# Patient Record
Sex: Female | Born: 1965
Health system: Southern US, Community
[De-identification: ages and names within clinical notes are randomized; demographics above are authoritative.]

## PROBLEM LIST (undated history)

## (undated) DIAGNOSIS — G5600 Carpal tunnel syndrome, unspecified upper limb: Secondary | ICD-10-CM

## (undated) DIAGNOSIS — I10 Essential (primary) hypertension: Secondary | ICD-10-CM

## (undated) DIAGNOSIS — R7301 Impaired fasting glucose: Secondary | ICD-10-CM

## (undated) DIAGNOSIS — N3281 Overactive bladder: Secondary | ICD-10-CM

## (undated) DIAGNOSIS — E559 Vitamin D deficiency, unspecified: Secondary | ICD-10-CM

## (undated) DIAGNOSIS — M109 Gout, unspecified: Secondary | ICD-10-CM

## (undated) DIAGNOSIS — M1990 Unspecified osteoarthritis, unspecified site: Secondary | ICD-10-CM

## (undated) DIAGNOSIS — Z9289 Personal history of other medical treatment: Secondary | ICD-10-CM

## (undated) DIAGNOSIS — F419 Anxiety disorder, unspecified: Secondary | ICD-10-CM

## (undated) DIAGNOSIS — IMO0001 Reserved for inherently not codable concepts without codable children: Secondary | ICD-10-CM

## (undated) DIAGNOSIS — Z973 Presence of spectacles and contact lenses: Secondary | ICD-10-CM

## (undated) HISTORY — DX: Vitamin D deficiency, unspecified: E55.9

## (undated) HISTORY — DX: Impaired fasting glucose: R73.01

## (undated) HISTORY — PX: TOENAIL EXCISION: SUR558

## (undated) HISTORY — DX: Reserved for inherently not codable concepts without codable children: IMO0001

## (undated) HISTORY — DX: Carpal tunnel syndrome, unspecified upper limb: G56.00

## (undated) HISTORY — DX: Personal history of other medical treatment: Z92.89

## (undated) HISTORY — DX: Presence of spectacles and contact lenses: Z97.3

## (undated) HISTORY — DX: Anxiety disorder, unspecified: F41.9

## (undated) HISTORY — PX: WISDOM TOOTH EXTRACTION: SHX21

## (undated) HISTORY — DX: Overactive bladder: N32.81

## (undated) HISTORY — DX: Essential (primary) hypertension: I10

---

## 1997-07-20 ENCOUNTER — Encounter: Admission: RE | Admit: 1997-07-20 | Discharge: 1997-07-20 | Payer: Self-pay | Admitting: Family Medicine

## 1997-08-22 ENCOUNTER — Encounter: Admission: RE | Admit: 1997-08-22 | Discharge: 1997-08-22 | Payer: Self-pay | Admitting: Family Medicine

## 1997-09-20 ENCOUNTER — Encounter: Admission: RE | Admit: 1997-09-20 | Discharge: 1997-09-20 | Payer: Self-pay | Admitting: Family Medicine

## 1997-12-08 ENCOUNTER — Encounter: Admission: RE | Admit: 1997-12-08 | Discharge: 1997-12-08 | Payer: Self-pay | Admitting: Family Medicine

## 1998-01-24 ENCOUNTER — Encounter: Admission: RE | Admit: 1998-01-24 | Discharge: 1998-01-24 | Payer: Self-pay | Admitting: Family Medicine

## 1998-05-30 ENCOUNTER — Encounter: Admission: RE | Admit: 1998-05-30 | Discharge: 1998-05-30 | Payer: Self-pay | Admitting: Family Medicine

## 1998-09-05 ENCOUNTER — Encounter: Admission: RE | Admit: 1998-09-05 | Discharge: 1998-09-05 | Payer: Self-pay | Admitting: Family Medicine

## 1998-10-12 ENCOUNTER — Encounter: Admission: RE | Admit: 1998-10-12 | Discharge: 1998-10-12 | Payer: Self-pay | Admitting: Family Medicine

## 1998-12-17 ENCOUNTER — Encounter: Admission: RE | Admit: 1998-12-17 | Discharge: 1998-12-17 | Payer: Self-pay | Admitting: Family Medicine

## 2001-05-25 ENCOUNTER — Encounter: Admission: RE | Admit: 2001-05-25 | Discharge: 2001-05-25 | Payer: Self-pay | Admitting: Family Medicine

## 2002-12-02 ENCOUNTER — Ambulatory Visit (HOSPITAL_COMMUNITY): Admission: RE | Admit: 2002-12-02 | Discharge: 2002-12-02 | Payer: Self-pay | Admitting: Obstetrics

## 2002-12-02 ENCOUNTER — Encounter: Payer: Self-pay | Admitting: Obstetrics

## 2003-07-22 ENCOUNTER — Emergency Department (HOSPITAL_COMMUNITY): Admission: EM | Admit: 2003-07-22 | Discharge: 2003-07-22 | Payer: Self-pay | Admitting: Emergency Medicine

## 2004-02-26 ENCOUNTER — Emergency Department (HOSPITAL_COMMUNITY): Admission: EM | Admit: 2004-02-26 | Discharge: 2004-02-26 | Payer: Self-pay | Admitting: Emergency Medicine

## 2005-01-02 ENCOUNTER — Ambulatory Visit: Payer: Self-pay | Admitting: Family Medicine

## 2005-01-10 ENCOUNTER — Encounter (INDEPENDENT_AMBULATORY_CARE_PROVIDER_SITE_OTHER): Payer: Self-pay | Admitting: *Deleted

## 2005-01-10 LAB — CONVERTED CEMR LAB

## 2005-06-05 ENCOUNTER — Ambulatory Visit (HOSPITAL_COMMUNITY): Admission: RE | Admit: 2005-06-05 | Discharge: 2005-06-05 | Payer: Self-pay | Admitting: Obstetrics

## 2006-06-05 ENCOUNTER — Encounter (INDEPENDENT_AMBULATORY_CARE_PROVIDER_SITE_OTHER): Payer: Self-pay | Admitting: *Deleted

## 2007-05-19 ENCOUNTER — Ambulatory Visit (HOSPITAL_COMMUNITY): Admission: RE | Admit: 2007-05-19 | Discharge: 2007-05-19 | Payer: Self-pay | Admitting: Obstetrics

## 2008-07-13 ENCOUNTER — Ambulatory Visit (HOSPITAL_COMMUNITY): Admission: RE | Admit: 2008-07-13 | Discharge: 2008-07-13 | Payer: Self-pay | Admitting: Obstetrics

## 2008-10-07 ENCOUNTER — Emergency Department (HOSPITAL_COMMUNITY): Admission: EM | Admit: 2008-10-07 | Discharge: 2008-10-07 | Payer: Self-pay | Admitting: Family Medicine

## 2008-10-20 ENCOUNTER — Ambulatory Visit: Payer: Self-pay | Admitting: Internal Medicine

## 2008-10-25 DIAGNOSIS — I10 Essential (primary) hypertension: Secondary | ICD-10-CM

## 2008-10-25 HISTORY — DX: Essential (primary) hypertension: I10

## 2008-11-08 ENCOUNTER — Ambulatory Visit: Payer: Self-pay | Admitting: Internal Medicine

## 2009-05-18 ENCOUNTER — Ambulatory Visit: Payer: Self-pay | Admitting: Internal Medicine

## 2009-07-17 ENCOUNTER — Ambulatory Visit (HOSPITAL_COMMUNITY): Admission: RE | Admit: 2009-07-17 | Discharge: 2009-07-17 | Payer: Self-pay | Admitting: Obstetrics and Gynecology

## 2009-09-05 ENCOUNTER — Encounter (HOSPITAL_COMMUNITY): Payer: Self-pay | Admitting: Obstetrics and Gynecology

## 2009-09-05 ENCOUNTER — Inpatient Hospital Stay (HOSPITAL_COMMUNITY): Admission: RE | Admit: 2009-09-05 | Discharge: 2009-09-07 | Payer: Self-pay | Admitting: Obstetrics and Gynecology

## 2009-12-03 ENCOUNTER — Telehealth: Payer: Self-pay | Admitting: Internal Medicine

## 2009-12-07 ENCOUNTER — Ambulatory Visit: Payer: Self-pay | Admitting: Internal Medicine

## 2010-04-28 ENCOUNTER — Encounter: Payer: Self-pay | Admitting: Obstetrics

## 2010-05-07 NOTE — Progress Notes (Signed)
Summary: Refill--Lisinopril  Phone Note Refill Request Message from:  Fax from Pharmacy on December 03, 2009 3:44 PM  Refills Requested: Medication #1:  LISINOPRIL-HYDROCHLOROTHIAZIDE 20-12.5 MG TABS once daily for high blood pressure. Initial call taken by: Lucious Groves CMA,  December 03, 2009 3:44 PM    Prescriptions: LISINOPRIL-HYDROCHLOROTHIAZIDE 20-12.5 MG TABS (LISINOPRIL-HYDROCHLOROTHIAZIDE) once daily for high blood pressure  #30 x 0   Entered by:   Lucious Groves CMA   Authorized by:   Etta Grandchild MD   Signed by:   Lucious Groves CMA on 12/03/2009   Method used:   Electronically to        [facility] (retail)       [address]       [place], [place]  [address]       Ph: [phone] or [phone]       Fax: [phone]   RxID:   [id number]

## 2010-05-07 NOTE — Assessment & Plan Note (Signed)
Summary: elev bp / offered sda / cd   Vital Signs:  Patient profile:   45 year old female Menstrual status:  regular Height:      67 inches Weight:      227 pounds BMI:     35.68 O2 Sat:      98 % on Room air Temp:     98.3 degrees F oral Pulse rate:   72 / minute Pulse rhythm:   regular Resp:     16 per minute BP sitting:   139 / 82  (left arm) Cuff size:   large  Vitals Entered By: Bill Salinas CMA (December 07, 2009 8:11 AM)  Nutrition Counseling: Patient's BMI is greater than 25 and therefore counseled on weight management options.  O2 Flow:  Room air CC: pt here for follow up on elevated BP/ ab Comments Pt is due for a tetanus and she needs a refill on her lisinopril/ ab   Primary Care Leslea Vowles:  Etta Grandchild MD  CC:  pt here for follow up on elevated BP/ ab.  History of Present Illness:  Hypertension Follow-Up      This is a 45 year old woman who presents for Hypertension follow-up.  The patient denies lightheadedness, urinary frequency, headaches, edema, rash, and fatigue.  The patient denies the following associated symptoms: chest pain, chest pressure, exercise intolerance, dyspnea, palpitations, syncope, leg edema, and pedal edema.  Compliance with medications (by patient report) has been near 100%.  The patient reports that dietary compliance has been good.  The patient reports exercising occasionally.  Adjunctive measures currently used by the patient include salt restriction and relaxation.    Preventive Screening-Counseling & Management  Alcohol-Tobacco     Alcohol drinks/day: 0     Smoking Status: never     Tobacco Counseling: not indicated; no tobacco use  Hep-HIV-STD-Contraception     Hepatitis Risk: no risk noted     HIV Risk: no risk noted     STD Risk: no risk noted  Medications Prior to Update: 1)  Lisinopril-Hydrochlorothiazide 20-12.5 Mg Tabs (Lisinopril-Hydrochlorothiazide) .... Once Daily For High Blood Pressure  Current Medications  (verified): 1)  Lisinopril-Hydrochlorothiazide 20-12.5 Mg Tabs (Lisinopril-Hydrochlorothiazide) .... Once Daily For High Blood Pressure  Allergies (verified): No Known Drug Allergies  Past History:  Past Medical History: Last updated: 10/20/2008 1) hx of gonorrhea, 2) hx of BV and trichomonas, 2/03, 3? 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 dad: HTN, gout, coagulopathy, arrythmia, GF - lung CA (smoking), GM - CVA  Social History: Reviewed history from 10/20/2008 and no changes required. 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  The patient denies anorexia, fever, weight loss, chest pain, peripheral edema, prolonged cough, abdominal pain, hematuria, difficulty walking, and depression.    Physical Exam  General:  alert, well-developed, well-nourished, well-hydrated, healthy-appearing, cooperative to examination, good hygiene, and overweight-appearing.   Head:  normocephalic and atraumatic.   Mouth:  good dentition, no gingival abnormalities, no dental plaque, and pharynx pink and moist.   Neck:  supple, full ROM, no masses, no thyromegaly, normal carotid upstroke, and no carotid bruits.   Lungs:  Normal respiratory effort, chest expands  symmetrically. Lungs are clear to auscultation, no crackles or wheezes. Heart:  Normal rate and regular rhythm. S1 and S2 normal without gallop, murmur, click, rub or other extra sounds. Abdomen:  Bowel sounds positive,abdomen soft and non-tender without masses, organomegaly or hernias noted. Msk:  No deformity or scoliosis noted of thoracic or lumbar spine.   Pulses:  R and L carotid,radial,femoral,dorsalis pedis and posterior tibial pulses are full and equal bilaterally Extremities:  1+ left pedal edema and 1+ right pedal edema.   Neurologic:  No cranial nerve deficits noted. Station and gait are normal. Plantar reflexes are down-going bilaterally. DTRs are symmetrical throughout. Sensory, motor and coordinative functions appear intact. Skin:  Intact without suspicious lesions or rashes Psych:  Cognition and judgment appear intact. Alert and cooperative with normal attention span and concentration. No apparent delusions, illusions, hallucinations   Impression & Recommendations:  Problem # 1:  HYPERTENSION (ICD-401.9) Assessment Unchanged  Her updated medication list for this problem includes:    Lisinopril-hydrochlorothiazide 20-12.5 Mg Tabs (Lisinopril-hydrochlorothiazide) ..... Once daily for high blood pressure  BP today: 139/82 Prior BP: 130/84 (11/08/2008)  Prior 10 Yr Risk Heart Disease: Not enough information (11/08/2008)  Complete Medication List: 1)  Lisinopril-hydrochlorothiazide 20-12.5 Mg Tabs (Lisinopril-hydrochlorothiazide) .... Once daily for high blood pressure  Patient Instructions: 1)  Please schedule a follow-up appointment in 4 months. 2)  Limit your Sodium (Salt) to less than 4 grams a day (slightly less than 1 teaspoon) to prevent fluid retention, swelling, or worsening or symptoms. 3)  It is important that you exercise regularly at least 20 minutes 5 times a week. If you develop chest pain, have severe difficulty breathing, or feel very tired , stop exercising  immediately and seek medical attention. 4)  You need to lose weight. Consider a lower calorie diet and regular exercise.  5)  Check your Blood Pressure regularly. If it is above 140/90: you should make an appointment. Prescriptions: LISINOPRIL-HYDROCHLOROTHIAZIDE 20-12.5 MG TABS (LISINOPRIL-HYDROCHLOROTHIAZIDE) once daily for high blood pressure  #30 x 11   Entered and Authorized by:   Etta Grandchild MD   Signed by:   Etta Grandchild MD on 12/07/2009   Method used:   Electronically to        [facility] (retail)       [address]       [place], [place]  [address]       Ph: [phone] or [phone]       Fax: [phone]   RxID:   [id number]

## 2010-05-22 ENCOUNTER — Ambulatory Visit (INDEPENDENT_AMBULATORY_CARE_PROVIDER_SITE_OTHER): Payer: BC Managed Care – PPO | Admitting: Internal Medicine

## 2010-05-22 ENCOUNTER — Encounter: Payer: Self-pay | Admitting: Internal Medicine

## 2010-05-22 DIAGNOSIS — M25539 Pain in unspecified wrist: Secondary | ICD-10-CM | POA: Insufficient documentation

## 2010-05-22 DIAGNOSIS — G5600 Carpal tunnel syndrome, unspecified upper limb: Secondary | ICD-10-CM

## 2010-05-22 DIAGNOSIS — I10 Essential (primary) hypertension: Secondary | ICD-10-CM

## 2010-05-22 HISTORY — DX: Carpal tunnel syndrome, unspecified upper limb: G56.00

## 2010-05-29 NOTE — Assessment & Plan Note (Signed)
Summary: ARMS AND LEGS PAIN  -D/T---STC   Vital Signs:  Patient profile:   44 year old female Menstrual status:  hysterectomy Height:      66.50 inches Weight:      229 pounds BMI:     36.54 O2 Sat:      97 % on Room air Temp:     98.0 degrees F oral Pulse rate:   83 / minute Pulse rhythm:   regular Resp:     16 per minute BP sitting:   128 / 68  (left arm)  Vitals Entered By: Burnard Leigh CMA(AAMA) (May 22, 2010 2:40 PM)  Nutrition Counseling: Patient's BMI is greater than 25 and therefore counseled on weight management options.  O2 Flow:  Room air CC: Pt here to discuss pian in Arms, Hypertension Management Is Patient Diabetic? No Pain Assessment Patient in pain? no          Menstrual Status hysterectomy Last PAP Result Done.   Primary Care Provider:  Etta Grandchild MD  CC:  Pt here to discuss pian in Arms and Hypertension Management.  History of Present Illness: She returns c/o a longstanding history of bilateral wrist pain that radiates up both arms. It started years ago when she did a lot of typing and has worsened recently with lifting and repetitive  rolling motions at work.  Hypertension History:      She denies headache, chest pain, palpitations, dyspnea with exertion, orthopnea, PND, peripheral edema, visual symptoms, neurologic problems, syncope, and side effects from treatment.  She notes no problems with any antihypertensive medication side effects.        Positive major cardiovascular risk factors include hypertension.  Negative major cardiovascular risk factors include female age less than 67 years old, no history of diabetes or hyperlipidemia, negative family history for ischemic heart disease, and non-tobacco-user status.        Further assessment for target organ damage reveals no history of ASHD, cardiac end-organ damage (CHF/LVH), stroke/TIA, peripheral vascular disease, renal insufficiency, or hypertensive retinopathy.     Preventive  Screening-Counseling & Management  Alcohol-Tobacco     Alcohol drinks/day: 0     Alcohol Counseling: not indicated; patient does not drink     Smoking Status: never     Tobacco Counseling: not indicated; no tobacco use  Hep-HIV-STD-Contraception     Hepatitis Risk: no risk noted     HIV Risk: no risk noted     STD Risk: no risk noted      Drug Use:  no.    Clinical Review Panels:  Prevention   Last Pap Smear:  Done. (01/10/2005)   Medications Prior to Update: 1)  Lisinopril-Hydrochlorothiazide 20-12.5 Mg Tabs (Lisinopril-Hydrochlorothiazide) .... Once Daily For High Blood Pressure  Current Medications (verified): 1)  Lisinopril-Hydrochlorothiazide 20-12.5 Mg Tabs (Lisinopril-Hydrochlorothiazide) .... Once Daily For High Blood Pressure 2)  Vicodin 5-500 Mg Tabs (Hydrocodone-Acetaminophen) .... Take As Needed For Pain Post Foot Sx 3)  Cortisporin 3.5-10000-1 Soln (Neomycin-Polymyxin-Hc) .... As Directed  Allergies (verified): 1)  ! * Cillins  Past History:  Past Medical History: Last updated: 10/20/2008 1) hx of gonorrhea, 2) hx of BV and trichomonas, 2/03, 3? 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 dad: HTN, gout, coagulopathy, arrythmia, GF - lung CA (smoking), GM - CVA  Social History: Reviewed history from 10/20/2008 and no changes required. 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   MS:  Complains of joint pain, muscle aches, and stiffness; denies joint redness and joint swelling. Neuro:  Denies brief paralysis, disturbances in coordination, numbness, tingling, tremors, and weakness.  Physical Exam  General:  alert, well-developed, well-nourished, well-hydrated, healthy-appearing, cooperative to examination, good hygiene, and overweight-appearing.   Head:  normocephalic and atraumatic.   Mouth:  good dentition, no gingival abnormalities, no dental plaque, and pharynx pink and moist.   Neck:  supple, full ROM, no masses, no thyromegaly, normal carotid upstroke, and no carotid bruits.   Lungs:  Normal respiratory effort, chest expands symmetrically. Lungs are clear to auscultation, no crackles or wheezes. Heart:  Normal rate and regular rhythm. S1 and S2 normal without gallop, murmur, click, rub or other extra sounds. Abdomen:  Bowel sounds positive,abdomen soft and non-tender without masses, organomegaly or hernias noted. Msk:  negative Tinel's and Phalen's tests on both sides. normal ROM, no joint tenderness, no joint swelling, no joint warmth, no redness over joints, no joint deformities, no joint instability, no crepitation, and no muscle atrophy.   Pulses:  R and L carotid,radial,femoral,dorsalis pedis and posterior tibial pulses are full and equal bilaterally Extremities:  No clubbing, cyanosis, edema, or deformity noted with normal full range of motion of all joints.   Neurologic:  No cranial nerve deficits noted. Station and gait are normal. Plantar reflexes are down-going bilaterally. DTRs are symmetrical throughout. Sensory, motor and coordinative functions appear intact. Skin:  Intact without  suspicious lesions or rashes Cervical Nodes:  No lymphadenopathy noted Psych:  Cognition and judgment appear intact. Alert and cooperative with normal attention span and concentration. No apparent delusions, illusions, hallucinations   Impression & Recommendations:  Problem # 1:  CARPAL TUNNEL SYNDROME, BILATERAL (ICD-354.0) Assessment New bilateral wrist splints were given today with instructions to wear them as much as possble, especially at night Orders: EMR Electrical engineer Code [facility]) Neurology Referral (Neuro)  Problem # 2:  WRIST PAIN, BILATERAL (ICD-719.43)  Orders: EMR Misc Charge Code [facility]) Neurology Referral (Neuro)  Problem # 3:  HYPERTENSION (ICD-401.9) Assessment: Improved  Her updated medication list for this problem includes:    Lisinopril-hydrochlorothiazide 20-12.5 Mg Tabs (Lisinopril-hydrochlorothiazide) ..... Once daily for high blood pressure  BP today: 128/68 Prior BP: 139/82 (12/07/2009)  Prior 10 Yr Risk Heart Disease: Not enough information (11/08/2008)  Complete Medication List: 1)  Lisinopril-hydrochlorothiazide 20-12.5 Mg Tabs (Lisinopril-hydrochlorothiazide) .... Once daily for high blood pressure 2)  Vicodin 5-500 Mg Tabs (Hydrocodone-acetaminophen) .... Take as needed for pain post foot sx 3)  Cortisporin 3.5-10000-1 Soln (Neomycin-polymyxin-hc) .... As directed  Hypertension Assessment/Plan:      The patient's hypertensive risk group is category A: No risk factors and no target organ damage.  Today's blood pressure is 128/68.  Her blood pressure goal is < 140/90.  Patient Instructions: 1)  Please schedule a follow-up appointment in 1 month. 2)  It  is important that you exercise regularly at least 20 minutes 5 times a week. If you develop chest pain, have severe difficulty breathing, or feel very tired , stop exercising immediately and seek medical attention. 3)  You need to lose weight. Consider a lower calorie diet and regular exercise.   4)  Check your Blood Pressure regularly. If it is above 130/80: you should make an appointment. 5)  Take 650-1000mg  of Tylenol every 4-6 hours as needed for relief of pain or comfort of fever AVOID taking more than 4000mg   in a 24 hour period (can cause liver damage in higher doses). 6)  Take 400-600mg  of Ibuprofen (Advil, Motrin) with food every 4-6 hours as needed for relief of pain or comfort of fever.   Orders Added: 1)  EMR Misc Charge Code [EMRMisc] 2)  Neurology Referral [Neuro] 3)  Est. Patient Level IV [62694]

## 2010-06-10 ENCOUNTER — Other Ambulatory Visit (HOSPITAL_COMMUNITY): Payer: Self-pay | Admitting: Obstetrics and Gynecology

## 2010-06-10 DIAGNOSIS — Z1231 Encounter for screening mammogram for malignant neoplasm of breast: Secondary | ICD-10-CM

## 2010-06-19 ENCOUNTER — Encounter: Payer: Self-pay | Admitting: Internal Medicine

## 2010-06-24 LAB — CBC
HCT: 35 % — ABNORMAL LOW (ref 36.0–46.0)
Hemoglobin: 10.4 g/dL — ABNORMAL LOW (ref 12.0–15.0)
Hemoglobin: 11.8 g/dL — ABNORMAL LOW (ref 12.0–15.0)
MCHC: 34.4 g/dL (ref 30.0–36.0)
Platelets: 277 10*3/uL (ref 150–400)
RBC: 4.05 MIL/uL (ref 3.87–5.11)
RDW: 15.1 % (ref 11.5–15.5)
RDW: 15.1 % (ref 11.5–15.5)
WBC: 5 10*3/uL (ref 4.0–10.5)

## 2010-06-24 LAB — COMPREHENSIVE METABOLIC PANEL
ALT: 12 U/L (ref 0–35)
Alkaline Phosphatase: 56 U/L (ref 39–117)
BUN: 8 mg/dL (ref 6–23)
CO2: 27 mEq/L (ref 19–32)
Chloride: 106 mEq/L (ref 96–112)
Glucose, Bld: 90 mg/dL (ref 70–99)
Potassium: 3.7 mEq/L (ref 3.5–5.1)
Sodium: 137 mEq/L (ref 135–145)
Total Bilirubin: 0.1 mg/dL — ABNORMAL LOW (ref 0.3–1.2)

## 2010-06-24 LAB — TYPE AND SCREEN
ABO/RH(D): B NEG
Antibody Screen: NEGATIVE

## 2010-06-24 LAB — PREGNANCY, URINE: Preg Test, Ur: NEGATIVE

## 2010-07-15 LAB — POCT I-STAT, CHEM 8
BUN: 8 mg/dL (ref 6–23)
Calcium, Ion: 1.19 mmol/L (ref 1.12–1.32)
Chloride: 102 mEq/L (ref 96–112)
Creatinine, Ser: 0.8 mg/dL (ref 0.4–1.2)
TCO2: 26 mmol/L (ref 0–100)

## 2010-07-22 ENCOUNTER — Ambulatory Visit (HOSPITAL_COMMUNITY)
Admission: RE | Admit: 2010-07-22 | Discharge: 2010-07-22 | Disposition: A | Discharge status: Home / Self Care | Payer: BC Managed Care – PPO | Source: Ambulatory Visit | Attending: Obstetrics and Gynecology | Admitting: Obstetrics and Gynecology

## 2010-07-22 DIAGNOSIS — Z1231 Encounter for screening mammogram for malignant neoplasm of breast: Secondary | ICD-10-CM | POA: Insufficient documentation

## 2010-08-23 ENCOUNTER — Encounter: Payer: Self-pay | Admitting: Internal Medicine

## 2010-08-23 ENCOUNTER — Ambulatory Visit (INDEPENDENT_AMBULATORY_CARE_PROVIDER_SITE_OTHER): Payer: BC Managed Care – PPO | Admitting: Internal Medicine

## 2010-08-23 VITALS — BP 112/70 | HR 93 | Temp 99.2°F | Ht 67.0 in | Wt 231.1 lb

## 2010-08-23 DIAGNOSIS — Z0000 Encounter for general adult medical examination without abnormal findings: Secondary | ICD-10-CM

## 2010-08-23 DIAGNOSIS — I10 Essential (primary) hypertension: Secondary | ICD-10-CM

## 2010-08-23 DIAGNOSIS — J019 Acute sinusitis, unspecified: Secondary | ICD-10-CM

## 2010-08-23 MED ORDER — AMOXICILLIN 500 MG PO CAPS: 500.0000 mg | ORAL_CAPSULE | Freq: Three times a day (TID) | ORAL | Status: AC

## 2010-08-23 NOTE — Patient Instructions (Signed)
Take all new medications as prescribed Continue all other medications as before Please see your dentist about the 3 cracked teeth

## 2010-08-24 ENCOUNTER — Encounter: Payer: Self-pay | Admitting: Internal Medicine

## 2010-08-24 DIAGNOSIS — J329 Chronic sinusitis, unspecified: Secondary | ICD-10-CM | POA: Insufficient documentation

## 2010-08-24 DIAGNOSIS — J019 Acute sinusitis, unspecified: Secondary | ICD-10-CM | POA: Insufficient documentation

## 2010-08-24 DIAGNOSIS — Z0000 Encounter for general adult medical examination without abnormal findings: Secondary | ICD-10-CM | POA: Insufficient documentation

## 2010-08-24 NOTE — Progress Notes (Signed)
  Subjective:    Patient ID: Kimberly Underwood, female    DOB: October 17, 1965, 45 y.o.   MRN: [id number]  HPI   Here with 3 days acute onset fever, facial pain, pressure, general weakness and malaise, and greenish d/c, with slight ST, but little to no cough and Pt denies chest pain, increased sob or doe, wheezing, orthopnea, PND, increased LE swelling, palpitations, dizziness or syncope.  Also with 3 cracked teeth plans to see dental soon.  Has several tender LA to mid right neck onset same as sinus symptoms, as well as myalgias.  Pt denies new neurological symptoms such as new headache, or facial or extremity weakness or numbness   Pt denies polydipsia, polyuria.  Pt states overall good compliance with meds, trying to follow lower cholesterol  diet, wt overall stable     Past Medical History  Diagnosis Date  . CARPAL TUNNEL SYNDROME, BILATERAL 05/22/2010  . HYPERTENSION 10/25/2008  . WRIST PAIN, BILATERAL 05/22/2010   Past Surgical History  Procedure Date  . Abdominal hysterectomy     reports that she has never smoked. She does not have any smokeless tobacco history on file. She reports that she does not drink alcohol or use illicit drugs. family history includes Cancer in her maternal grandfather; Clotting disorder in her father; Gout in her father; and Hypertension in her father. No Known Allergies Current Outpatient Prescriptions on File Prior to Visit  Medication Sig Dispense Refill  . lisinopril-hydrochlorothiazide (PRINZIDE,ZESTORETIC) 20-12.5 MG per tablet Take 1 tablet by mouth daily. For high blood pressure        Review of Systems All otherwise neg per pt     Objective:   Physical Exam BP 112/70  Pulse 93  Temp(Src) 99.2 F (37.3 C) (Oral)  Ht 5\' 7"  (1.702 m)  Wt 231 lb 2 oz (104.838 kg)  BMI 36.20 kg/m2  SpO2 97% Physical Exam  VS noted Constitutional: Pt appears well-developed and well-nourished.  HENT: Head: Normocephalic.  Right Ear: External ear normal.  Left Ear:  External ear normal.  Bilat tm's mild erythema.  Sinus tender.  Pharynx mild erythema Eyes: Conjunctivae and EOM are normal. Pupils are equal, round, and reactive to light.  Mouth with 3 cracked teeth, no abscess Neck: Normal range of motion. Neck supple. Right mid neck with small tender LA mid preright SCM chain Cardiovascular: Normal rate and regular rhythm.   Pulmonary/Chest: Effort normal and breath sounds normal.  Abd:  Soft, NT, non-distended, + BS Neurological: Pt is alert. No cranial nerve deficit.  Skin: Skin is warm. No erythema.  Psychiatric: Pt behavior is normal. Thought content normal.         Assessment & Plan:

## 2010-08-24 NOTE — Assessment & Plan Note (Signed)
stable overall by hx and exam, most recent lab reviewed with pt, and pt to continue medical treatment as before  BP Readings from Last 3 Encounters:  08/23/10 112/70  05/22/10 128/68  12/07/09 139/82

## 2010-08-24 NOTE — Assessment & Plan Note (Signed)
midl to mod, for antibx course,  to f/u any worsening symptoms or concerns

## 2010-09-06 HISTORY — PX: ABDOMINAL HYSTERECTOMY: SHX81

## 2011-01-11 ENCOUNTER — Other Ambulatory Visit: Payer: Self-pay | Admitting: Internal Medicine

## 2011-01-27 ENCOUNTER — Telehealth: Payer: Self-pay

## 2011-01-27 MED ORDER — LISINOPRIL-HYDROCHLOROTHIAZIDE 20-12.5 MG PO TABS: 1.0000 | ORAL_TABLET | Freq: Every day | ORAL | Status: DC

## 2011-01-27 NOTE — Telephone Encounter (Signed)
Patient completely out of medication, given 1 fill and schedule appt 01/29/11

## 2011-01-29 ENCOUNTER — Encounter: Payer: Self-pay | Admitting: Internal Medicine

## 2011-01-29 ENCOUNTER — Ambulatory Visit (INDEPENDENT_AMBULATORY_CARE_PROVIDER_SITE_OTHER): Payer: BC Managed Care – PPO | Admitting: Internal Medicine

## 2011-01-29 VITALS — BP 144/86 | HR 80 | Temp 98.2°F | Resp 16 | Wt 237.0 lb

## 2011-01-29 DIAGNOSIS — I10 Essential (primary) hypertension: Secondary | ICD-10-CM

## 2011-01-29 MED ORDER — LISINOPRIL-HYDROCHLOROTHIAZIDE 20-12.5 MG PO TABS: 1.0000 | ORAL_TABLET | Freq: Every day | ORAL | Status: DC

## 2011-01-29 NOTE — Patient Instructions (Signed)

## 2011-01-29 NOTE — Progress Notes (Signed)
  Subjective:    Patient ID: Kimberly Underwood, female    DOB: Jan 10, 1966, 45 y.o.   MRN: [id number]  Hypertension This is a chronic problem. The current episode started more than 1 year ago. The problem has been gradually worsening since onset. The problem is uncontrolled. Pertinent negatives include no anxiety, blurred vision, chest pain, headaches, malaise/fatigue, neck pain, orthopnea, palpitations, peripheral edema, PND, shortness of breath or sweats. There are no associated agents to hypertension. Past treatments include nothing. Compliance problems include exercise, psychosocial issues and diet (she has been out of meds for 3 weeks).       Review of Systems  Constitutional: Negative for fever, chills, malaise/fatigue, diaphoresis, activity change, appetite change, fatigue and unexpected weight change.  HENT: Negative for facial swelling, trouble swallowing, neck pain, neck stiffness and voice change.   Eyes: Negative.  Negative for blurred vision.  Respiratory: Negative for apnea, cough, choking, chest tightness, shortness of breath, wheezing and stridor.   Cardiovascular: Negative for chest pain, palpitations, orthopnea, leg swelling and PND.  Gastrointestinal: Negative for nausea, vomiting, abdominal pain, diarrhea, constipation and anal bleeding.  Genitourinary: Negative for dysuria, urgency, frequency, hematuria, flank pain, enuresis, difficulty urinating and dyspareunia.  Musculoskeletal: Negative.   Skin: Negative for color change, pallor, rash and wound.  Neurological: Negative for dizziness, tremors, seizures, syncope, facial asymmetry, speech difficulty, weakness, light-headedness, numbness and headaches.  Hematological: Negative for adenopathy. Does not bruise/bleed easily.  Psychiatric/Behavioral: Negative.        Objective:   Physical Exam  Vitals reviewed. Constitutional: She is oriented to person, place, and time. She appears well-developed and well-nourished. No  distress.  HENT:  Head: Normocephalic and atraumatic.  Mouth/Throat: Oropharynx is clear and moist. No oropharyngeal exudate.  Eyes: Conjunctivae are normal. Right eye exhibits no discharge. Left eye exhibits no discharge. No scleral icterus.  Neck: Normal range of motion. Neck supple. No JVD present. No tracheal deviation present. No thyromegaly present.  Cardiovascular: Normal rate, regular rhythm, normal heart sounds and intact distal pulses.  Exam reveals no gallop and no friction rub.   No murmur heard. Pulmonary/Chest: Effort normal and breath sounds normal. No stridor. No respiratory distress. She has no wheezes. She has no rales. She exhibits no tenderness.  Abdominal: Soft. Bowel sounds are normal. She exhibits no distension and no mass. There is no tenderness. There is no rebound and no guarding.  Musculoskeletal: Normal range of motion. She exhibits no edema and no tenderness.  Lymphadenopathy:    She has no cervical adenopathy.  Neurological: She is oriented to person, place, and time. She displays normal reflexes. No cranial nerve deficit. She exhibits normal muscle tone. Coordination normal.  Skin: Skin is warm and dry. No rash noted. She is not diaphoretic. No erythema. No pallor.  Psychiatric: She has a normal mood and affect. Her behavior is normal. Judgment and thought content normal.      Lab Results  Component Value Date   WBC 7.7 09/06/2009   HGB 10.4* 09/06/2009   HCT 30.2* 09/06/2009   PLT 277 09/06/2009   GLUCOSE 90 09/04/2009   ALT 12 09/04/2009   AST 15 09/04/2009   NA 137 09/04/2009   K 3.7 09/04/2009   CL 106 09/04/2009   CREATININE 0.67 09/04/2009   BUN 8 09/04/2009   CO2 27 09/04/2009      Assessment & Plan:

## 2011-01-29 NOTE — Assessment & Plan Note (Signed)
Restart prinzide

## 2011-03-16 ENCOUNTER — Other Ambulatory Visit: Payer: Self-pay | Admitting: Internal Medicine

## 2011-04-15 ENCOUNTER — Other Ambulatory Visit: Payer: Self-pay | Admitting: Internal Medicine

## 2011-05-22 ENCOUNTER — Other Ambulatory Visit: Payer: Self-pay | Admitting: Internal Medicine

## 2011-05-26 ENCOUNTER — Encounter: Payer: BC Managed Care – PPO | Admitting: Family Medicine

## 2011-05-28 ENCOUNTER — Ambulatory Visit: Payer: BC Managed Care – PPO | Admitting: Internal Medicine

## 2011-06-04 ENCOUNTER — Encounter: Payer: BC Managed Care – PPO | Admitting: Family Medicine

## 2011-06-09 ENCOUNTER — Encounter: Payer: Self-pay | Admitting: Medical

## 2011-06-09 ENCOUNTER — Ambulatory Visit (INDEPENDENT_AMBULATORY_CARE_PROVIDER_SITE_OTHER): Payer: BC Managed Care – PPO | Admitting: Medical

## 2011-06-09 VITALS — BP 130/82 | HR 82 | Temp 98.4°F | Resp 16 | Ht 68.0 in | Wt 234.0 lb

## 2011-06-09 DIAGNOSIS — Z23 Encounter for immunization: Secondary | ICD-10-CM

## 2011-06-09 DIAGNOSIS — M6281 Muscle weakness (generalized): Secondary | ICD-10-CM

## 2011-06-09 DIAGNOSIS — R609 Edema, unspecified: Secondary | ICD-10-CM

## 2011-06-09 DIAGNOSIS — R5381 Other malaise: Secondary | ICD-10-CM

## 2011-06-09 DIAGNOSIS — R5383 Other fatigue: Secondary | ICD-10-CM

## 2011-06-09 DIAGNOSIS — Z0000 Encounter for general adult medical examination without abnormal findings: Secondary | ICD-10-CM

## 2011-06-09 DIAGNOSIS — Z1239 Encounter for other screening for malignant neoplasm of breast: Secondary | ICD-10-CM

## 2011-06-09 DIAGNOSIS — R0602 Shortness of breath: Secondary | ICD-10-CM

## 2011-06-09 DIAGNOSIS — N898 Other specified noninflammatory disorders of vagina: Secondary | ICD-10-CM

## 2011-06-09 LAB — POCT URINALYSIS DIPSTICK
Bilirubin, UA: NEGATIVE
Ketones, UA: NEGATIVE
Leukocytes, UA: NEGATIVE
Protein, UA: NEGATIVE
Spec Grav, UA: 1.02
pH, UA: 5

## 2011-06-09 MED ORDER — FLUCONAZOLE 150 MG PO TABS: 150.0000 mg | ORAL_TABLET | Freq: Once | ORAL | Status: AC

## 2011-06-09 NOTE — Patient Instructions (Signed)

## 2011-06-09 NOTE — Progress Notes (Signed)
Subjective:   HPI  Kimberly Underwood is a 46 y.o. female who presents for a complete physical.  Was seeing [facility] across from [facility], Dr. Yetta Barre, but our office is very close to her home.  She wants to transfer care.   She is treated for HTN.  Checks somewhat regularly.  Usually 120/80s.  Is compliant with medication.   Last eye doctor visit 2011, last dental visit 2012, has April upcoming appointment.   Last tetanus booster unknown.  Normally gets flu shot yearly including fall 2012.  Last mammogram 2012, April.  Last pap 2012, through [facility].   She has some concerns. Lately she has been having some pain along left ear and jaw.  Worse with cold air.  Denies any recent runny nose, cough, sneezing.  No recent swimming.  This just started recently a week ago.    She notes in general, gets muscle fatigue easily for the last month or two.  Feels like she can't hold arms above her head for long period of time, but legs feel weak at times too.  Feels like her lower legs swell frequently.    Reviewed their medical, surgical, family, social, medication, and allergy history and updated chart as appropriate.  Past Medical History  Diagnosis Date  . CARPAL TUNNEL SYNDROME, BILATERAL 05/22/2010  . HYPERTENSION 10/25/2008  . WRIST PAIN, BILATERAL 05/22/2010  . Dental caries     Past Surgical History  Procedure Date  . Abdominal hysterectomy 09/2010    fibroid, partial hysterectomy   . Wisdom tooth extraction   . Toenail excision     Family History  Problem Relation Age of Onset  . Hypertension Father   . Gout Father   . Clotting disorder Father   . Stroke Father   . Lung disease Father     listeria  . Cancer Maternal Grandfather     lung cancer (smoking)  . Cancer Mother     breast cancer  . Migraines Sister   . Heart disease Daughter     heart murmur  . Diabetes Neg Hx     History   Social History  . Marital Status: Single    Spouse Name: N/A     Number of Children: N/A  . Years of Education: N/A   Occupational History  . Quality Control   . 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 Smoking status: Never Smoker   . Smokeless tobacco: Not on file  . Alcohol Use: No  . Drug Use: No  . Sexually Active: Not on file   Other Topics Concern  . Not on file   Social History Narrative   Single, 2 children, ages 23yo son, daughter age 68yo, exercise - none    Current Outpatient Prescriptions on File Prior to Visit  Medication Sig Dispense Refill  . lisinopril-hydrochlorothiazide (PRINZIDE,ZESTORETIC) 20-12.5 MG per tablet Take 1 tablet by mouth daily. For high blood pressure  90 tablet  3  . DISCONTD: lisinopril-hydrochlorothiazide (PRINZIDE,ZESTORETIC) 20-12.5 MG per tablet TAKE 1 TABLET BY MOUTH DAILY FOR HIGH BLOOD PRESSURE  30 tablet  0    No Known Allergies   Review of Systems Constitutional: denies fever, chills, sweats, unexpected weight change, anorexia, +fatigue Allergy: negative; denies recent sneezing, itching, congestion Dermatology: denies changing moles, rash, lumps, new worrisome lesions ENT: no runny nose, ear pain, sore throat, hoarseness, sinus pain, teeth pain, tinnitus, hearing loss, epistaxis Cardiology: denies chest pain,  palpitations, edema, orthopnea, paroxysmal nocturnal dyspnea Respiratory: +occasional shortness of breath.  Denies cough, dyspnea on exertion, wheezing, hemoptysis Gastroenterology: +gas intermittently; denies abdominal pain, nausea, vomiting, diarrhea, constipation, blood in stool, changes in bowel movement, dysphagia Hematology: denies bleeding or bruising problems Musculoskeletal: denies arthralgias, myalgias, joint swelling, back pain, neck pain, cramping, gait changes Ophthalmology: denies vision changes, eye redness, itching, discharge Urology: +urinary frequency; denies dysuria, difficulty urinating, hematuria, urgency, incontinence Neurology: +occasional tingling  in right hands, hx/o CTS; no headache, weakness, numbness, speech abnormality, memory loss, falls, dizziness Psychology: +occasional sleep problems; denies depressed mood, agitation     Objective:   Physical Exam  Filed Vitals:   06/09/11 1441  BP: 130/82  Pulse: 82  Temp: 98.4 F (36.9 C)  Resp: 16    General appearance: alert, no distress, WD/WN, obese black female Skin: few scattered benign appearing macules, no worrisome lesions HEENT: normocephalic, conjunctiva/corneas normal, sclerae anicteric, PERRLA, EOMi, nares patent, no discharge or erythema, pharynx normal Oral cavity: MMM, tongue normal, several molars upper and lower with obvious decay Neck: supple, no lymphadenopathy, possibly mild generalized thyromegaly suggestive of possible goiter, but body habitus makes exam difficult, no masses, normal ROM, no bruits Chest: non tender, normal shape and expansion Heart: RRR, normal S1, S2, no murmurs Lungs: CTA bilaterally, no wheezes, rhonchi, or rales Abdomen: +bs, soft, non tender, non distended, no masses, no hepatomegaly, no splenomegaly, no bruits Back: non tender, normal ROM, no scoliosis Musculoskeletal: upper extremities non tender, no obvious deformity, normal ROM throughout, lower extremities non tender, no obvious deformity, normal ROM throughout Extremities: no edema, no cyanosis, no clubbing Pulses: 2+ symmetric, upper and lower extremities, normal cap refill Neurological: alert, oriented x 3, CN2-12 intact, strength normal upper extremities and lower extremities, sensation normal throughout, DTRs 2+ throughout, no cerebellar signs, gait normal Psychiatric: normal affect, behavior normal, pleasant  Breast: nontender, no masses or lumps, no skin changes, no nipple discharge or inversion, no axillary lymphadenopathy Gyn: Normal external genitalia without lesions, vagina with normal mucosa, mild white thick vaginal discharge.  Uterus and adnexa not enlarged, nontender,  no masses.  Wet prep swab taken.  Exam chaperoned by nurse. Rectal: deferred   Adult ECG Report  Indication: fatigue, SOB  Rate: 71 bpm  Rhythm: normal sinus rhythm  QRS Axis: 18 degrees  PR Interval: 190 ms  QRS Duration: 82 ms  QTc: 413 ms  Conduction Disturbances: questionable atrial enlargement  Other Abnormalities: poor R wave progression  Patient's cardiac risk factors are: hypertension, obesity (BMI >= 30 kg/m2) and sedentary lifestyle.  EKG comparison: none  Narrative Interpretation: borderline atrial enlargement, otherwise normal EKG     Assessment and Plan :    Encounter Diagnoses  Name Primary?  . Routine general medical examination at a health care facility   . Screening for breast cancer   . Muscle weakness   . Edema   . Fatigue   . Need for Tdap vaccination Yes  . Shortness of breath   . Vaginal Discharge    Physical exam - discussed healthy lifestyle, diet, exercise, preventative care, vaccinations, and addressed their concerns.  Handout given.  She will go for mammogram 07/2011.  Muscle weakness - labs today  Edema - labs today, none obvious on exam  Fatigue - labs today  Tdap given today, VIS and vaccine counseling given today.  SOB - likely deconditioning.  EKG today as noted above.   Discussed need for diet, exercise and lifestyle changes to lose weight.  Vaginal discharge - wet prep and KOH prep with yeast, but no bacteria, clue cells, or trich.  Script for Diflucan sent.  Follow-up pending labs.

## 2011-06-11 ENCOUNTER — Other Ambulatory Visit: Payer: BC Managed Care – PPO

## 2011-06-11 DIAGNOSIS — R5383 Other fatigue: Secondary | ICD-10-CM

## 2011-06-11 DIAGNOSIS — M6281 Muscle weakness (generalized): Secondary | ICD-10-CM

## 2011-06-11 DIAGNOSIS — R609 Edema, unspecified: Secondary | ICD-10-CM

## 2011-06-11 DIAGNOSIS — Z0000 Encounter for general adult medical examination without abnormal findings: Secondary | ICD-10-CM

## 2011-06-11 LAB — CBC WITH DIFFERENTIAL/PLATELET
Basophils Absolute: 0 10*3/uL (ref 0.0–0.1)
Lymphocytes Relative: 38 % (ref 12–46)
Lymphs Abs: 2.1 10*3/uL (ref 0.7–4.0)
Neutro Abs: 3.1 10*3/uL (ref 1.7–7.7)
Neutrophils Relative %: 56 % (ref 43–77)
Platelets: 403 10*3/uL — ABNORMAL HIGH (ref 150–400)
RBC: 4.54 MIL/uL (ref 3.87–5.11)
RDW: 13.6 % (ref 11.5–15.5)
WBC: 5.6 10*3/uL (ref 4.0–10.5)

## 2011-06-11 LAB — TSH: TSH: 0.663 u[IU]/mL (ref 0.350–4.500)

## 2011-06-12 LAB — LIPID PANEL
Cholesterol: 156 mg/dL (ref 0–200)
LDL Cholesterol: 83 mg/dL (ref 0–99)
Triglycerides: 63 mg/dL (ref <150)

## 2011-06-12 LAB — CK TOTAL AND CKMB (NOT AT ARMC): Relative Index: 1 (ref 0.0–2.5)

## 2011-06-12 LAB — COMPREHENSIVE METABOLIC PANEL
ALT: 8 U/L (ref 0–35)
CO2: 24 mEq/L (ref 19–32)
Calcium: 9.6 mg/dL (ref 8.4–10.5)
Chloride: 104 mEq/L (ref 96–112)
Glucose, Bld: 87 mg/dL (ref 70–99)
Sodium: 138 mEq/L (ref 135–145)
Total Protein: 6.7 g/dL (ref 6.0–8.3)

## 2011-06-12 LAB — BRAIN NATRIURETIC PEPTIDE: Brain Natriuretic Peptide: 2.8 pg/mL (ref 0.0–100.0)

## 2011-06-12 LAB — SEDIMENTATION RATE: Sed Rate: 7 mm/hr (ref 0–22)

## 2011-06-24 ENCOUNTER — Other Ambulatory Visit: Payer: Self-pay | Admitting: Internal Medicine

## 2011-07-07 ENCOUNTER — Telehealth: Payer: Self-pay | Admitting: Internal Medicine

## 2011-07-07 DIAGNOSIS — I10 Essential (primary) hypertension: Secondary | ICD-10-CM

## 2011-07-07 MED ORDER — LISINOPRIL-HYDROCHLOROTHIAZIDE 20-12.5 MG PO TABS: 1.0000 | ORAL_TABLET | Freq: Every day | ORAL | Status: DC

## 2011-07-08 NOTE — Telephone Encounter (Signed)
done

## 2011-10-20 ENCOUNTER — Other Ambulatory Visit (HOSPITAL_COMMUNITY): Payer: Self-pay | Admitting: Obstetrics and Gynecology

## 2011-10-20 DIAGNOSIS — Z1231 Encounter for screening mammogram for malignant neoplasm of breast: Secondary | ICD-10-CM

## 2011-11-06 ENCOUNTER — Ambulatory Visit (HOSPITAL_COMMUNITY)
Admission: RE | Admit: 2011-11-06 | Discharge: 2011-11-06 | Disposition: A | Discharge status: Home / Self Care | Payer: BC Managed Care – PPO | Source: Ambulatory Visit | Attending: Obstetrics and Gynecology | Admitting: Obstetrics and Gynecology

## 2011-11-06 DIAGNOSIS — Z1231 Encounter for screening mammogram for malignant neoplasm of breast: Secondary | ICD-10-CM | POA: Insufficient documentation

## 2012-01-19 ENCOUNTER — Emergency Department (HOSPITAL_COMMUNITY)
Admission: EM | Admit: 2012-01-19 | Discharge: 2012-01-19 | Disposition: A | Discharge status: Home / Self Care | Payer: BC Managed Care – PPO | Attending: Emergency Medicine | Admitting: Emergency Medicine

## 2012-01-19 ENCOUNTER — Encounter (HOSPITAL_COMMUNITY): Payer: Self-pay

## 2012-01-19 DIAGNOSIS — M79606 Pain in leg, unspecified: Secondary | ICD-10-CM

## 2012-01-19 DIAGNOSIS — R609 Edema, unspecified: Secondary | ICD-10-CM

## 2012-01-19 DIAGNOSIS — Z833 Family history of diabetes mellitus: Secondary | ICD-10-CM | POA: Insufficient documentation

## 2012-01-19 DIAGNOSIS — Z823 Family history of stroke: Secondary | ICD-10-CM | POA: Insufficient documentation

## 2012-01-19 DIAGNOSIS — M79673 Pain in unspecified foot: Secondary | ICD-10-CM

## 2012-01-19 DIAGNOSIS — Z801 Family history of malignant neoplasm of trachea, bronchus and lung: Secondary | ICD-10-CM | POA: Insufficient documentation

## 2012-01-19 DIAGNOSIS — Z8249 Family history of ischemic heart disease and other diseases of the circulatory system: Secondary | ICD-10-CM | POA: Insufficient documentation

## 2012-01-19 DIAGNOSIS — I10 Essential (primary) hypertension: Secondary | ICD-10-CM | POA: Insufficient documentation

## 2012-01-19 DIAGNOSIS — M79609 Pain in unspecified limb: Secondary | ICD-10-CM | POA: Insufficient documentation

## 2012-01-19 DIAGNOSIS — Z8489 Family history of other specified conditions: Secondary | ICD-10-CM | POA: Insufficient documentation

## 2012-01-19 MED ORDER — TRAMADOL HCL 50 MG PO TABS: 50.0000 mg | ORAL_TABLET | Freq: Four times a day (QID) | ORAL | Status: DC | PRN

## 2012-01-19 MED ORDER — TRAMADOL HCL 50 MG PO TABS
50.0000 mg | ORAL_TABLET | Freq: Once | ORAL | Status: AC
  Administered 2012-01-19: 50 mg via ORAL
  Filled 2012-01-19: qty 1

## 2012-01-19 NOTE — ED Provider Notes (Signed)
History     CSN: [id number]  Arrival date & time 01/19/12  4098   First MD Initiated Contact with Patient 01/19/12 1036      Chief Complaint  Patient presents with  . Leg Pain  . Foot Pain    (Consider location/radiation/quality/duration/timing/severity/associated sxs/prior treatment) HPI Comments: Patient presented with complaint of bilateral leg and foot pain and swelling for 1 week. Patient rates the pain a 6/10, without radiation or transmission, and worse since she has been on her feet more often. She has not tried and medications for the pain. Of note patient has hypertension and does not always take her medication, because it makes her have to urinate more than she would like. Denies SOB, CP, palpitations. Denies numbness or tingling.   The history is provided by the patient. No language interpreter was used.    Past Medical History  Diagnosis Date  . CARPAL TUNNEL SYNDROME, BILATERAL 05/22/2010  . HYPERTENSION 10/25/2008  . WRIST PAIN, BILATERAL 05/22/2010  . Dental caries     Past Surgical History  Procedure Date  . Abdominal hysterectomy 09/2010    fibroid, partial hysterectomy   . Wisdom tooth extraction   . Toenail excision     Family History  Problem Relation Age of Onset  . Hypertension Father   . Gout Father   . Clotting disorder Father   . Stroke Father   . Lung disease Father     listeria  . Cancer Maternal Grandfather     lung cancer (smoking)  . Cancer Mother     breast cancer  . Migraines Sister   . Heart disease Daughter     heart murmur  . Diabetes Neg Hx     History  Substance Use Topics  . Smoking status: Never Smoker   . Smokeless tobacco: Never Used  . Alcohol Use: No    OB History    Grav Para Term Preterm Abortions TAB SAB Ect Mult Living                  Review of Systems  Respiratory: Negative for cough and shortness of breath.   Cardiovascular: Negative for chest pain.  Musculoskeletal: Positive for arthralgias.    Neurological: Negative for numbness.    Allergies  Review of patient's allergies indicates no known allergies.  Home Medications   Current Outpatient Rx  Name Route Sig Dispense Refill  . LISINOPRIL-HYDROCHLOROTHIAZIDE 20-12.5 MG PO TABS Oral Take 1 tablet by mouth daily. For high blood pressure 90 tablet 3    BP 118/69  Pulse 72  Temp 98.4 F (36.9 C) (Oral)  Resp 20  SpO2 98%  Physical Exam  Nursing note and vitals reviewed. Constitutional: She appears well-developed and well-nourished. No distress.  HENT:  Head: Normocephalic and atraumatic.  Mouth/Throat: Oropharynx is clear and moist.  Eyes: Conjunctivae normal and EOM are normal.  Neck: Normal range of motion. Neck supple.  Cardiovascular: Normal rate, regular rhythm and normal heart sounds.   Pulmonary/Chest: Effort normal and breath sounds normal. She has no rales.  Abdominal: Soft. Bowel sounds are normal. There is no tenderness.  Musculoskeletal: Normal range of motion. She exhibits tenderness. She exhibits no edema.       Patient has tenderness to palpation of the knees and feet. 1+ pitting edema bilaterally.  Neurological: She is alert.  Skin: Skin is warm and dry.    ED Course  Procedures (including critical care time)  Labs Reviewed - No data to display No  results found.   1. Dependent edema   2. Leg pain   3. Foot pain       MDM  Patient presented with complaint of lower extremity swelling for one week. Patient admits non-adherent to hypertensive meds. No signs of volume overload or suspicion for CHF. Pain most like due to a combination of depended edema and arthritis. Patient given tramadol in ED with improvement and discharged on same with return precautions. Patient instructed to follow-up with PCP to have kidney function and hypertension evaluated.         Pixie Casino, PA-C 01/19/12 1437

## 2012-01-19 NOTE — ED Notes (Signed)
Patient reports that she is having bilateral leg and foot pain. Patient describes the pain as tingling and tight. Patient reports that she has a history of swelling in both feet and legs. Patient states that she has been standing a lot on her job and the symptoms have worsened.

## 2012-01-19 NOTE — ED Provider Notes (Signed)
Medical screening examination/treatment/procedure(s) were performed by non-physician practitioner and as supervising physician I was immediately available for consultation/collaboration.    Jamori Biggar R Lorayne Getchell, MD 01/19/12 1611 

## 2012-04-07 DIAGNOSIS — M1990 Unspecified osteoarthritis, unspecified site: Secondary | ICD-10-CM

## 2012-04-07 HISTORY — DX: Unspecified osteoarthritis, unspecified site: M19.90

## 2012-06-09 ENCOUNTER — Encounter: Payer: BC Managed Care – PPO | Admitting: Family Medicine

## 2012-06-20 ENCOUNTER — Other Ambulatory Visit: Payer: Self-pay | Admitting: Family Medicine

## 2012-07-22 ENCOUNTER — Other Ambulatory Visit: Payer: Self-pay | Admitting: Family Medicine

## 2012-09-13 ENCOUNTER — Encounter: Payer: BC Managed Care – PPO | Admitting: Family Medicine

## 2012-10-05 ENCOUNTER — Other Ambulatory Visit: Payer: Self-pay | Admitting: Family Medicine

## 2012-10-05 NOTE — Telephone Encounter (Signed)
PATIENT NEEDS A OFFICE VISIT AS SOON AS POSSIBLE 

## 2012-10-11 ENCOUNTER — Encounter: Payer: Self-pay | Admitting: Medical

## 2012-10-11 ENCOUNTER — Ambulatory Visit (INDEPENDENT_AMBULATORY_CARE_PROVIDER_SITE_OTHER): Payer: BC Managed Care – PPO | Admitting: Medical

## 2012-10-11 VITALS — BP 120/82 | HR 80 | Temp 98.1°F | Resp 16 | Wt 218.0 lb

## 2012-10-11 DIAGNOSIS — R251 Tremor, unspecified: Secondary | ICD-10-CM

## 2012-10-11 DIAGNOSIS — I10 Essential (primary) hypertension: Secondary | ICD-10-CM

## 2012-10-11 DIAGNOSIS — M25519 Pain in unspecified shoulder: Secondary | ICD-10-CM

## 2012-10-11 DIAGNOSIS — R259 Unspecified abnormal involuntary movements: Secondary | ICD-10-CM

## 2012-10-11 DIAGNOSIS — M25511 Pain in right shoulder: Secondary | ICD-10-CM

## 2012-10-11 DIAGNOSIS — E010 Iodine-deficiency related diffuse (endemic) goiter: Secondary | ICD-10-CM

## 2012-10-11 DIAGNOSIS — R490 Dysphonia: Secondary | ICD-10-CM

## 2012-10-11 DIAGNOSIS — E049 Nontoxic goiter, unspecified: Secondary | ICD-10-CM

## 2012-10-11 LAB — TSH: TSH: 0.622 u[IU]/mL (ref 0.350–4.500)

## 2012-10-11 LAB — CBC WITH DIFFERENTIAL/PLATELET
Basophils Absolute: 0 10*3/uL (ref 0.0–0.1)
Basophils Relative: 0 % (ref 0–1)
Eosinophils Absolute: 0.1 10*3/uL (ref 0.0–0.7)
Eosinophils Relative: 1 % (ref 0–5)
HCT: 38.9 % (ref 36.0–46.0)
MCHC: 33.9 g/dL (ref 30.0–36.0)
MCV: 83.7 fL (ref 78.0–100.0)
Monocytes Absolute: 0.4 10*3/uL (ref 0.1–1.0)
Neutro Abs: 3.5 10*3/uL (ref 1.7–7.7)
RDW: 14.1 % (ref 11.5–15.5)

## 2012-10-11 LAB — COMPREHENSIVE METABOLIC PANEL
ALT: 8 U/L (ref 0–35)
CO2: 24 mEq/L (ref 19–32)
Sodium: 136 mEq/L (ref 135–145)
Total Bilirubin: 0.5 mg/dL (ref 0.3–1.2)
Total Protein: 7.1 g/dL (ref 6.0–8.3)

## 2012-10-11 MED ORDER — LISINOPRIL-HYDROCHLOROTHIAZIDE 20-12.5 MG PO TABS: 1.0000 | ORAL_TABLET | Freq: Every day | ORAL | Status: DC

## 2012-10-11 NOTE — Progress Notes (Signed)
Subjective:  Kimberly Underwood is a 47 y.o. female who presents for med check.  Last visit over a year ago, and was new patient to establish at that time.  She is compliant with her BP medication, BPs run normal at home.  She has a few concerns.  She notes "tic or tickle" in voice, ongoing for months.  Sometimes voices cracks, sometimes hard to get words out.  Denies problems swallowing or eating or drinking.  denies anxiety, stuttering, embarrassment, but others have commented on her speech and slight tremor/shakes of her head.  She notices the tremor too.  Denies resting tremor in hands, slow movement, slurred speech or changes in handwriting, but does feel like head tremor is worse at rest.  Dad had similar tremor but it resolved on its own.   She notes pains in shoulders, R>L.  Worse with over head motion.  Intermittent.   Hasn't taken anything for it.  No injury, no trauma. No other aggravating or relieving factors.    No other c/o.  The following portions of the patient's history were reviewed and updated as appropriate: allergies, current medications, past family history, past medical history, past social history, past surgical history and problem list.  Past Medical History  Diagnosis Date  . CARPAL TUNNEL SYNDROME, BILATERAL 05/22/2010  . HYPERTENSION 10/25/2008  . WRIST PAIN, BILATERAL 05/22/2010  . Dental caries     ROS Otherwise as in subjective above  Objective: Physical Exam  Vital signs reviewed  General appearance: alert, no distress, WD/WN, AA female HEENT: normocephalic, sclerae anicteric, conjunctiva pink and moist, TMs pearly, nares patent, no discharge or erythema, pharynx normal Oral cavity: MMM, some tooth decay present Neck: supple, mild right lower neck tenderness, no lymphadenopathy, mild generalized thyromegaly without obvious nodules, no masses Heart: RRR, normal S1, S2, no murmurs Lungs: CTA bilaterally, no wheezes, rhonchi, or rales Abdomen: +bs, soft, non  tender, non distended, no masses, no hepatomegaly, no splenomegaly Pulses: 2+ radial pulses, 2+ pedal pulses, normal cap refill Ext: no edema MSK: shoulders nontender to palpation, no swelling or deformity.  Pain with right shoulder abduction and flexion over 90 degrees, slight decreased ROM of right ext/int ROM, rest of UE unremarkable Back: nontender Neuro: mild head tremor noticed at times, otherwise no other tremor, CN2-12 intact, Romberg negative, normal heel to toe, non focal otherwise   Assessment: Encounter Diagnoses  Name Primary?  . Essential hypertension, benign Yes  . Pain in joint, shoulder region, right   . Tremor   . Dysphonia   . Thyromegaly      Plan: HTN - compliant, labs today, nonfasting, c/t same medication, refills sent Pain in shoulder - likely bursitis vs arthritis; for now begin short term Aleve OTC x 1-2 wk.  If not improving, will pursue xray Tremor of head, voice tremor vs dysphonia vs tic - discussed possible differential.  Doubt ENT problem.   Most suggestive of essential tremor.   However, she does have shoulder and neck issues, spasm at times per history. Thus, consider neurology referral.   Discussed case with Dr. Susann Givens, supervising physician. Thyromegaly - noted, no further eval other than labs today  Follow up: pending labs

## 2012-10-12 ENCOUNTER — Other Ambulatory Visit: Payer: Self-pay | Admitting: Medical

## 2012-10-12 MED ORDER — CYCLOBENZAPRINE HCL 10 MG PO TABS: 10.0000 mg | ORAL_TABLET | Freq: Three times a day (TID) | ORAL | Status: DC | PRN

## 2012-10-29 ENCOUNTER — Ambulatory Visit (INDEPENDENT_AMBULATORY_CARE_PROVIDER_SITE_OTHER): Payer: BC Managed Care – PPO | Admitting: Medical

## 2012-10-29 ENCOUNTER — Encounter: Payer: Self-pay | Admitting: Medical

## 2012-10-29 VITALS — BP 118/80 | HR 80 | Temp 97.6°F | Resp 16 | Wt 214.0 lb

## 2012-10-29 DIAGNOSIS — M7521 Bicipital tendinitis, right shoulder: Secondary | ICD-10-CM

## 2012-10-29 DIAGNOSIS — I10 Essential (primary) hypertension: Secondary | ICD-10-CM

## 2012-10-29 DIAGNOSIS — M7520 Bicipital tendinitis, unspecified shoulder: Secondary | ICD-10-CM

## 2012-10-29 DIAGNOSIS — R251 Tremor, unspecified: Secondary | ICD-10-CM

## 2012-10-29 DIAGNOSIS — R259 Unspecified abnormal involuntary movements: Secondary | ICD-10-CM

## 2012-10-29 MED ORDER — HYDROCODONE-ACETAMINOPHEN 5-325 MG PO TABS
1.0000 | ORAL_TABLET | Freq: Four times a day (QID) | ORAL | Status: DC | PRN
Start: 2012-10-29 — End: 2012-12-23

## 2012-10-29 MED ORDER — NAPROXEN SODIUM ER 500 MG PO TB24: 500.0000 mg | ORAL_TABLET | Freq: Every day | ORAL | Status: DC

## 2012-10-29 NOTE — Patient Instructions (Addendum)
Biceps Tendonitis:  This weekend REST!!! Get an OTC arm sling and rest the right arm.  ICE 3 times daily for 20 minutes each all weekend.  Begin Naprelan samples.  This is prescription anti-inflammatory.  Take the 750mg  strength daily with food for 3 days.  Then go to 500mg  daily for 2 days.    Use the Hydrocodone as needed for worse pain over the weekend.  This can be taken every 4-6 hours as needed.  Hydrocodone may cause drowsiness or constipation.  Lets see how this does for your shoulder  We will refer you to neurology for the tremor.

## 2012-10-29 NOTE — Addendum Note (Signed)
Addended by: Janeice Robinson on: 10/29/2012 10:35 AM   Modules accepted: Orders

## 2012-10-29 NOTE — Progress Notes (Signed)
Subjective: I saw her a month ago for routine f/u on blood pressure and other concerns.    Complaint with BP medication, no c/o.  Here for ongoing soreness of right arm.   Been using Aleve, helped some, but pain still there.  Neck seems much better.  Used the Flexeril muscle relaxer at bedtime several days, this seemed to help.  She reports right shoulder pain, worse lateral shoulder, worse with activity on the job.  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  Works at [employer].  Denies numbness, weakness, tingling in the right arm.  She is right handed.   No other aggravating or relieving factors.    She notes "tic or tickle" in voice, ongoing for months.  Sometimes voices cracks, sometimes hard to get words out.  Denies problems swallowing or eating or drinking.  denies anxiety, stuttering, embarrassment, but others have commented on her speech and slight tremor/shakes of her head.  She notices the tremor too.  Denies resting tremor in hands, slow movement, slurred speech or changes in handwriting, but does feel like head tremor is worse at rest.  Dad had similar tremor but it resolved on its own.   No other c/o.  The following portions of the patient's history were reviewed and updated as appropriate: allergies, current medications, past family history, past medical history, past social history, past surgical history and problem list.  Past Medical History  Diagnosis Date  . CARPAL TUNNEL SYNDROME, BILATERAL 05/22/2010  . HYPERTENSION 10/25/2008  . WRIST PAIN, BILATERAL 05/22/2010  . Dental caries     ROS Otherwise as in subjective above  Objective: Physical Exam  Vital signs reviewed  General appearance: alert, no distress, WD/WN, AA female Neck: supple, mild right lower neck tenderness, no lymphadenopathy, mild generalized thyromegaly without obvious nodules, no masses Pulses: 2+ radial pulses, 2+ pedal pulses, normal cap  refill Ext: no edema MSK: tender right biceps tendon origin and lateral and anterior right deltoid, otherwise nontender shoulder, no swelling or deformity.  Pain with right shoulder abduction and flexion over 90 degrees, slight decreased ROM of right ext/int ROM, rest of UE unremarkable Back: nontender Neuro: mild head tremor noticed at times, otherwise no other tremor, CN2-12 intact, Romberg negative, normal heel to toe, non focal otherwise   Assessment: Encounter Diagnoses  Name Primary?  . Biceps tendonitis, right Yes  . Tremor   . Essential hypertension, benign      Plan: Biceps tendonitis - we will be aggressive this weekend with ice, rest, arm sling, stretching exercises as discussed, Naprelan samples, and Hydrocodone prn.  Call back 1-2 wk. Tremor, of head, voice tremor vs dysphonia vs tic - discussed possible differential.  Doubt ENT problem.   Most suggestive of essential tremor.   However, she does have shoulder and neck issues, spasm at times per history.  Referral to neurology HTN - compliant, labs today, nonfasting, c/t same medication, refills sent

## 2012-11-15 ENCOUNTER — Ambulatory Visit (INDEPENDENT_AMBULATORY_CARE_PROVIDER_SITE_OTHER): Payer: BC Managed Care – PPO | Admitting: Neurology

## 2012-11-15 ENCOUNTER — Encounter: Payer: Self-pay | Admitting: Neurology

## 2012-11-15 VITALS — BP 116/76 | HR 92 | Temp 98.3°F | Resp 18 | Wt 216.0 lb

## 2012-11-15 DIAGNOSIS — G243 Spasmodic torticollis: Secondary | ICD-10-CM

## 2012-11-15 NOTE — Patient Instructions (Addendum)
1.  Your diagnosis is cervical dystonia. 2.  We will follow with you every 6 months, unless you decide that you would like to try botox earlier 3.  I have printed some patient literature and you may be able to find more on the [employer].

## 2012-11-15 NOTE — Progress Notes (Signed)
Kimberly Underwood was seen today in neurologic consultation at the request of Carollee Herter, MD.  The consultation is for the evaluation of head tremor.  The pt reports that it has been going on for 2 years.  She notices it just a little but her friends have noted it.  It has not gotten worse.  It does not interfere with ADL's.  Her father had head intubation in his 55's.  The pt denies hand tremor.  She states that her balance is good.  Her neck is not painful.  She has no swallowing trouble.  Neuroimaging has not previously been performed.    PREVIOUS MEDICATIONS: none  ALLERGIES:  No Known Allergies  CURRENT MEDICATIONS:  Current Outpatient Prescriptions on File Prior to Visit  Medication Sig Dispense Refill  . cyclobenzaprine (FLEXERIL) 10 MG tablet Take 1 tablet (10 mg total) by mouth 3 (three) times daily as needed for muscle spasms.  20 tablet  0  . HYDROcodone-acetaminophen (NORCO/VICODIN) 5-325 MG per tablet Take 1 tablet by mouth every 6 (six) hours as needed for pain.  20 tablet  0  . lisinopril-hydrochlorothiazide (PRINZIDE,ZESTORETIC) 20-12.5 MG per tablet Take 1 tablet by mouth daily.  30 tablet  11  . naproxen (NAPRELAN) 500 MG 24 hr tablet Take 1 tablet (500 mg total) by mouth daily with breakfast.  4 tablet  0   No current facility-administered medications on file prior to visit.    PAST MEDICAL HISTORY:   Past Medical History  Diagnosis Date  . CARPAL TUNNEL SYNDROME, BILATERAL 05/22/2010  . HYPERTENSION 10/25/2008  . WRIST PAIN, BILATERAL 05/22/2010  . Dental caries     PAST SURGICAL HISTORY:   Past Surgical History  Procedure Laterality Date  . Abdominal hysterectomy  09/2010    fibroid, partial hysterectomy   . Wisdom tooth extraction    . Toenail excision      SOCIAL HISTORY:   History   Social History  . Marital Status: Single    Spouse Name: N/A    Number of Children: N/A  . Years of Education: N/A   Occupational History  . Quality Control   .  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 Smoking status: Never Smoker   . Smokeless tobacco: Never Used  . Alcohol Use: No  . Drug Use: No  . Sexually Active: Not on file   Other Topics Concern  . Not on file   Social History Narrative   Single, 2 children, ages 22yo son, daughter age 39yo, exercise - none    FAMILY HISTORY:   Family Status  Relation Status Death Age  . Father Deceased     arrythmia  . Maternal Grandmother      CVA  . Mother Alive   . Sister Alive   . Brother Alive     ROS:  A complete 10 system review of systems was obtained and was unremarkable apart from what is mentioned above.  PHYSICAL EXAMINATION:    VITALS:   Filed Vitals:   11/15/12 0925  BP: 116/76  Pulse: 92  Temp: 98.3 F (36.8 C)  Resp: 18  Weight: 216 lb (97.977 kg)    GEN:  Normal appears female in no acute distress.  Appears stated age. HEENT:  Normocephalic, atraumatic. The mucous membranes are moist. The superficial temporal arteries are without ropiness or tenderness. Cardiovascular: Regular rate and rhythm. Lungs: Clear to auscultation bilaterally. Neck/Heme: There are no carotid bruits noted bilaterally.  MS:  There is a head titubation in the "no" direction that is most pronounced when the patient is distracted and involves the left sternocleidomastoid and right splenius capitis.  There is no limitation with range of motion.  When the head is turned to the left, tremor abates.  NEUROLOGICAL: Orientation:  The patient is alert and oriented x 3.  Fund of knowledge is appropriate.  Recent and remote memory intact.  Attention span and concentration normal.  Repeats and names without difficulty. Cranial nerves: There is good facial symmetry. The pupils are equal round and reactive to light bilaterally. Fundoscopic exam reveals clear disc margins bilaterally. Extraocular muscles are intact and visual fields are full to confrontational testing. Speech is fluent and  clear. Soft palate rises symmetrically and there is no tongue deviation. Hearing is intact to conversational tone. Tone: Tone is good throughout. Sensation: Sensation is intact to light touch and pinprick throughout (facial, trunk, extremities). Vibration is intact at the bilateral big toe. There is no extinction with double simultaneous stimulation. There is no sensory dermatomal level identified. Coordination:  The patient has no difficulty with RAM's or FNF bilaterally. Motor: Strength is 5/5 in the bilateral upper and lower extremities.  Shoulder shrug is equal and symmetric. There is no pronator drift.  There are no fasciculations noted. DTR's: Deep tendon reflexes are 2+/4 at the bilateral biceps, triceps, brachioradialis, patella and achilles.  Plantar responses are downgoing bilaterally. Gait and Station: The patient is able to ambulate without difficulty. The patient is able to heel toe walk without any difficulty. The patient is able to ambulate in a tandem fashion. The patient is able to stand in the Romberg position.  LABS:  Lab Results  Component Value Date   TSH 0.622 10/11/2012   Lab Results  Component Value Date   WBC 5.9 10/11/2012   HGB 13.2 10/11/2012   HCT 38.9 10/11/2012   MCV 83.7 10/11/2012   PLT 364 10/11/2012     Chemistry      Component Value Date/Time   NA 136 10/11/2012 1224   K 4.1 10/11/2012 1224   CL 104 10/11/2012 1224   CO2 24 10/11/2012 1224   BUN 12 10/11/2012 1224   CREATININE 0.87 10/11/2012 1224   CREATININE 0.67 09/04/2009 1000      Component Value Date/Time   CALCIUM 9.4 10/11/2012 1224   ALKPHOS 59 10/11/2012 1224   AST 13 10/11/2012 1224   ALT 8 10/11/2012 1224   BILITOT 0.5 10/11/2012 1224        IMPRESSION/PLAN:  1.  cervical dystonia.  -The patient and I talked about pathophysiology.  We talked about treatment options.  We talked about Botox along with its risks and benefits and the blackbox warning.  In the end, we decided to just reevaluate her in 6 months.   If she decides she would like to consider Botox more seriously before then, I would be happy to see her back earlier.  She was given patient education.  -Time in the room with the patient today was 60 minutes

## 2012-11-15 NOTE — Progress Notes (Signed)
No problem.  She was really very pleasant and we had a good visit!

## 2012-12-23 ENCOUNTER — Observation Stay (HOSPITAL_COMMUNITY)
Admission: EM | Admit: 2012-12-23 | Discharge: 2012-12-24 | Disposition: A | Discharge status: Home / Self Care | Payer: BC Managed Care – PPO | Attending: Internal Medicine | Admitting: Internal Medicine

## 2012-12-23 ENCOUNTER — Other Ambulatory Visit: Payer: Self-pay

## 2012-12-23 ENCOUNTER — Emergency Department (HOSPITAL_COMMUNITY): Payer: BC Managed Care – PPO

## 2012-12-23 ENCOUNTER — Encounter (HOSPITAL_COMMUNITY): Payer: Self-pay | Admitting: *Deleted

## 2012-12-23 DIAGNOSIS — I10 Essential (primary) hypertension: Secondary | ICD-10-CM | POA: Insufficient documentation

## 2012-12-23 DIAGNOSIS — I517 Cardiomegaly: Secondary | ICD-10-CM | POA: Insufficient documentation

## 2012-12-23 DIAGNOSIS — I209 Angina pectoris, unspecified: Secondary | ICD-10-CM | POA: Insufficient documentation

## 2012-12-23 DIAGNOSIS — I208 Other forms of angina pectoris: Secondary | ICD-10-CM

## 2012-12-23 DIAGNOSIS — Z23 Encounter for immunization: Secondary | ICD-10-CM | POA: Insufficient documentation

## 2012-12-23 DIAGNOSIS — R071 Chest pain on breathing: Principal | ICD-10-CM | POA: Insufficient documentation

## 2012-12-23 HISTORY — DX: Gout, unspecified: M10.9

## 2012-12-23 HISTORY — DX: Unspecified osteoarthritis, unspecified site: M19.90

## 2012-12-23 LAB — CBC
MCH: 29 pg (ref 26.0–34.0)
MCHC: 33.6 g/dL (ref 30.0–36.0)
Platelets: 321 10*3/uL (ref 150–400)
RDW: 13 % (ref 11.5–15.5)

## 2012-12-23 LAB — BASIC METABOLIC PANEL
Calcium: 9.5 mg/dL (ref 8.4–10.5)
GFR calc non Af Amer: >90 mL/min (ref >90)
Sodium: 134 mEq/L — ABNORMAL LOW (ref 135–145)

## 2012-12-23 LAB — POCT I-STAT TROPONIN I: Troponin i, poc: 0 ng/mL (ref 0.00–0.08)

## 2012-12-23 MED ORDER — ASPIRIN 81 MG PO CHEW
324.0000 mg | CHEWABLE_TABLET | Freq: Once | ORAL | Status: AC
  Administered 2012-12-23: 324 mg via ORAL
  Filled 2012-12-23: qty 4

## 2012-12-23 MED ORDER — HEPARIN SODIUM (PORCINE) 5000 UNIT/ML IJ SOLN
5000.0000 [IU] | Freq: Three times a day (TID) | INTRAMUSCULAR | Status: DC
  Administered 2012-12-23 – 2012-12-24 (×2): 5000 [IU] via SUBCUTANEOUS
  Filled 2012-12-23 (×5): qty 1

## 2012-12-23 MED ORDER — NITROGLYCERIN 0.4 MG SL SUBL
0.4000 mg | SUBLINGUAL_TABLET | SUBLINGUAL | Status: DC | PRN
  Administered 2012-12-23: 0.4 mg via SUBLINGUAL

## 2012-12-23 MED ORDER — HYDROCHLOROTHIAZIDE 12.5 MG PO CAPS
12.5000 mg | ORAL_CAPSULE | Freq: Every day | ORAL | Status: DC
Start: 2012-12-24 — End: 2012-12-24
  Administered 2012-12-24: 12.5 mg via ORAL
  Filled 2012-12-23: qty 1

## 2012-12-23 MED ORDER — ASPIRIN EC 81 MG PO TBEC
81.0000 mg | DELAYED_RELEASE_TABLET | Freq: Every day | ORAL | Status: DC
  Administered 2012-12-24: 81 mg via ORAL
  Filled 2012-12-23 (×2): qty 1

## 2012-12-23 MED ORDER — INFLUENZA VAC SPLIT QUAD 0.5 ML IM SUSP
0.5000 mL | INTRAMUSCULAR | Status: AC
  Administered 2012-12-24: 0.5 mL via INTRAMUSCULAR
  Filled 2012-12-23: qty 0.5

## 2012-12-23 MED ORDER — LISINOPRIL 20 MG PO TABS
20.0000 mg | ORAL_TABLET | Freq: Every day | ORAL | Status: DC
  Administered 2012-12-24: 20 mg via ORAL
  Filled 2012-12-23: qty 1

## 2012-12-23 MED ORDER — SODIUM CHLORIDE 0.9 % IJ SOLN
3.0000 mL | Freq: Two times a day (BID) | INTRAMUSCULAR | Status: DC
  Administered 2012-12-23: 3 mL via INTRAVENOUS

## 2012-12-23 MED ORDER — MORPHINE SULFATE 2 MG/ML IJ SOLN: 1.0000 mg | INTRAMUSCULAR | Status: DC | PRN

## 2012-12-23 MED ORDER — LISINOPRIL-HYDROCHLOROTHIAZIDE 20-12.5 MG PO TABS: 1.0000 | ORAL_TABLET | Freq: Every day | ORAL | Status: DC

## 2012-12-23 NOTE — ED Provider Notes (Signed)
CSN: [id number]     Arrival date & time 12/23/12  4098 History   First MD Initiated Contact with Patient 12/23/12 0940     Chief Complaint  Patient presents with  . Chest Pain   HPI Comments: Pt is a 47 y/o female w/ PMHx of HTN on lisinopril who presents today for chest tightness that began around 8 AM this morning.  Pt states she was in her normal state of health up until this AM.  Pt was asleep, awoke realizing she had overslept, and jumped out of bed at which point she had some chest tightness that was midsternal.  At that point, she sat down, and the tightness was slightly relieved.  Pt progressed to go to work noticing that pain was worse with sitting forward or bending over but was nonexertional and not relieved by rest.  She does c/o some slight tingling down her R arm but no paresthesias into her jaw/L arm or any nausea or vomiting.  Denies fever, chills, abdominal pain, recent sick contacts as well.  She does not smoke and has never smoked, denies alcohol use, or illicit drug use.  No family history of early cardiac disease.    Past Medical History  Diagnosis Date  . CARPAL TUNNEL SYNDROME, BILATERAL 05/22/2010  . HYPERTENSION 10/25/2008  . Dental caries    Past Surgical History  Procedure Laterality Date  . Abdominal hysterectomy  09/2010    fibroid, partial hysterectomy   . Wisdom tooth extraction    . Toenail excision     Family History  Problem Relation Age of Onset  . Hypertension Father   . Gout Father   . Clotting disorder Father   . Stroke Father   . Lung disease Father     listeria  . Cancer Maternal Grandfather     lung cancer (smoking)  . Cancer Mother     breast cancer  . Migraines Sister   . Heart disease Daughter     heart murmur  . Diabetes Neg Hx    History  Substance Use Topics  . Smoking status: Never Smoker   . Smokeless tobacco: Never Used  . Alcohol Use: No   OB History   Grav Para Term Preterm Abortions TAB SAB Ect Mult Living                  Review of Systems  Constitutional: Negative for fever, chills and appetite change.  HENT: Negative for sore throat and sinus pressure.   Eyes: Negative for photophobia.  Respiratory: Positive for chest tightness. Negative for cough, shortness of breath and wheezing.   Cardiovascular: Positive for chest pain. Negative for palpitations and leg swelling.  Gastrointestinal: Negative for nausea, vomiting, abdominal pain, diarrhea, constipation and blood in stool.  Endocrine: Negative.   Genitourinary: Negative for dysuria.  Musculoskeletal: Negative.   Skin: Negative.   Allergic/Immunologic: Negative.   Neurological: Negative for dizziness, syncope, weakness, light-headedness and numbness.  Psychiatric/Behavioral: Negative.     Allergies  Review of patient's allergies indicates no known allergies.  Home Medications   Current Outpatient Rx  Name  Route  Sig  Dispense  Refill  . lisinopril-hydrochlorothiazide (PRINZIDE,ZESTORETIC) 20-12.5 MG per tablet   Oral   Take 1 tablet by mouth daily.   30 tablet   11    BP 118/76  Pulse 71  Temp(Src) 98 F (36.7 C) (Oral)  Resp 18  Ht 5\' 7"  (1.702 m)  Wt 220 lb (99.791 kg)  BMI 34.45  kg/m2  SpO2 97% Physical Exam  Constitutional: She is oriented to person, place, and time. She appears well-developed and well-nourished. No distress.  HENT:  Head: Normocephalic and atraumatic.  Eyes: Conjunctivae and EOM are normal.  Neck: Normal range of motion. Neck supple.  Cardiovascular: Normal rate, regular rhythm and intact distal pulses.   No murmur heard. Pulmonary/Chest: Effort normal and breath sounds normal. No respiratory distress.  Abdominal: Soft. Bowel sounds are normal.  Musculoskeletal: Normal range of motion. She exhibits no edema.  Neurological: She is alert and oriented to person, place, and time.  Skin: Skin is warm and dry. She is not diaphoretic.  Psychiatric: She has a normal mood and affect. Her behavior is normal.     ED Course  Procedures (including critical care time)  Date: 12/23/2012  Rate: 70  Rhythm: normal sinus rhythm  QRS Axis: normal  Intervals: normal  ST/T Wave abnormalities: normal  Conduction Disutrbances: none  Narrative Interpretation: normal EKG  Old EKG Reviewed: None previous to compare  Labs Review Labs Reviewed  BASIC METABOLIC PANEL - Abnormal; Notable for the following:    Sodium 134 (*)    Potassium 3.4 (*)    All other components within normal limits  CBC  POCT I-STAT TROPONIN I   Imaging Review Dg Chest 2 View  12/23/2012   CLINICAL DATA:  Chest pain  EXAM: CHEST  2 VIEW  COMPARISON:  None.  FINDINGS: Lungs are clear. Heart is borderline enlarged with normal pulmonary vascularity. No adenopathy. No pneumothorax. No bone lesions. .  IMPRESSION: Heart borderline enlarged. No edema or consolidation.   Electronically Signed   By: Bretta Bang   On: 12/23/2012 10:16    MDM   1. Atypical angina   Pt c/o constant chest tightness, substernal, w/o exacerbation by activity or improvement with rest.  Her ASCVD is 1.3%, her TIMI is 1, Heart Score of 3, PERC of 0.  Will get POC troponin, BMET, CXR, and CBCAD, EKG.  10:20 AM - EKG showing NSR, no STE and no TWI.  POC troponin negative at 0.00.  CXR showing no acute cardiopulmonary processes.   11:14 AM - Since pt has been having active chest pain, she will need admitted for further w/u.  As well, pt has documented some pleuritic chest pain while here.  Her PERC is 0, however, her pre-test probably is >15%, will get D-Dimer.  12:47 PM - D Dimer slightly elevated to 0.57.  However, pt is at low risk, PERC score of 0, will not get CTA at this time.  Will consult to unassigned for admission.   12:55 PM - Discussed case with internal medicine resident. Dr. Dorise Hiss, who will come to evaluate and admit the patient.   Twana First Paulina Fusi, DO of [facility] 12/23/2012, 12:57 PM      Briscoe Deutscher, DO 12/23/12  1257

## 2012-12-23 NOTE — ED Notes (Signed)
Pt to ED with sub-sternal tightness and some R arm tingling.  Denies cardiac hx or hx of gerd.  2 hour car ride 1 week ago.  C/o increasing pain on inspiration.

## 2012-12-23 NOTE — H&P (Signed)
Medical Student Hospital Admission Note Date: 12/23/2012  Patient name: Kimberly Underwood Medical record number: [id number] Date of birth: 1965/06/08 Age: 47 y.o. Gender: female PCP: Carollee Herter, MD  Medical Service: Internal Medicine  Attending physician: Dr. Jonah Blue, DO   Chief Complaint: Chest pain  History of Present Illness:    Kimberly Underwood is a 47 year old female with a past medical history of hypertension who woke up this morning, realized that she overslept and then began experiencing 5/10 mid sternal chest pain and mild tingling in her right arm that is worse on inspiration, but denies shortness of breath or diaphoresis. She noticed that the pain was accentuated when bending over to pick something up off of the floor. Upon arriving in the ED, she received aspirin 325 mg and nitroglycerin 0.4 mg and her chest pain improved to 4/10.  She has had prior episodes of chest pain that would resolve after a few minutes, were not associated with exercise and did not improve with rest. She has never smoked and drinks alcohol occasionally. She has known hypertension and says she takes her lisinopril-hydrochlorothiazide every day. She does not check her blood pressure at home.   She says the chest pain is worse on inspiration. She denies leg swelling or pain. A few days ago, she took a long car ride for a business trip, three hours each way in one day. Initial troponin values were negative. EKG showed normal sinus rhythm and was significant for possible atrial enlargement.  Chest x-ray showed normal pulmonary vascularity, clear lungs without edema or consolidation, and borderline enlargement of the heart. A d-dimer was slightly elevated 0.57 ug/mL.   Meds: Current Outpatient Rx  Name  Route  Sig  Dispense  Refill  . lisinopril-hydrochlorothiazide (PRINZIDE,ZESTORETIC) 20-12.5 MG per tablet   Oral   Take 1 tablet by mouth daily.   30 tablet   11    Allergies: Allergies as of  12/23/2012  . (No Known Allergies)   Past Medical History  Diagnosis Date  . CARPAL TUNNEL SYNDROME, BILATERAL 05/22/2010  . HYPERTENSION 10/25/2008  . Dental caries    Past Surgical History  Procedure Laterality Date  . Abdominal hysterectomy  09/2010    fibroid, partial hysterectomy   . Wisdom tooth extraction    . Toenail excision     Family History  Problem Relation Age of Onset  . Hypertension Father   . Gout Father   . Clotting disorder Father   . Stroke Father   . Lung disease Father     listeria  . Cancer Maternal Grandfather     lung cancer (smoking)  . Cancer Mother     breast cancer  . Migraines Sister   . Heart disease Daughter     heart murmur  . Diabetes Neg Hx    Social History Main Topics  . Smoking status: Never Smoker   . Smokeless tobacco: Never Used  . Alcohol Use: Occasional  . Drug Use: No  . Sexual Activity: Not on file   Social History Narrative   She is single and has young adult children.  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   Review of Systems: Constitutional: Negative fevers, night sweats, weight loss. Endorses chills. Cardiovascular:  Positive for chest pain. Respiratory: Negative for shortness of breath, hemoptysis, cough. Abdomen:  Negative for abdominal pain. GU:  Negative for dysuria, polyuria, polydipsia, or suprapubic pain. Extremities: Negative for lower  extremity swelling.  Physical Exam: Blood pressure 120/92, pulse 71, temperature 98 F (36.7 C), temperature source Oral, resp. rate 19, height 5\' 7"  (1.702 m), weight 99.791 kg (220 lb), SpO2 100.00%. Body mass index is 34.45 kg/(m^2). General Appearance:    Alert, cooperative, no distress, appears stated age  Head:    Normocephalic, without obvious abnormality, atraumatic  Eyes:    Conjunctiva/corneas clear  Neck:   No carotid bruit or JVD  Back:     Symmetric, no curvature, ROM normal, no CVA  tenderness  Lungs:     Clear to auscultation bilaterally, respirations unlabored  Chest Wall:    No tenderness or deformity   Heart:    Regular rate and rhythm, S1 and S2 normal, no murmur, rub or gallop  Abdomen:     Soft, non-tender, bowel sounds active all four quadrants,    no masses, no organomegaly  Extremities:   Extremities normal, atraumatic, no cyanosis or edema  Pulses:   2+ distal pulses and symmetric all extremities  Skin:   Skin color, texture, turgor normal, no rashes or lesions  Lymph nodes:   Cervical, supraclavicular, and axillary nodes normal    Lab results: Labs Performed in Emergency Department Lab Value  D-Dimer, Quant 0.57 [ref: 0.00 - 0.48 ug/mL]  Troponin i, poc 0.00 [ref 0.00 - 0.08 ng/mL)    Basic Metabolic Panel   Component Value Date/Time   NA 134* 12/23/2012 0941   K 3.4* 12/23/2012 0941   CL 100 12/23/2012 0941   CO2 24 12/23/2012 0941   GLUCOSE 92 12/23/2012 0941   BUN 10 12/23/2012 0941   CREATININE 0.68 12/23/2012 0941   CALCIUM 9.5 12/23/2012 0941   GFRNONAA >90 12/23/2012 0941   GFRAA >90 12/23/2012 0941    CBC  Component Value Date/Time   WBC 4.6 12/23/2012 0941   RBC 4.42 12/23/2012 0941   HGB 12.8 12/23/2012 0941   HCT 38.1 12/23/2012 0941   PLT 321 12/23/2012 0941   MCV 86.2 12/23/2012 0941   MCH 29.0 12/23/2012 0941   MCHC 33.6 12/23/2012 0941   RDW 13.0 12/23/2012 0941   Imaging results:  Dg Chest 2 View  12/23/2012   CLINICAL DATA:  Chest pain  EXAM: CHEST  2 VIEW  COMPARISON:  None.  FINDINGS: Lungs are clear. Heart is borderline enlarged with normal pulmonary vascularity. No adenopathy. No pneumothorax. No bone lesions. .  IMPRESSION: Heart borderline enlarged. No edema or consolidation.   Electronically Signed   By: Bretta Bang   On: 12/23/2012 10:16    Other results: EKG: normal sinus rhythm with possible left atrial enlargement  Assessment & Plan by Problem: #Chest pain: The patient has low risk of coronary artery disease with a  TIMI score of 0. This is unlikely to be pulmonary embolism because although her chest pain is pleuritic, she denied unilateral leg swelling, tachycardia, hemoptysis, or history of cancer, recent prolonged immobilization. D-dimer was slightly elevated.  - Monitor overnight on telemetry - Trend troponins - Check lipid panel, Hb A1C for risk stratification  #Hypertension: Blood pressure stable in ED at 120/79. - Continue lisinopril  #DVT Prophylaxis:  Subcutaneous heparin TID  This is a Psychologist, occupational Note.  The care of the patient was discussed with Dr. Dorise Hiss and the assessment and plan was formulated with their assistance.  Please see their note for official documentation of the patient encounter.   Signed: Bruna Potter 12/23/2012, 2:39 PM

## 2012-12-23 NOTE — ED Provider Notes (Signed)
I saw and evaluated the patient, reviewed the resident's note and I agree with the findings and plan.  This is a 47 year old female with a history of hypertension, hyperlipidemia who presents with chest pain. Patient reports onset of chest pain this morning when she was late for work. She reports sternal pressure that is nonradiating. Patient reported to nursing staff that she had pleuritic pain as well.  She denies this to me. Patient denies any cough or fevers. Patient reports her pain is currently 5/10. She does not take aspirin.  Patient is nontoxic-appearing on exam. Vital signs are within normal limits. Physical exam is benign. Patient will be given aspirin and nitroglycerin for her pain. ACS workup initiated. D-dimer also obtained given patient is low-risk but is having pleuritic pain.  She does have risk factors for ACS. Given that she's had ongoing pain, She will need to be admitted for rule out,  Shon Baton, MD 12/23/12 [phone]

## 2012-12-23 NOTE — ED Provider Notes (Signed)
I saw and evaluated the patient, reviewed the resident's note and I agree with the findings and plan.  Shon Baton, MD 12/23/12 [phone]

## 2012-12-24 DIAGNOSIS — I209 Angina pectoris, unspecified: Secondary | ICD-10-CM

## 2012-12-24 LAB — LIPID PANEL
Cholesterol: 140 mg/dL (ref 0–200)
HDL: 58 mg/dL (ref >39)
Total CHOL/HDL Ratio: 2.4 RATIO
Triglycerides: 93 mg/dL (ref <150)

## 2012-12-24 LAB — HEMOGLOBIN A1C
Hgb A1c MFr Bld: 5.8 % — ABNORMAL HIGH (ref <5.7)
Mean Plasma Glucose: 120 mg/dL — ABNORMAL HIGH (ref <117)

## 2012-12-24 LAB — BASIC METABOLIC PANEL
BUN: 9 mg/dL (ref 6–23)
Chloride: 107 mEq/L (ref 96–112)
GFR calc Af Amer: >90 mL/min (ref >90)
GFR calc non Af Amer: >90 mL/min (ref >90)
Potassium: 3.7 mEq/L (ref 3.5–5.1)
Sodium: 141 mEq/L (ref 135–145)

## 2012-12-24 LAB — TROPONIN I: Troponin I: <0.3 ng/mL (ref <0.30)

## 2012-12-24 NOTE — Progress Notes (Deleted)
Medical Student Daily Progress Note  Subjective: Kimberly Underwood is in good spirits today. Her chest pain has completely resolved and she had not had any shortness of breath, diaphoresis, or nausea. She feels that this episode may have been related to emotional stress secondary to her stressful job. 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  Objective: Vital signs in last 24 hours: Filed Vitals:   12/23/12 1500 12/23/12 1519 12/23/12 2133 12/24/12 0434  BP: 120/79 120/79 111/67 120/63  Pulse: 67 67 67 72  Temp:   98.1 F (36.7 C) 98 F (36.7 C)  TempSrc:   Oral Oral  Resp: 17 20 18 18   Height:      Weight:      SpO2: 98% 99% 100% 99%   Weight change:   Intake/Output Summary (Last 24 hours) at 12/24/12 0943 Last data filed at 12/24/12 0900  Gross per 24 hour  Intake    240 ml  Output      0 ml  Net    240 ml   Physical Exam: General: Well-appearing female in no acute distress Cardiovascular: Regular rate and rhythm, no murmurs, rubs, or gallops Pulmonary: Lungs clear to auscultation bilaterally Abdomen: Normal bowel sounds, nontender to palpation Extremities: No lower extremity edema, no tenderness, warmth, or erythema  Lab Results: Basic Metabolic Panel:  Recent Labs Lab 12/23/12 0941 12/24/12 0505  NA 134* 141  K 3.4* 3.7  CL 100 107  CO2 24 27  GLUCOSE 92 96  BUN 10 9  CREATININE 0.68 0.71  CALCIUM 9.5 8.8   CBC:  Recent Labs Lab 12/23/12 0941  WBC 4.6  HGB 12.8  HCT 38.1  MCV 86.2  PLT 321   Cardiac Enzymes:  Recent Labs Lab 12/23/12 1659 12/23/12 2300 12/24/12 0505  TROPONINI <0.30 <0.30 <0.30   D-Dimer:  Recent Labs Lab 12/23/12 1211  DDIMER 0.57*    Hemoglobin A1C:  Recent Labs Lab 12/23/12 1657  HGBA1C 5.8*   Fasting Lipid Panel: Lipid Panel     Component Value Date/Time    CHOL 140 12/24/2012 0505   TRIG 93 12/24/2012 0505   HDL 58 12/24/2012 0505   CHOLHDL 2.4 12/24/2012 0505   VLDL 19 12/24/2012 0505   LDLCALC 63 12/24/2012 0505    Studies/Results: Dg Chest 2 View  12/23/2012   CLINICAL DATA:  Chest pain  EXAM: CHEST  2 VIEW  COMPARISON:  None.  FINDINGS: Lungs are clear. Heart is borderline enlarged with normal pulmonary vascularity. No adenopathy. No pneumothorax. No bone lesions. .  IMPRESSION: Heart borderline enlarged. No edema or consolidation.   Electronically Signed   By: Bretta Bang   On: 12/23/2012 10:16   Medications: Scheduled Meds: . aspirin EC  81 mg Oral Daily  . heparin  5,000 Units Subcutaneous Q8H  . lisinopril  20 mg Oral Daily   And  . hydrochlorothiazide  12.5 mg Oral Daily  . sodium chloride  3 mL Intravenous Q12H   Continuous Infusions:  PRN Meds:.morphine injection, nitroGLYCERIN  Assessment/Plan:  #Chest pain: The patient has low risk of coronary artery disease (TIMI score was 0), and unlikely to be PE (Geneva score 0). She has never noticed chest pain on exertion while performing her physically strenuous occupation, but she feels that yesterdays episode was related to the emotional stress that she experiences. We feel  that she can continue to follow up as an outpatient with her primary care physician.  - Overnight telemetry was unremarkable - Troponins were negative x 3 - Lipid panel was within normal limits - Hb A1C 5.8  #Hypertension: Blood pressure stable in ED at 120/79.  - Continue lisinopril-hydrochlorothiazide  #DVT Prophylaxis: Subcutaneous heparin TID  Disposition: Discharge home today and schedule follow-up with PCP in one week.   LOS: 1 day   This is a Psychologist, occupational Note.  The care of the patient was discussed with Dr. Delane Ginger and the assessment and plan formulated with their assistance.  Please see their attached note for official documentation of the daily encounter.  Daun Peacock  [place] 12/24/2012, 9:43 AM

## 2012-12-24 NOTE — Discharge Summary (Signed)
Name: Kimberly Underwood MRN: [id number] DOB: Sep 20, 1965 47 y.o. PCP: Ronnald Nian, MD  Date of Admission: 12/23/2012  9:40 AM Date of Discharge: 12/24/2012 Attending Physician: Jonah Blue, DO  Discharge Diagnosis: Principal Problem:   Chest pain on breathing Active Problems:   HYPERTENSION  Discharge Medications:   Medication List         lisinopril-hydrochlorothiazide 20-12.5 MG per tablet  Commonly known as:  PRINZIDE,ZESTORETIC  Take 1 tablet by mouth daily.        Disposition and follow-up:   KimberlyKimberly Underwood was discharged from [facility] in Good condition.  At the hospital follow up visit please address:  1.  Further episodes of chest pain  2.  Labs / imaging needed at time of follow-up: None  3.  Pending labs/ test needing follow-up: None  Follow-up Appointments:     Follow-up Information   Follow up with Ernst Breach, PA-C On 12/28/2012. (4:00pm)    Specialty:  Family Medicine   Contact information:   [address] [place] [address] [phone]       Discharge Instructions: Discharge Orders   Future Appointments Provider Department Dept Phone   12/28/2012 4:15 PM Genia Del [facility] FAMILY MEDICINE [phone]   Future Orders Complete By Expires   Call MD for:  persistant dizziness or light-headedness  As directed    Diet - low sodium heart healthy  As directed    Increase activity slowly  As directed       Consultations:  None  Procedures Performed:  Dg Chest 2 View  12/23/2012   CLINICAL DATA:  Chest pain  EXAM: CHEST  2 VIEW  COMPARISON:  None.  FINDINGS: Lungs are clear. Heart is borderline enlarged with normal pulmonary vascularity. No adenopathy. No pneumothorax. No bone lesions. .  IMPRESSION: Heart borderline enlarged. No edema or consolidation.   Electronically Signed   By: Bretta Bang   On: 12/23/2012 10:16    2D Echo: None  Cardiac Cath: None  Admission HPI: [name] is a 47 year old female with a past medical history of hypertension who woke up this morning, realized that she overslept and then began experiencing 5/10 mid sternal chest pain and mild tingling in her right arm that is worse on inspiration, but denies shortness of breath or diaphoresis. She noticed that the pain was accentuated when bending over to pick something up off of the floor. Upon arriving in the ED, she received aspirin 325 mg and nitroglycerin 0.4 mg and her chest pain improved to 4/10.  She has had prior episodes of chest pain that would resolve after a few minutes, were not associated with exercise and did not improve with rest. She has never smoked and drinks alcohol occasionally. She has known hypertension and says she takes her lisinopril-hydrochlorothiazide every day. She does not check her blood pressure at home.  She says the chest pain is worse on inspiration. She denies leg swelling or pain. A few days ago, she took a long car ride for a business trip, three hours each way in one day. Initial troponin values were negative. EKG showed normal sinus rhythm and was significant for possible atrial enlargement. Chest x-ray showed normal pulmonary vascularity, clear lungs without edema or consolidation, and borderline enlargement of the heart. A d-dimer was slightly elevated 0.57 ug/mL.    Hospital Course by problem list: Principal Problem:   Atypical Chest Pain-[name] with a  PMH of HTN who presented to the ED with substernal chest pain after she woke up in the morning realizing she was late for work. She states the pain radiated to her right arm and was associated with mild tingling of her arm. She states the pain is worse on inspiration but denied any diaphoresis, shortness of breath, or syncopal episodes. She denies smoking or illicit drug use. She was given NTG in the ED which improved her pain. PA/lateral CXR revealed heart was borderline enlarged without edema or  Cconsolidation. Troponins X 3 were negative. D-dimer was mildly elevated at 0.57. Lipid panel and HA1C were drawn and values are listed below. Pt TIMI score was 0 and Geneva score was 0. Differentials considered were: ACS, PE, Pneumothorax, Anxiety. Given her negative tests and associated TIMI and Geneva scores the etiology of the chest pain is likely anxiety. The pt was discharge the next morning with no further episodes of chest pain. She was instructed to follow up with her PCP.  Active Problems:   HYPERTENSION-Stable. Pt is on lisinopril/HCTZ combo which was continued during her hospitalization.     Discharge Vitals:   BP 120/63  Pulse 72  Temp(Src) 98 F (36.7 C) (Oral)  Resp 18  Ht 5\' 7"  (1.702 m)  Wt 99.791 kg (220 lb)  BMI 34.45 kg/m2  SpO2 99%  Discharge Labs:  Results for orders placed during the hospital encounter of 12/23/12 (from the past 24 hour(s))  D-DIMER, QUANTITATIVE     Status: Abnormal   Collection Time    12/23/12 12:11 PM      Result Value Range   D-Dimer, Quant 0.57 (*) 0.00 - 0.48 ug/mL-FEU  POCT I-STAT TROPONIN I     Status: None   Collection Time    12/23/12  2:48 PM      Result Value Range   Troponin i, poc 0.00  0.00 - 0.08 ng/mL   Comment 3           HEMOGLOBIN A1C     Status: Abnormal   Collection Time    12/23/12  4:57 PM      Result Value Range   Hemoglobin A1C 5.8 (*) <5.7 %   Mean Plasma Glucose 120 (*) <117 mg/dL  TROPONIN I     Status: None   Collection Time    12/23/12  4:59 PM      Result Value Range   Troponin I <0.30  <0.30 ng/mL  TROPONIN I     Status: None   Collection Time    12/23/12 11:00 PM      Result Value Range   Troponin I <0.30  <0.30 ng/mL  TROPONIN I     Status: None   Collection Time    12/24/12  5:05 AM      Result Value Range   Troponin I <0.30  <0.30 ng/mL  BASIC METABOLIC PANEL     Status: None   Collection Time    12/24/12  5:05 AM      Result Value Range   Sodium 141  135 - 145 mEq/L   Potassium 3.7   3.5 - 5.1 mEq/L   Chloride 107  96 - 112 mEq/L   CO2 27  19 - 32 mEq/L   Glucose, Bld 96  70 - 99 mg/dL   BUN 9  6 - 23 mg/dL   Creatinine, Ser 5.78  0.50 - 1.10 mg/dL   Calcium 8.8  8.4 - 46.9 mg/dL   GFR  calc non Af Amer >90  >90 mL/min   GFR calc Af Amer >90  >90 mL/min  LIPID PANEL     Status: None   Collection Time    12/24/12  5:05 AM      Result Value Range   Cholesterol 140  0 - 200 mg/dL   Triglycerides 93  <161 mg/dL   HDL 58  >09 mg/dL   Total CHOL/HDL Ratio 2.4     VLDL 19  0 - 40 mg/dL   LDL Cholesterol 63  0 - 99 mg/dL    Signed: Boykin Peek, MD 12/24/2012, 10:59 AM   Time Spent on Discharge: 45 minutes Services Ordered on Discharge: None Equipment Ordered on Discharge: None

## 2012-12-24 NOTE — H&P (Signed)
Date: 12/24/2012               Patient Name:  Kimberly Underwood MRN: [id number]  DOB: 08/21/1965 Age / Sex: 47 y.o., female   PCP: Ronnald Nian, MD              Medical Service: Internal Medicine Teaching Service     I have reviewed the note by Daun Peacock, MS 3 and was present during the interview and physical exam.  Please see below for findings, assessment, and plan.  Chief Complaint: Chest Pain  History of Present Illness: Kimberly Underwood is a 47 year old female with a past medical history of hypertension who woke up this morning, realized that she overslept and then began experiencing 5/10 mid sternal chest pain and mild tingling in her right arm that is worse on inspiration, but denies shortness of breath or diaphoresis. She noticed that the pain was accentuated when bending over to pick something up off of the floor. Upon arriving in the ED, she received aspirin 325 mg and nitroglycerin 0.4 mg and her chest pain improved to 4/10.  She has had prior episodes of chest pain that would resolve after a few minutes, were not associated with exercise and did not improve with rest. She has never smoked and drinks alcohol occasionally. She has known hypertension and says she takes her lisinopril-hydrochlorothiazide every day. She does not check her blood pressure at home.  She says the chest pain is worse on inspiration. She denies leg swelling or pain. A few days ago, she took a long car ride for a business trip, three hours each way in one day. Initial troponin values were negative. EKG showed normal sinus rhythm and was significant for possible atrial enlargement. Chest x-ray showed normal pulmonary vascularity, clear lungs without edema or consolidation, and borderline enlargement of the heart. A d-dimer was slightly elevated 0.57 ug/mL.    Meds: Current Facility-Administered Medications  Medication Dose Route Frequency Provider Last Rate Last Dose  . aspirin EC tablet 81 mg  81 mg Oral Daily  Judie Bonus, MD   81 mg at 12/24/12 0927  . heparin injection 5,000 Units  5,000 Units Subcutaneous Q8H Judie Bonus, MD   5,000 Units at 12/24/12 [phone]  . lisinopril (PRINIVIL,ZESTRIL) tablet 20 mg  20 mg Oral Daily Inez Catalina, MD   20 mg at 12/24/12 5621   And  . hydrochlorothiazide (MICROZIDE) capsule 12.5 mg  12.5 mg Oral Daily Inez Catalina, MD   12.5 mg at 12/24/12 3086  . morphine 2 MG/ML injection 1-4 mg  1-4 mg Intravenous Q4H PRN Judie Bonus, MD      . nitroGLYCERIN (NITROSTAT) SL tablet 0.4 mg  0.4 mg Sublingual Q5 min PRN Twana First Hess, DO   0.4 mg at 12/23/12 1145  . sodium chloride 0.9 % injection 3 mL  3 mL Intravenous Q12H Judie Bonus, MD   3 mL at 12/23/12 2125    Allergies: Allergies as of 12/23/2012  . (No Known Allergies)    Past Medical History: Medical Student note reviewed  Family History: Medical Student note reviewed  Social History: Medical Student note reviewed  Surgical History: Medical Student note reviewed  Review of System: Medical Student note reviewed  Physical Exam: Blood pressure 120/63, pulse 72, temperature 98 F (36.7 C), temperature source Oral, resp. rate 18, height 5\' 7"  (1.702 m), weight 99.791 kg (220 lb), SpO2 99.00%.  Constitutional: Vital signs reviewed.  Patient is a well-developed and well-nourished female in no acute distress and cooperative with exam. Alert and oriented x3.  Head: Normocephalic and atraumatic Eyes: PERRL, EOMI, conjunctivae normal, No scleral icterus.  Neck: Supple, Trachea midline .  Cardiovascular: RRR, S1 normal, S2 normal, no MRG, pulses symmetric and intact bilaterally Pulmonary/Chest: normal respiratory effort, CTAB, no wheezes, rales, or rhonchi Abdominal: Soft. Non-tender, non-distended, bowel sounds are normal, no masses, organomegaly, or guarding present.  Musculoskeletal: No joint deformities, erythema, or stiffness Neurological: cranial nerve II-XII are grossly  intact, no focal motor deficit, sensory intact to light touch bilaterally.  Skin: Warm, dry and intact. No rash, cyanosis, or clubbing.  Psychiatric: Normal mood and affect.    Labs: Reviewed as noted in the Electronic Record  Imaging: Reviewed as noted in the Electronic Record  Other results: EKG: normal EKG, normal sinus rhythm,   Assessment & Plan by Problem: Principal Problem:  #Atypical Chest pain: The patient has low risk of coronary artery disease with a TIMI score of 0. This is unlikely to be pulmonary embolism because although her chest pain is pleuritic, she denied unilateral leg swelling, tachycardia, hemoptysis, or history of cancer, recent prolonged immobilization. D-dimer was slightly elevated.  - Monitor overnight on telemetry  - Trend troponins  - Check lipid panel, Hb A1C for risk stratification   #Hypertension: Blood pressure stable in ED at 120/79.  - Continue lisinopril   #DVT Prophylaxis: Subcutaneous heparin TID   Dispo: Disposition is deferred at this time, awaiting improvement of current medical problems. Anticipated discharge in approximately 1 day(s).   The patient does have a current PCP (Ronnald Nian, MD) and does need an [facility] hospital follow-up appointment after discharge.  The patient does not have transportation limitations that hinder transportation to clinic appointments.  Signed: Boykin Peek, MD 12/24/2012, 9:34 AM

## 2012-12-24 NOTE — Progress Notes (Signed)
Medical Student Daily Progress Note  Subjective:  Kimberly Underwood is in good spirits today. Her chest pain has completely resolved and she had not had any shortness of breath, diaphoresis, or nausea. She feels that this episode may have been related to emotional stress secondary to her stressful job. 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   Objective:  Vital signs in last 24 hours:  Filed Vitals:    12/23/12 1500  12/23/12 1519  12/23/12 2133  12/24/12 0434   BP:  120/79  120/79  111/67  120/63   Pulse:  67  67  67  72   Temp:    98.1 F (36.7 C)  98 F (36.7 C)   TempSrc:    Oral  Oral   Resp:  17  20  18  18    Height:       Weight:       SpO2:  98%  99%  100%  99%    Weight change:   Intake/Output Summary (Last 24 hours) at 12/24/12 0943 Last data filed at 12/24/12 0900   Gross per 24 hour   Intake  240 ml   Output  0 ml   Net  240 ml    Physical Exam:  General: Well-appearing female in no acute distress  Cardiovascular: Regular rate and rhythm, no murmurs, rubs, or gallops  Pulmonary: Lungs clear to auscultation bilaterally  Abdomen: Normal bowel sounds, nontender to palpation  Extremities: No lower extremity edema, no tenderness, warmth, or erythema  Lab Results:  Basic Metabolic Panel:   Recent Labs  Lab  12/23/12 0941  12/24/12 0505   NA  134*  141   K  3.4*  3.7   CL  100  107   CO2  24  27   GLUCOSE  92  96   BUN  10  9   CREATININE  0.68  0.71   CALCIUM  9.5  8.8    CBC:   Recent Labs  Lab  12/23/12 0941   WBC  4.6   HGB  12.8   HCT  38.1   MCV  86.2   PLT  321    Cardiac Enzymes:   Recent Labs  Lab  12/23/12 1659  12/23/12 2300  12/24/12 0505   TROPONINI  <0.30  <0.30  <0.30    D-Dimer:   Recent Labs  Lab  12/23/12 1211   DDIMER  0.57*    Hemoglobin A1C:   Recent Labs  Lab   12/23/12 1657   HGBA1C  5.8*    Fasting Lipid Panel:  Lipid Panel    Component  Value  Date/Time    CHOL  140  12/24/2012 0505    TRIG  93  12/24/2012 0505    HDL  58  12/24/2012 0505    CHOLHDL  2.4  12/24/2012 0505    VLDL  19  12/24/2012 0505    LDLCALC  63  12/24/2012 0505    Studies/Results:  Dg Chest 2 View  12/23/2012 CLINICAL DATA: Chest pain EXAM: CHEST 2 VIEW COMPARISON: None. FINDINGS: Lungs are clear. Heart is borderline enlarged with normal pulmonary vascularity. No adenopathy. No pneumothorax. No bone lesions. . IMPRESSION: Heart borderline enlarged. No edema or consolidation. Electronically Signed By: Bretta Bang On: 12/23/2012 10:16   Medications:  Scheduled Meds:  .  aspirin EC  81 mg  Oral  Daily   .  heparin  5,000 Units  Subcutaneous  Q8H   .  lisinopril  20 mg  Oral  Daily    And   .  hydrochlorothiazide  12.5 mg  Oral  Daily   .  sodium chloride  3 mL  Intravenous  Q12H    Continuous Infusions:  PRN Meds:.morphine injection, nitroGLYCERIN   Assessment/Plan:  #Chest pain: The patient has low risk of coronary artery disease (TIMI score was 0), and unlikely to be PE (Geneva score 0). She has never noticed chest pain on exertion while performing her physically strenuous occupation, but she feels that yesterdays episode was related to the emotional stress that she experiences. We feel that she can continue to follow up as an outpatient with her primary care physician.  - Overnight telemetry was unremarkable  - Troponins were negative x 3  - Lipid panel was within normal limits  - Hb A1C 5.8   #Hypertension: Blood pressure stable in ED at 120/79.  - Continue lisinopril-hydrochlorothiazide   #DVT Prophylaxis: Subcutaneous heparin TID   Disposition: Discharge home today and schedule follow-up with PCP in one week.   LOS: 1 day  This is a Psychologist, occupational Note. The care of the patient was discussed with Dr. Delane Ginger and the assessment and plan formulated with their  assistance. Please see their attached note for official documentation of the daily encounter.

## 2012-12-24 NOTE — Progress Notes (Signed)
Utilization review completed.  

## 2012-12-24 NOTE — H&P (Signed)
INTERNAL MEDICINE TEACHING SERVICE Attending Admission Note  Date: 12/24/2012  Patient name: Kimberly Underwood  Medical record number: [id number]  Date of birth: 1965/06/16    I have seen and evaluated Sampson Si and discussed their care with the Residency Team.  47 yr old female w/ hx HTN presented with CP.  This occurred after she overslept for work.  She denies any hx of CP with exertion. She is quite active at work and does heavy lifting and has never had chest discomfort or SOB.  She received NTG in the ED and ASA with no real change in CP. EKG on admission shows to acute ST changes. She has ruled out for ACS with three negative Trop I. She is CP free this morning and states she feels great.  She denies any SOB, palpitations. When asked what makes her CP worse, she responds "just being at work". She states her job is stressful. Her revised Geneva score is 0.  TIMI score is 0.   She is medically stable for D/C. My suspicion for cardiac causes is low. My suspicion for a PE is low.  She can follow up with her PCP in 1-2 weeks. If this reoccurs, she was told to come back to the ED. She may have her PCP refer her to cardiology for stress testing as outpatient.  Jonah Blue, DO, FACP Faculty [facility] Internal Medicine Residency Program 12/24/2012, 11:10 AM

## 2012-12-24 NOTE — Progress Notes (Signed)
   I have seen the patient and reviewed the daily progress note by Daun Peacock MS 3 and discussed the care of the patient with them.  See below for documentation of my findings, assessment, and plans.  Subjective:  Pt feels well this am-not having any further chest pain.   Objective: Vital signs in last 24 hours: Filed Vitals:   12/23/12 1500 12/23/12 1519 12/23/12 2133 12/24/12 0434  BP: 120/79 120/79 111/67 120/63  Pulse: 67 67 67 72  Temp:   98.1 F (36.7 C) 98 F (36.7 C)  TempSrc:   Oral Oral  Resp: 17 20 18 18   Height:      Weight:      SpO2: 98% 99% 100% 99%   Weight change:   Intake/Output Summary (Last 24 hours) at 12/24/12 1031 Last data filed at 12/24/12 0900  Gross per 24 hour  Intake    240 ml  Output      0 ml  Net    240 ml   Constitutional: Vital signs reviewed.  Patient is a well-developed and well-nourished female in no acute distress and cooperative with exam. Alert and oriented x3.  Head: Normocephalic and atraumatic Eyes: PERRL, EOMI, conjunctivae normal, No scleral icterus.  Neck: Supple, Trachea midline .  Cardiovascular: RRR, S1 normal, S2 normal, no MRG, pulses symmetric and intact bilaterally Pulmonary/Chest: normal respiratory effort, CTAB, no wheezes, rales, or rhonchi Abdominal: Soft. Non-tender, non-distended, bowel sounds are normal, no masses, organomegaly, or guarding present.  Musculoskeletal: No joint deformities, erythema, or stiffness Neurological: A&O x3, cranial nerve II-XII are grossly intact, no focal motor deficit, sensory intact to light touch bilaterally.  Skin: Warm, dry and intact. No rash, cyanosis, or clubbing.  Psychiatric: Normal mood and affect.   Lab Results: Reviewed and documented in Electronic Record Micro Results: Reviewed and documented in Electronic Record Studies/Results: Reviewed and documented in Electronic Record Medications: I have reviewed the patient's current medications. Scheduled Meds: .  aspirin EC  81 mg Oral Daily  . heparin  5,000 Units Subcutaneous Q8H  . lisinopril  20 mg Oral Daily   And  . hydrochlorothiazide  12.5 mg Oral Daily  . sodium chloride  3 mL Intravenous Q12H   Continuous Infusions:  PRN Meds:.morphine injection, nitroGLYCERIN Assessment/Plan: Principal Problem:   Chest pain on breathing-Resolved. Active Problems:   HYPERTENSION-Continued home meds.   Dispo: Discharge today.  The patient does have a current PCP (Ronnald Nian, MD) and does need an [facility] hospital follow-up appointment after discharge.  .Services Needed at time of discharge: Y = Yes, Blank = No PT:   OT:   RN:   Equipment:   Other:     LOS: 1 day   Boykin Peek, MD 12/24/2012, 10:31 AM

## 2012-12-24 NOTE — Progress Notes (Signed)
Hospital Course by Problem List:  1. Chest pain:  Kimberly Underwood presented with chest pain that came on the morning of admission shortly after realizing that she had overslept for work. The pain was 5/10 midsternal, was associated with right arm tingling, and was worse on inspiration or when bending over. She received nitroglycerin and aspirin in the ED that did not significantly change her symptoms and she continued to endorse 4/10 midsternal chest pain. EKG showed no acute ST changes and three serial troponin I were negative. She was placed on telemetry overnight with no acute events. On the morning of discharge she was in good spirits, denying any further chest pain, shortness of breath, or palpitations. Her HbA1C and lipid panel were within normal limits. Geneva score 0. TIMI Score 0. Low suspicion for cardiac cause of chest pain, so we recommended that she follow up with her primary care physician in 1-2 weeks. She knows that if she has further episodes of chest pain that she should return to the ED.  2. Hypertension: The patient's blood pressure was stable throughout the hospital stay. We continued her lisinopril and hydrochlorothiazide.

## 2012-12-26 NOTE — Progress Notes (Signed)
I agree with the medical student's note.  

## 2012-12-27 NOTE — Discharge Summary (Signed)
  Date: 12/27/2012  Patient name: Kimberly Underwood  Medical record number: [id number]  Date of birth: 03-16-1966   This patient has been discussed with the house staff. Please see their note for complete details. I concur with their findings and plan.  Jonah Blue, DO, FACP Faculty [facility] Internal Medicine Residency Program 12/27/2012, 6:37 AM

## 2012-12-28 ENCOUNTER — Encounter: Payer: Self-pay | Admitting: Medical

## 2012-12-28 ENCOUNTER — Ambulatory Visit (INDEPENDENT_AMBULATORY_CARE_PROVIDER_SITE_OTHER): Payer: BC Managed Care – PPO | Admitting: Medical

## 2012-12-28 VITALS — BP 128/80 | HR 72 | Temp 97.8°F | Resp 16 | Wt 215.0 lb

## 2012-12-28 DIAGNOSIS — F411 Generalized anxiety disorder: Secondary | ICD-10-CM

## 2012-12-28 DIAGNOSIS — M7520 Bicipital tendinitis, unspecified shoulder: Secondary | ICD-10-CM

## 2012-12-28 DIAGNOSIS — R0789 Other chest pain: Secondary | ICD-10-CM

## 2012-12-28 DIAGNOSIS — F419 Anxiety disorder, unspecified: Secondary | ICD-10-CM

## 2012-12-28 DIAGNOSIS — M7521 Bicipital tendinitis, right shoulder: Secondary | ICD-10-CM

## 2012-12-28 MED ORDER — CITALOPRAM HYDROBROMIDE 20 MG PO TABS: ORAL_TABLET | ORAL | Status: DC

## 2012-12-28 MED ORDER — ALPRAZOLAM 0.25 MG PO TABS: 0.2500 mg | ORAL_TABLET | Freq: Two times a day (BID) | ORAL | Status: DC | PRN

## 2012-12-28 NOTE — Progress Notes (Signed)
Subjective: Here for hospital f/u.  Was hospitalized recently with chest pain.  Was advised that cause was likely anxiety, but told to f/u here . She had another episode of chest tightness and arm pain today.   She notes in the last several weeks, having significantly increased work Counselling psychologist.  She lifts heavy loads at work, is in a supervisory role, and lately work has been quite stressful.  Today while at work had an episode of chest tightness and right arm pain.  It was a sharp pain and tingle that resolved after about 10 minutes.  Worsened by lifting the heavy items she lifts at work throughout the day.  At the time of the chest tightness, denies having sweats, nausea, numbness, dizziness or SOB.  She had cardiac workup at the hospital, but no prior or current stress test.     She continues to have intermittent pain of right arm at the shoulder.  She reports right shoulder pain, worse lateral shoulder, worse with activity on the job.  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  Works at [employer].  Denies numbness, weakness, tingling in the right arm.  She is right handed.   No other aggravating or relieving factors.    No other c/o.  The following portions of the patient's history were reviewed and updated as appropriate: allergies, current medications, past family history, past medical history, past social history, past surgical history and problem list.  Past Medical History  Diagnosis Date  . CARPAL TUNNEL SYNDROME, BILATERAL 05/22/2010  . HYPERTENSION 10/25/2008  . Dental caries   . Arthritis     "little in both legs" (12/23/2012)  . Gout   . Chest pain     "just today; when I woke up" (12/23/2012)    ROS Otherwise as in subjective above  Objective: Physical Exam  BP 128/80  Pulse 72  Temp(Src) 97.8 F (36.6 C) (Oral)  Resp 16  Wt 215 lb (97.523 kg)  BMI 33.67 kg/m2  General appearance: alert, seems a little anxious,  WD/WN, AA female Neck: supple, nontender, no lymphadenopathy, no thyromegaly, no masses, normal ROM Heart: RRR, normal S1, S2, no murmurs Lungs: clear Pulses: 2+ pulses, normal cap refill Ext: no edema MSK: tender right biceps tendon origin and lateral and anterior right deltoid, otherwise nontender shoulder, no swelling or deformity.  Pain with right shoulder abduction and flexion over 90 degrees, slight decreased ROM of right ext/int ROM, rest of UE unremarkable Back: nontender Neuro: UE normal strength and DTRs   Adult ECG Report  Indication: chest tightness  Rate: 73 bpm  Rhythm: normal sinus rhythm  QRS Axis: 13 degrees  PR Interval:  QRS Duration: 76ms  QTc:  Conduction Disturbances: none  Other Abnormalities: none  Patient's cardiac risk factors are: hypertension.  EKG comparison: 9/14 unchanged   Narrative Interpretation: normal EKG   Assessment: Encounter Diagnoses  Name Primary?  . Chest tightness Yes  . Anxiety   . Biceps tendonitis, right     Plan: Chest tightness - reviewed recent ED report, labs, EKG, CXR.  Etiology was thought to be anxiety.  At this point, EKG unchanged, and anxiety still seems to be likely diagnosis.   Anxiety- discussed her symptoms, stressors, and will begin treatment for anxiety.  Discussed stress reduction, sleep, ways to deal with acute anxiety.  Begin Xanax prn short term. Discussed risk and benefits of medications.  Also begin Citalopram QHS.  Recheck 2wk, sooner prn.   Biceps tendonitis -advised rest, ice, c/t Naprelan, and note for work for restrictions x 10 days.

## 2012-12-28 NOTE — Patient Instructions (Signed)
Anxiety and Panic Attacks Your caregiver has informed you that you are having an anxiety or panic attack. There may be many forms of this. Most of the time these attacks come suddenly and without warning. They come at any time of day, including periods of sleep, and at any time of life. They may be strong and unexplained. Although panic attacks are very scary, they are physically harmless. Sometimes the cause of your anxiety is not known. Anxiety is a protective mechanism of the body in its fight or flight mechanism. Most of these perceived danger situations are actually nonphysical situations (such as anxiety over losing a job). CAUSES  The causes of an anxiety or panic attack are many. Panic attacks may occur in otherwise healthy people given a certain set of circumstances. There may be a genetic cause for panic attacks. Some medications may also have anxiety as a side effect. SYMPTOMS  Some of the most common feelings are:  Intense terror.   Dizziness, feeling faint.   Hot and cold flashes.   Fear of going crazy.   Feelings that nothing is real.   Sweating.   Shaking.   Chest pain or a fast heartbeat (palpitations).   Smothering, choking sensations.   Feelings of impending doom and that death is near.   Tingling of extremities, this may be from over-breathing.   Altered reality (derealization).   Being detached from yourself (depersonalization).  Several symptoms can be present to make up anxiety or panic attacks. DIAGNOSIS  The evaluation by your caregiver will depend on the type of symptoms you are experiencing. The diagnosis of anxiety or panic attack is made when no physical illness can be determined to be a cause of the symptoms. TREATMENT  Treatment to prevent anxiety and panic attacks may include:  Avoidance of circumstances that cause anxiety.   Reassurance and relaxation.   Regular exercise.   Relaxation therapies, such as yoga.   Psychotherapy with a  psychiatrist or therapist.   Avoidance of caffeine, alcohol and illegal drugs.   Prescribed medication.  SEEK IMMEDIATE MEDICAL CARE IF:   You experience panic attack symptoms that are different than your usual symptoms.   You have any worsening or concerning symptoms.  Document Released: 03/24/2005 Document Revised: 12/04/2010 Document Reviewed: 07/26/2009 [facility] Patient Information 2012 [employer], [place].   SYMPTOMS OF A HEART ATTACK The most common signs and symptoms include:   Tightness or squeezing in the chest.   Feeling of heaviness on the chest.   Discomfort in the arms, neck, or jaw.   Shortness of breath and nausea.   Cold, wet skin.   Chest pain or discomfort brought on by physical effort or excitement which increase the oxygen needs of the heart.   SEEK IMMEDIATE MEDICAL CARE IF:   You develop nausea, vomiting, or shortness of breath.   You feel faint, lightheaded, or pass out.   Your chest discomfort gets worse.   You are sweating or experience sudden profound fatigue.   Your discomfort lasts longer than 15 minutes.   If you have a history of heart disease or angina, you should tell your caregiver right away about any increase in the severity or frequency of your chest discomfort or angina attacks. When you have angina, you should stop what you are doing and sit down. This may bring relief in 3 to 5 minutes. If your caregiver has prescribed nitro, take it as directed.   WHAT IS A HEART ATTACK: Myocardial Infarction A myocardial infarction (  MI) is damage to the heart that is not reversible. It is also called a heart attack. An MI usually occurs when a heart (coronary) artery becomes blocked or narrowed. This cuts off the blood supply to the heart. When one or more of the heart (coronary) arteries becomes blocked, that area of the heart begins to die. This causes pain felt during an MI.  If you think you might be having an MI, call your local emergency  services immediately (911 in [place] It is recommended that you take a 162 mg non-enteric coated aspirin if you do not have an aspirin allergy. Do not drive yourself to the hospital or wait to see if your symptoms go away. The sooner MI is treated, the greater the amount of heart muscle saved. Time is muscle. It can save your life. CAUSES  An MI can occur from:  A gradual buildup of a fatty substance called plaque. When plaque builds up in the arteries, this condition is called atherosclerosis. This buildup can block or reduce the blood supply to the heart artery(s).   A sudden plaque rupture within a heart artery that causes a blood clot (thrombus). A blood clot can block the heart artery which does not allow blood flow to the heart.   A severe tightening (spasm) of the heart artery. This is a less common cause of a heart attack. When a heart artery spasms, it cuts off blood flow through the artery. Spasms can occur in heart arteries that do not have atherosclerosis.  RISK FACTORS People at risk for an MI usually have one or more risk factors, such as:  High blood pressure.   High cholesterol.   Smoking.   Gender. Men have a higher heart attack risk.   Overweight/obesity.   Age.   Family history.   Lack of exercise.   Diabetes.   Stress.   Excessive alcohol use.   Street drug use (cocaine and methamphetamines).

## 2012-12-30 ENCOUNTER — Telehealth: Payer: Self-pay | Admitting: Medical

## 2012-12-30 NOTE — Telephone Encounter (Signed)
Pt stopped by and dropped off fmla paper work. She states that due to the nature of her job they have taken her out of work. They requested that FMLA papers be filled out concerning this issue. They stated they would only let her return to work when it was stated that it was safe. If you have any questions please call the pt. Please send forms to employer the contact info is attached to forms. Please fax and then call pt when ready. Sending forms back to chaundra per office protocol.

## 2012-12-31 NOTE — Telephone Encounter (Signed)
I filled out my part and put her forms on your desk. CLS

## 2012-12-31 NOTE — Telephone Encounter (Signed)
I wrote her for light duty, I didn't taker her out of work.  Thus, if her employer is making her stay out due to the shoulder issue, then she will need f/u by mid next week.   Since we haven't identified a problem yet that will necessarily require multiple visits, f/u, etc, I can't complete the FMLA until f/u.

## 2013-01-07 ENCOUNTER — Telehealth: Payer: Self-pay | Admitting: Family Medicine

## 2013-01-07 ENCOUNTER — Ambulatory Visit (INDEPENDENT_AMBULATORY_CARE_PROVIDER_SITE_OTHER): Payer: BC Managed Care – PPO | Admitting: Medical

## 2013-01-07 ENCOUNTER — Encounter: Payer: Self-pay | Admitting: Medical

## 2013-01-07 VITALS — BP 118/80 | HR 76 | Temp 97.8°F | Resp 16 | Wt 216.0 lb

## 2013-01-07 DIAGNOSIS — F411 Generalized anxiety disorder: Secondary | ICD-10-CM

## 2013-01-07 DIAGNOSIS — M67919 Unspecified disorder of synovium and tendon, unspecified shoulder: Secondary | ICD-10-CM

## 2013-01-07 DIAGNOSIS — R0789 Other chest pain: Secondary | ICD-10-CM

## 2013-01-07 DIAGNOSIS — M25519 Pain in unspecified shoulder: Secondary | ICD-10-CM

## 2013-01-07 DIAGNOSIS — M778 Other enthesopathies, not elsewhere classified: Secondary | ICD-10-CM

## 2013-01-07 NOTE — Progress Notes (Signed)
Subjective: Here for two-week followup. At last visit we started citalopram for anxiety and chest tightness with increased stress. She is doing much better on citalopram, no more chest tightness, overall seems to be doing okay. She tried the Xanax but had no change with that medication.  She denies any specific panic attacks.  She went back to work with the work restrictions I wrote, but her employer made her go out of work until she follows up here.  She is here for followup on the anxiety and shoulder issues.  Her shoulder do feel some improvement after resting them in the past week and using medication and ice, however she still has a moderate amount of pain in both shoulders, right worse than left.    She continues to have intermittent pain of right and left arm at the shoulder.  She reports right shoulder pain, worse lateral shoulder, worse with activity on the job.  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  Works at [employer].  Denies numbness, weakness, tingling in the right arm.  She is right handed.   No other aggravating or relieving factors.    No other c/o.  The following portions of the patient's history were reviewed and updated as appropriate: allergies, current medications, past family history, past medical history, past social history, past surgical history and problem list.  Past Medical History  Diagnosis Date  . CARPAL TUNNEL SYNDROME, BILATERAL 05/22/2010  . HYPERTENSION 10/25/2008  . Dental caries   . Arthritis     "little in both legs" (12/23/2012)  . Gout   . Chest pain     "just today; when I woke up" (12/23/2012)    ROS Otherwise as in subjective above  Objective: Physical Exam  BP 118/80  Pulse 76  Temp(Src) 97.8 F (36.6 C) (Oral)  Resp 16  Wt 216 lb (97.977 kg)  BMI 33.82 kg/m2  General appearance: alert, WD/WN, AA female Neck: supple, nontender, no lymphadenopathy, no thyromegaly, no masses, normal  ROM Pulses: 2+ pulses, normal cap refill Ext: no edema MSK: Tender over bilateral biceps tendon origin and lateral deltoids, but improved compared to last visit, still has some pain with bilateral shoulder abduction and flexion but improved compared to last visit, still has a little decreased range of motion of right external and internal range of motion, otherwise upper extremity unremarkable bilateral Back: nontender Neuro: UE normal strength and DTRs Psych: Pleasant, smiling, answers questions appropriately  Assessment: Encounter Diagnoses  Name Primary?  . Generalized anxiety disorder Yes  . Pain in joint, shoulder region, unspecified laterality   . Tendonitis of shoulder, unspecified laterality   . Chest tightness     Plan: Anxiety- discussed her symptoms, stressors, and seens to have had significant improvement in her symptoms after starting Citalopram.  She saw no change taking the Xanax once, advise she can take 2 at a time if needed for panic attack.  Discussed stress reduction, sleep, ways to deal with acute anxiety. Continue Citalopram QHS.    Biceps tendonitis, shoulder pain bilateral -c/t Naprelan, relative rest, ice, and referral to physical therapy. I did release her back to work.  Chest tightness -resolved  Followup in 2-3 weeks

## 2013-01-07 NOTE — Telephone Encounter (Signed)
Message copied by Janeice Robinson on Fri Jan 07, 2013  1:55 PM ------      Message from: Jac Canavan      Created: Fri Jan 07, 2013 11:04 AM       Refer to PT for shoulder tendonitis, [facility] or [facility].  In addition to eval and treatment, specify Iontophoresis as well. ------

## 2013-01-07 NOTE — Telephone Encounter (Signed)
I fax her referral over to [facility] and they will contact the patient for the appointment. CLS [phone]

## 2013-01-14 ENCOUNTER — Telehealth: Payer: Self-pay | Admitting: Medical

## 2013-01-14 NOTE — Telephone Encounter (Signed)
Error Pt has already come in for appt-lm

## 2013-07-05 ENCOUNTER — Other Ambulatory Visit (HOSPITAL_COMMUNITY): Payer: Self-pay | Admitting: Obstetrics and Gynecology

## 2013-07-05 DIAGNOSIS — Z1231 Encounter for screening mammogram for malignant neoplasm of breast: Secondary | ICD-10-CM

## 2013-07-13 ENCOUNTER — Ambulatory Visit (HOSPITAL_COMMUNITY)
Admission: RE | Admit: 2013-07-13 | Discharge: 2013-07-13 | Disposition: A | Discharge status: Home / Self Care | Payer: BC Managed Care – PPO | Source: Ambulatory Visit | Attending: Obstetrics and Gynecology | Admitting: Obstetrics and Gynecology

## 2013-07-13 DIAGNOSIS — Z1231 Encounter for screening mammogram for malignant neoplasm of breast: Secondary | ICD-10-CM

## 2013-10-26 ENCOUNTER — Other Ambulatory Visit: Payer: Self-pay | Admitting: Medical

## 2013-10-26 NOTE — Telephone Encounter (Signed)
Patient needs to schedule a follow up visit Asap on medications

## 2013-11-27 ENCOUNTER — Other Ambulatory Visit: Payer: Self-pay | Admitting: Medical

## 2013-11-29 ENCOUNTER — Telehealth: Payer: Self-pay | Admitting: Medical

## 2013-11-29 ENCOUNTER — Other Ambulatory Visit: Payer: Self-pay | Admitting: Family Medicine

## 2013-11-29 MED ORDER — LISINOPRIL-HYDROCHLOROTHIAZIDE 20-12.5 MG PO TABS: 1.0000 | ORAL_TABLET | Freq: Every day | ORAL | Status: DC

## 2013-11-29 NOTE — Telephone Encounter (Signed)
Rx was sent to her pharmacy. CLS 

## 2013-12-01 ENCOUNTER — Encounter: Payer: BC Managed Care – PPO | Admitting: Medical

## 2013-12-27 ENCOUNTER — Encounter: Payer: Self-pay | Admitting: Family Medicine

## 2013-12-27 ENCOUNTER — Ambulatory Visit (INDEPENDENT_AMBULATORY_CARE_PROVIDER_SITE_OTHER): Payer: BC Managed Care – PPO | Admitting: Family Medicine

## 2013-12-27 VITALS — BP 144/90 | HR 78 | Ht 67.0 in | Wt 238.0 lb

## 2013-12-27 DIAGNOSIS — Z79899 Other long term (current) drug therapy: Secondary | ICD-10-CM

## 2013-12-27 DIAGNOSIS — I10 Essential (primary) hypertension: Secondary | ICD-10-CM | POA: Insufficient documentation

## 2013-12-27 DIAGNOSIS — E669 Obesity, unspecified: Secondary | ICD-10-CM

## 2013-12-27 DIAGNOSIS — Z23 Encounter for immunization: Secondary | ICD-10-CM

## 2013-12-27 LAB — CBC WITH DIFFERENTIAL/PLATELET
BASOS ABS: 0 10*3/uL (ref 0.0–0.1)
Basophils Relative: 0 % (ref 0–1)
EOS ABS: 0.1 10*3/uL (ref 0.0–0.7)
EOS PCT: 1 % (ref 0–5)
HCT: 35.4 % — ABNORMAL LOW (ref 36.0–46.0)
Hemoglobin: 12.5 g/dL (ref 12.0–15.0)
Lymphocytes Relative: 34 % (ref 12–46)
Lymphs Abs: 2.4 10*3/uL (ref 0.7–4.0)
MCH: 28.5 pg (ref 26.0–34.0)
MCHC: 35.3 g/dL (ref 30.0–36.0)
MCV: 80.8 fL (ref 78.0–100.0)
Monocytes Absolute: 0.5 10*3/uL (ref 0.1–1.0)
Monocytes Relative: 7 % (ref 3–12)
NEUTROS PCT: 58 % (ref 43–77)
Neutro Abs: 4.2 10*3/uL (ref 1.7–7.7)
PLATELETS: 356 10*3/uL (ref 150–400)
RBC: 4.38 MIL/uL (ref 3.87–5.11)
RDW: 13.7 % (ref 11.5–15.5)
WBC: 7.2 10*3/uL (ref 4.0–10.5)

## 2013-12-27 NOTE — Progress Notes (Signed)
   Subjective:    Patient ID: Kimberly Underwood, female    DOB: April 16, 1965, 48 y.o.   MRN: [id number]  HPI She is here for medication followup. She continues on her blood pressure medication and is having no difficulty with that. Her weight does slowly increasing. She is no longer on Celexa or using Xanax. She states psychologically she is on a much better place she is not letting things bother her as much is she did in the past.   Review of Systems     Objective:   Physical Exam alert and in no distress. Tympanic membranes and canals are normal. Throat is clear. Tonsils are normal. Neck is supple without adenopathy or thyromegaly. Cardiac exam shows a regular sinus rhythm without murmurs or gallops. Lungs are clear to auscultation.        Assessment & Plan:  Essential hypertension, benign - Plan: CBC with Differential, Comprehensive metabolic panel  Obesity (BMI 30-39.9) - Plan: CBC with Differential, Comprehensive metabolic panel, Lipid panel  Need for prophylactic vaccination and inoculation against influenza - Plan: Flu Vaccine QUAD 36+ mos IM  Encounter for long-term (current) use of other medications - Plan: CBC with Differential, Comprehensive metabolic panel, Lipid panel  HYPERTENSION  itching on present medications. Discussed diet and exercise with her. Complemented her on coming off her Celexa and Xanax.

## 2013-12-28 LAB — COMPREHENSIVE METABOLIC PANEL
ALK PHOS: 69 U/L (ref 39–117)
ALT: 9 U/L (ref 0–35)
AST: 15 U/L (ref 0–37)
Albumin: 4.1 g/dL (ref 3.5–5.2)
BILIRUBIN TOTAL: 0.4 mg/dL (ref 0.2–1.2)
BUN: 11 mg/dL (ref 6–23)
CO2: 25 mEq/L (ref 19–32)
CREATININE: 0.78 mg/dL (ref 0.50–1.10)
Calcium: 9.9 mg/dL (ref 8.4–10.5)
Chloride: 103 mEq/L (ref 96–112)
Glucose, Bld: 83 mg/dL (ref 70–99)
Potassium: 3.8 mEq/L (ref 3.5–5.3)
Sodium: 137 mEq/L (ref 135–145)
Total Protein: 7.2 g/dL (ref 6.0–8.3)

## 2013-12-28 LAB — LIPID PANEL
CHOL/HDL RATIO: 2.5 ratio
Cholesterol: 139 mg/dL (ref 0–200)
HDL: 55 mg/dL (ref >39)
LDL CALC: 60 mg/dL (ref 0–99)
Triglycerides: 122 mg/dL (ref <150)
VLDL: 24 mg/dL (ref 0–40)

## 2013-12-30 ENCOUNTER — Other Ambulatory Visit: Payer: Self-pay | Admitting: Medical

## 2014-07-07 ENCOUNTER — Ambulatory Visit (INDEPENDENT_AMBULATORY_CARE_PROVIDER_SITE_OTHER): Payer: BLUE CROSS/BLUE SHIELD | Admitting: Medical

## 2014-07-07 ENCOUNTER — Encounter: Payer: Self-pay | Admitting: Medical

## 2014-07-07 VITALS — BP 110/78 | HR 93 | Temp 97.9°F | Resp 14 | Wt 239.0 lb

## 2014-07-07 DIAGNOSIS — M7989 Other specified soft tissue disorders: Secondary | ICD-10-CM

## 2014-07-07 DIAGNOSIS — M7521 Bicipital tendinitis, right shoulder: Secondary | ICD-10-CM

## 2014-07-07 DIAGNOSIS — M25511 Pain in right shoulder: Secondary | ICD-10-CM

## 2014-07-07 DIAGNOSIS — R609 Edema, unspecified: Secondary | ICD-10-CM

## 2014-07-07 DIAGNOSIS — Z9289 Personal history of other medical treatment: Secondary | ICD-10-CM

## 2014-07-07 HISTORY — DX: Personal history of other medical treatment: Z92.89

## 2014-07-07 MED ORDER — NAPROXEN 375 MG PO TABS: 375.0000 mg | ORAL_TABLET | Freq: Two times a day (BID) | ORAL | Status: DC

## 2014-07-07 MED ORDER — TRAMADOL HCL 50 MG PO TABS: 50.0000 mg | ORAL_TABLET | Freq: Four times a day (QID) | ORAL | Status: DC | PRN

## 2014-07-07 NOTE — Progress Notes (Signed)
Subjective: Here for ongoing right shoulder pains.  I have seen her in the past for similar back in 2014.  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   Gets soreness of right shoulder.   Lately really been aggravated within the shoulder and she is doing a different job than her normal.  Been having some swelling and pain in feet. Stands prolonged on cement floors at work. Works 8 hour days standing on hard concrete.  From time to time feels like knees give out.   Works at [employer], makes memory foam.  No other aggravating or relieving factors. No other complaint.   Past Medical History  Diagnosis Date  . CARPAL TUNNEL SYNDROME, BILATERAL 05/22/2010  . HYPERTENSION 10/25/2008  . Dental caries   . Arthritis     "little in both legs" (12/23/2012)  . Gout   . Chest pain     "just today; when I woke up" (12/23/2012)   ROS as in subjective  Objective: BP 110/78 mmHg  Pulse 93  Temp(Src) 97.9 F (36.6 C) (Oral)  Resp 14  Wt 239 lb (108.41 kg)  BP Readings from Last 3 Encounters:  07/07/14 110/78  12/27/13 144/90  01/07/13 118/80   Wt Readings from Last 3 Encounters:  07/07/14 239 lb (108.41 kg)  12/27/13 238 lb (107.956 kg)  01/07/13 216 lb (97.977 kg)    General appearance: alert, no distress, WD/WN Neck: supple, no lymphadenopathy, no thyromegaly, no masses Heart: RRR, normal S1, S2, no murmurs Lungs: CTA bilaterally, no wheezes, rhonchi, or rales MSK: Tender over right biceps tendon origin, pain in the same area with shoulder and arm range of motion over 90, decreased internal and external Rotation of the right arm due to same pain, otherwise upper extremity exam unremarkable.  Legs nontender, no deformity normal range of motion No obvious swelling of either extremity Arms and legs neurovascularly intact   Assessment: Encounter Diagnoses  Name Primary?  . Biceps tendonitis  on right Yes  . Right shoulder pain   . Edema   . Leg swelling     Plan: Biceps tendinitis, right shoulder pain-advised relative rest, gave note for work restrictions for the next 7 days, begin Naprosyn twice daily, ice daily, arm sling at times when possible, follow-up in 1-2 weeks  Edema/leg swelling-none appreciable today but likely due to prolonged standing on concrete all day. Begin prescription TED hose, feet elevation in the evenings, needs to do better with diet and salt intake

## 2014-07-13 ENCOUNTER — Telehealth: Payer: Self-pay | Admitting: Family Medicine

## 2014-07-13 NOTE — Telephone Encounter (Signed)
Pt says she fell over on already injured arm yesterday while at gym. Pt wants to know to know if Vincenza HewsShane will extend light duty at work

## 2014-07-13 NOTE — Telephone Encounter (Signed)
Last visit I had requested 1-2 wk f/u.  So lets plan f/u and we can discuss and find out why she fell out and how symptom are compared to last visit.

## 2014-07-16 ENCOUNTER — Encounter (HOSPITAL_COMMUNITY): Payer: Self-pay | Admitting: Emergency Medicine

## 2014-07-16 ENCOUNTER — Observation Stay (HOSPITAL_COMMUNITY)
Admission: EM | Admit: 2014-07-16 | Discharge: 2014-07-17 | Disposition: A | Discharge status: Home / Self Care | Payer: BLUE CROSS/BLUE SHIELD | Attending: Internal Medicine | Admitting: Internal Medicine

## 2014-07-16 ENCOUNTER — Emergency Department (HOSPITAL_COMMUNITY): Payer: BLUE CROSS/BLUE SHIELD

## 2014-07-16 DIAGNOSIS — E669 Obesity, unspecified: Secondary | ICD-10-CM | POA: Diagnosis present

## 2014-07-16 DIAGNOSIS — R42 Dizziness and giddiness: Secondary | ICD-10-CM | POA: Insufficient documentation

## 2014-07-16 DIAGNOSIS — G5602 Carpal tunnel syndrome, left upper limb: Secondary | ICD-10-CM | POA: Diagnosis not present

## 2014-07-16 DIAGNOSIS — G5601 Carpal tunnel syndrome, right upper limb: Secondary | ICD-10-CM | POA: Insufficient documentation

## 2014-07-16 DIAGNOSIS — R55 Syncope and collapse: Principal | ICD-10-CM

## 2014-07-16 DIAGNOSIS — Z79899 Other long term (current) drug therapy: Secondary | ICD-10-CM | POA: Insufficient documentation

## 2014-07-16 DIAGNOSIS — G5600 Carpal tunnel syndrome, unspecified upper limb: Secondary | ICD-10-CM | POA: Diagnosis present

## 2014-07-16 DIAGNOSIS — M1990 Unspecified osteoarthritis, unspecified site: Secondary | ICD-10-CM

## 2014-07-16 DIAGNOSIS — I10 Essential (primary) hypertension: Secondary | ICD-10-CM | POA: Diagnosis present

## 2014-07-16 DIAGNOSIS — Z791 Long term (current) use of non-steroidal anti-inflammatories (NSAID): Secondary | ICD-10-CM | POA: Insufficient documentation

## 2014-07-16 DIAGNOSIS — I209 Angina pectoris, unspecified: Secondary | ICD-10-CM | POA: Diagnosis present

## 2014-07-16 DIAGNOSIS — R079 Chest pain, unspecified: Secondary | ICD-10-CM

## 2014-07-16 DIAGNOSIS — M109 Gout, unspecified: Secondary | ICD-10-CM | POA: Diagnosis not present

## 2014-07-16 LAB — BASIC METABOLIC PANEL
ANION GAP: 8 (ref 5–15)
BUN: 13 mg/dL (ref 6–23)
CALCIUM: 9.2 mg/dL (ref 8.4–10.5)
CHLORIDE: 103 mmol/L (ref 96–112)
CO2: 24 mmol/L (ref 19–32)
Creatinine, Ser: 0.8 mg/dL (ref 0.50–1.10)
GFR calc Af Amer: >90 mL/min (ref >90)
GFR calc non Af Amer: 85 mL/min — ABNORMAL LOW (ref >90)
Glucose, Bld: 123 mg/dL — ABNORMAL HIGH (ref 70–99)
POTASSIUM: 3.5 mmol/L (ref 3.5–5.1)
Sodium: 135 mmol/L (ref 135–145)

## 2014-07-16 LAB — CBC
HCT: 38.4 % (ref 36.0–46.0)
Hemoglobin: 13 g/dL (ref 12.0–15.0)
MCH: 28.8 pg (ref 26.0–34.0)
MCHC: 33.9 g/dL (ref 30.0–36.0)
MCV: 85 fL (ref 78.0–100.0)
Platelets: 355 10*3/uL (ref 150–400)
RBC: 4.52 MIL/uL (ref 3.87–5.11)
RDW: 12.9 % (ref 11.5–15.5)
WBC: 8.7 10*3/uL (ref 4.0–10.5)

## 2014-07-16 LAB — I-STAT TROPONIN, ED: Troponin i, poc: 0 ng/mL (ref 0.00–0.08)

## 2014-07-16 NOTE — ED Notes (Signed)
Pt reports 30 mins ago she had a sharp pain in mid chest that caused her to pass out. She hit her R arm when she fell. Pt sts this happened once before when she was under a lot of stress. sts she has been stressed lately. Currently pt still has some cp and is dizzy. Denies sob, nv.

## 2014-07-17 ENCOUNTER — Emergency Department (HOSPITAL_COMMUNITY): Payer: BLUE CROSS/BLUE SHIELD

## 2014-07-17 ENCOUNTER — Inpatient Hospital Stay (HOSPITAL_COMMUNITY): Payer: BLUE CROSS/BLUE SHIELD

## 2014-07-17 ENCOUNTER — Encounter (HOSPITAL_COMMUNITY): Payer: Self-pay | Admitting: *Deleted

## 2014-07-17 DIAGNOSIS — G5601 Carpal tunnel syndrome, right upper limb: Secondary | ICD-10-CM | POA: Diagnosis not present

## 2014-07-17 DIAGNOSIS — M109 Gout, unspecified: Secondary | ICD-10-CM | POA: Insufficient documentation

## 2014-07-17 DIAGNOSIS — G5602 Carpal tunnel syndrome, left upper limb: Secondary | ICD-10-CM | POA: Diagnosis not present

## 2014-07-17 DIAGNOSIS — I10 Essential (primary) hypertension: Secondary | ICD-10-CM | POA: Diagnosis not present

## 2014-07-17 DIAGNOSIS — R079 Chest pain, unspecified: Secondary | ICD-10-CM

## 2014-07-17 DIAGNOSIS — IMO0001 Reserved for inherently not codable concepts without codable children: Secondary | ICD-10-CM

## 2014-07-17 DIAGNOSIS — E669 Obesity, unspecified: Secondary | ICD-10-CM | POA: Diagnosis not present

## 2014-07-17 DIAGNOSIS — R55 Syncope and collapse: Principal | ICD-10-CM

## 2014-07-17 DIAGNOSIS — R4020 Unspecified coma: Secondary | ICD-10-CM | POA: Insufficient documentation

## 2014-07-17 DIAGNOSIS — M1990 Unspecified osteoarthritis, unspecified site: Secondary | ICD-10-CM | POA: Diagnosis present

## 2014-07-17 DIAGNOSIS — I209 Angina pectoris, unspecified: Secondary | ICD-10-CM | POA: Diagnosis present

## 2014-07-17 DIAGNOSIS — Z9289 Personal history of other medical treatment: Secondary | ICD-10-CM

## 2014-07-17 HISTORY — DX: Reserved for inherently not codable concepts without codable children: IMO0001

## 2014-07-17 HISTORY — DX: Personal history of other medical treatment: Z92.89

## 2014-07-17 LAB — COMPREHENSIVE METABOLIC PANEL
ALT: 11 U/L (ref 0–35)
ANION GAP: 7 (ref 5–15)
AST: 14 U/L (ref 0–37)
Albumin: 3.3 g/dL — ABNORMAL LOW (ref 3.5–5.2)
Alkaline Phosphatase: 63 U/L (ref 39–117)
BUN: 9 mg/dL (ref 6–23)
CHLORIDE: 108 mmol/L (ref 96–112)
CO2: 24 mmol/L (ref 19–32)
Calcium: 8.7 mg/dL (ref 8.4–10.5)
Creatinine, Ser: 0.77 mg/dL (ref 0.50–1.10)
GFR calc Af Amer: >90 mL/min (ref >90)
GFR calc non Af Amer: >90 mL/min (ref >90)
GLUCOSE: 98 mg/dL (ref 70–99)
Potassium: 3.6 mmol/L (ref 3.5–5.1)
SODIUM: 139 mmol/L (ref 135–145)
TOTAL PROTEIN: 6.4 g/dL (ref 6.0–8.3)
Total Bilirubin: 0.6 mg/dL (ref 0.3–1.2)

## 2014-07-17 LAB — CBC
HEMATOCRIT: 37.4 % (ref 36.0–46.0)
Hemoglobin: 12.4 g/dL (ref 12.0–15.0)
MCH: 28.5 pg (ref 26.0–34.0)
MCHC: 33.2 g/dL (ref 30.0–36.0)
MCV: 86 fL (ref 78.0–100.0)
PLATELETS: 326 10*3/uL (ref 150–400)
RBC: 4.35 MIL/uL (ref 3.87–5.11)
RDW: 13 % (ref 11.5–15.5)
WBC: 5.3 10*3/uL (ref 4.0–10.5)

## 2014-07-17 LAB — RAPID URINE DRUG SCREEN, HOSP PERFORMED
AMPHETAMINES: NOT DETECTED
BARBITURATES: NOT DETECTED
Benzodiazepines: NOT DETECTED
Cocaine: NOT DETECTED
OPIATES: NOT DETECTED
TETRAHYDROCANNABINOL: NOT DETECTED

## 2014-07-17 LAB — LIPID PANEL
CHOL/HDL RATIO: 3 ratio
Cholesterol: 131 mg/dL (ref 0–200)
HDL: 44 mg/dL (ref >39)
LDL CALC: 65 mg/dL (ref 0–99)
TRIGLYCERIDES: 111 mg/dL (ref <150)
VLDL: 22 mg/dL (ref 0–40)

## 2014-07-17 LAB — ETHANOL: Alcohol, Ethyl (B): <5 mg/dL (ref 0–9)

## 2014-07-17 LAB — TROPONIN I

## 2014-07-17 LAB — HIV ANTIBODY (ROUTINE TESTING W REFLEX): HIV Screen 4th Generation wRfx: NONREACTIVE

## 2014-07-17 LAB — PROTIME-INR
INR: 0.99 (ref 0.00–1.49)
PROTHROMBIN TIME: 13.2 s (ref 11.6–15.2)

## 2014-07-17 LAB — GLUCOSE, CAPILLARY: Glucose-Capillary: 108 mg/dL — ABNORMAL HIGH (ref 70–99)

## 2014-07-17 LAB — TSH: TSH: 1.32 u[IU]/mL (ref 0.350–4.500)

## 2014-07-17 MED ORDER — MORPHINE SULFATE 2 MG/ML IJ SOLN
2.0000 mg | INTRAMUSCULAR | Status: DC | PRN
  Administered 2014-07-17: 2 mg via INTRAVENOUS
  Filled 2014-07-17: qty 1

## 2014-07-17 MED ORDER — ASPIRIN EC 81 MG PO TBEC
81.0000 mg | DELAYED_RELEASE_TABLET | Freq: Every day | ORAL | Status: DC
  Administered 2014-07-17: 81 mg via ORAL
  Filled 2014-07-17: qty 1

## 2014-07-17 MED ORDER — SODIUM CHLORIDE 0.9 % IV SOLN
INTRAVENOUS | Status: DC
  Administered 2014-07-17: 03:00:00 via INTRAVENOUS

## 2014-07-17 MED ORDER — TECHNETIUM TC 99M SESTAMIBI GENERIC - CARDIOLITE
30.0000 | Freq: Once | INTRAVENOUS | Status: AC | PRN
  Administered 2014-07-17: 30 via INTRAVENOUS

## 2014-07-17 MED ORDER — SODIUM CHLORIDE 0.9 % IJ SOLN
3.0000 mL | Freq: Two times a day (BID) | INTRAMUSCULAR | Status: DC
  Administered 2014-07-17: 3 mL via INTRAVENOUS

## 2014-07-17 MED ORDER — LORAZEPAM 2 MG/ML IJ SOLN: 0.5000 mg | Freq: Four times a day (QID) | INTRAMUSCULAR | Status: DC | PRN

## 2014-07-17 MED ORDER — HEPARIN SODIUM (PORCINE) 5000 UNIT/ML IJ SOLN
5000.0000 [IU] | Freq: Three times a day (TID) | INTRAMUSCULAR | Status: DC
  Administered 2014-07-17 (×2): 5000 [IU] via SUBCUTANEOUS
  Filled 2014-07-17 (×2): qty 1

## 2014-07-17 MED ORDER — ACETAMINOPHEN 650 MG RE SUPP: 650.0000 mg | Freq: Four times a day (QID) | RECTAL | Status: DC | PRN

## 2014-07-17 MED ORDER — ALUM & MAG HYDROXIDE-SIMETH 200-200-20 MG/5ML PO SUSP: 30.0000 mL | Freq: Four times a day (QID) | ORAL | Status: DC | PRN

## 2014-07-17 MED ORDER — TECHNETIUM TC 99M SESTAMIBI GENERIC - CARDIOLITE
10.0000 | Freq: Once | INTRAVENOUS | Status: AC | PRN
  Administered 2014-07-17: 10 via INTRAVENOUS

## 2014-07-17 MED ORDER — ATORVASTATIN CALCIUM 20 MG PO TABS: 20.0000 mg | ORAL_TABLET | Freq: Every day | ORAL | Status: DC

## 2014-07-17 MED ORDER — ASPIRIN 81 MG PO CHEW
324.0000 mg | CHEWABLE_TABLET | Freq: Once | ORAL | Status: AC
  Administered 2014-07-17: 324 mg via ORAL
  Filled 2014-07-17: qty 4

## 2014-07-17 MED ORDER — ACETAMINOPHEN 325 MG PO TABS
650.0000 mg | ORAL_TABLET | Freq: Four times a day (QID) | ORAL | Status: DC | PRN
  Administered 2014-07-17: 650 mg via ORAL
  Filled 2014-07-17: qty 2

## 2014-07-17 MED ORDER — NITROGLYCERIN 0.4 MG SL SUBL
0.4000 mg | SUBLINGUAL_TABLET | SUBLINGUAL | Status: DC | PRN
  Administered 2014-07-17 (×2): 0.4 mg via SUBLINGUAL
  Filled 2014-07-17: qty 1

## 2014-07-17 NOTE — Progress Notes (Signed)
Nuc study was negative for ischemia.  Pt is aware and going home.  We discussed improtance of exercise.

## 2014-07-17 NOTE — Progress Notes (Signed)
Exercise stress completed, Nuc results pending.

## 2014-07-17 NOTE — Progress Notes (Signed)
OT Cancellation Note  Patient Details Name: Kimberly Underwood MRN: [id number] DOB: 1965-04-08   Cancelled Treatment:    Reason Eval/Treat Not Completed: OT screened, no needs identified, will sign off. Thank you for the order.   Sacred Roa , MS, OTR/L, CLT Pager: [phone]  07/17/2014, 2:19 PM

## 2014-07-17 NOTE — Progress Notes (Signed)
PT Cancellation Note  Patient Details Name: Kimberly Underwood MRN: [id number] DOB: 08/26/1965   Cancelled Treatment:    Reason Eval/Treat Not Completed: PT screened, no needs identified, will sign off (Pt mobilizing without difficulty)   Jemar Paulsen 07/17/2014, 2:00 PM  [facility] [phone]

## 2014-07-17 NOTE — ED Provider Notes (Signed)
CSN: [id number]     Arrival date & time 07/16/14  2239 History   This chart was scribed for Dione Booze, MD by Murriel Hopper, ED Scribe. This patient was seen in room D33C/D33C and the patient's care was started at 12:12 AM.    Chief Complaint  Patient presents with  . Loss of Consciousness     The history is provided by the patient. No language interpreter was used.     HPI Comments: Kimberly Underwood is a 49 y.o. female who presents to the Emergency Department complaining of loss of consciousness that occurred 1.5 hours PTA with associated chest pain, dizziness, and right arm pain. Pt states that she had a sharp central chest pain that was 8/10 in severity at its worst. Pt states that she then became to feel dizzy and passed out. Pt states she was unconscious for 20 minutes or so. Pt notes that same chest pain is currently present. Pt states that nothing makes her pain better or worse. Pt also reports sharp right arm pain associated with LOC as she states she fell on her right arm when she passed out. Pt denies SOB, nausea, vomiting.     Past Medical History  Diagnosis Date  . CARPAL TUNNEL SYNDROME, BILATERAL 05/22/2010  . HYPERTENSION 10/25/2008  . Dental caries   . Arthritis     "little in both legs" (12/23/2012)  . Gout   . Chest pain     "just today; when I woke up" (12/23/2012)   Past Surgical History  Procedure Laterality Date  . Wisdom tooth extraction  ~ 1985  . Toenail excision Bilateral 2000's    "big toes" (12/23/2012)  . Abdominal hysterectomy  09/2010    fibroid, partial hysterectomy    Family History  Problem Relation Age of Onset  . Hypertension Father   . Gout Father   . Clotting disorder Father   . Stroke Father   . Lung disease Father     listeria  . Cancer Maternal Grandfather     lung cancer (smoking)  . Cancer Mother     breast cancer  . Migraines Sister   . Heart disease Daughter     heart murmur  . Diabetes Neg Hx    History  Substance Use Topics   . Smoking status: Never Smoker   . Smokeless tobacco: Never Used  . Alcohol Use: Yes     Comment: 12/23/2012 "mixed drink maybe on the weekend; not q weekend"   OB History    No data available     Review of Systems  Respiratory: Negative for shortness of breath.   Cardiovascular: Positive for chest pain.  Gastrointestinal: Negative for nausea and vomiting.  Neurological: Positive for dizziness and syncope.  All other systems reviewed and are negative.     Allergies  Review of patient's allergies indicates no known allergies.  Home Medications   Prior to Admission medications   Medication Sig Start Date End Date Taking? Authorizing Provider  ALPRAZolam (XANAX) 0.25 MG tablet Take 1 tablet (0.25 mg total) by mouth 2 (two) times daily as needed for sleep. Patient not taking: Reported on 07/07/2014 12/28/12   Kermit Balo Tysinger, PA-C  citalopram (CELEXA) 20 MG tablet 1/2 tablet po QHS x 1wk, then 1 tablet po QHS Patient not taking: Reported on 07/07/2014 12/28/12   Kermit Balo Tysinger, PA-C  lisinopril-hydrochlorothiazide (PRINZIDE,ZESTORETIC) 20-12.5 MG per tablet take 1 tablet by mouth once daily 12/30/13   Ronnald Nian, MD  naproxen (NAPROSYN) 375 MG tablet Take 1 tablet (375 mg total) by mouth 2 (two) times daily with a meal. 07/07/14   Kermit Balo Tysinger, PA-C  traMADol (ULTRAM) 50 MG tablet Take 1 tablet (50 mg total) by mouth every 6 (six) hours as needed. 07/07/14   Kermit Balo Tysinger, PA-C   BP 129/78 mmHg  Pulse 94  Temp(Src) 98.4 F (36.9 C) (Oral)  Resp 18  Ht  (1.702 m)  Wt 234 lb (106.142 kg)  BMI 36.64 kg/m2  SpO2 96% Physical Exam  Constitutional: She is oriented to person, place, and time. She appears well-developed and well-nourished.  HENT:  Head: Normocephalic and atraumatic.  Eyes: EOM are normal. Pupils are equal, round, and reactive to light.  Neck: Normal range of motion. Neck supple. No JVD present.  Cardiovascular: Normal rate, regular rhythm and normal  heart sounds.   No murmur heard. No carotid bruits   Pulmonary/Chest: Effort normal and breath sounds normal. She has no wheezes. She has no rales. She exhibits no tenderness.  Abdominal: Soft. Bowel sounds are normal. She exhibits no distension and no mass. There is no tenderness.  Musculoskeletal: Normal range of motion. She exhibits no edema.  Lymphadenopathy:    She has no cervical adenopathy.  Neurological: She is alert and oriented to person, place, and time. She has normal reflexes. No cranial nerve deficit. Coordination normal.  No motor or sensory deficits.  Skin: Skin is warm and dry. No rash noted.  Psychiatric: She has a normal mood and affect. Her behavior is normal. Judgment and thought content normal.  Nursing note and vitals reviewed.   ED Course  Procedures (including critical care time)  DIAGNOSTIC STUDIES: Oxygen Saturation is 96% on room air, normal by my interpretation.    COORDINATION OF CARE: 12:23 AM Discussed treatment plan with pt at bedside and pt agreed to plan.   Labs Review Results for orders placed or performed during the hospital encounter of 07/16/14  CBC  Result Value Ref Range   WBC 8.7 4.0 - 10.5 K/uL   RBC 4.52 3.87 - 5.11 MIL/uL   Hemoglobin 13.0 12.0 - 15.0 g/dL   HCT 95.6 21.3 - 08.6 %   MCV 85.0 78.0 - 100.0 fL   MCH 28.8 26.0 - 34.0 pg   MCHC 33.9 30.0 - 36.0 g/dL   RDW 57.8 46.9 - 62.9 %   Platelets 355 150 - 400 K/uL  Basic metabolic panel  Result Value Ref Range   Sodium 135 135 - 145 mmol/L   Potassium 3.5 3.5 - 5.1 mmol/L   Chloride 103 96 - 112 mmol/L   CO2 24 19 - 32 mmol/L   Glucose, Bld 123 (H) 70 - 99 mg/dL   BUN 13 6 - 23 mg/dL   Creatinine, Ser 5.28 0.50 - 1.10 mg/dL   Calcium 9.2 8.4 - 41.3 mg/dL   GFR calc non Af Amer 85 (L) >90 mL/min   GFR calc Af Amer >90 >90 mL/min   Anion gap 8 5 - 15  I-stat troponin, ED (not at [facility])  Result Value Ref Range   Troponin i, poc 0.00 0.00 - 0.08 ng/mL   Comment 3            Imaging Review Dg Chest 2 View  07/17/2014   CLINICAL DATA:  Loss of consciousness.  Chest tightness.  EXAM: CHEST  2 VIEW  COMPARISON:  12/23/2012  FINDINGS: The cardiomediastinal contours are normal. The lungs are clear. Pulmonary  vasculature is normal. No consolidation, pleural effusion, or pneumothorax. No acute osseous abnormalities are seen.  IMPRESSION: No acute pulmonary process.   Electronically Signed   By: Rubye OaksMelanie  Ehinger M.D.   On: 07/17/2014 00:31     EKG Interpretation   Date/Time:  Sunday July 16 2014 22:44:35 EDT Ventricular Rate:  95 PR Interval:  158 QRS Duration: 74 QT Interval:  360 QTC Calculation: 452 R Axis:   83 Text Interpretation:  Sinus rhythm with Premature supraventricular  complexes Right atrial enlargement Borderline ECG When compared with ECG  of 12/23/2012, Premature ventricular complexes are now Present Confirmed by  [facility]  MD, Shayne Deerman ([id number]) on 07/17/2014 12:09:44 AM      MDM   Final diagnoses:  Syncope  Chest pain, unspecified chest pain type    Chest pain and syncope. She is given a dose of aspirin and is given nitroglycerin with complete resolution of chest pain. Cause of syncope is clear but prolonged loss of consciousness is worrisome. Case is discussed with Dr. Clyde LundborgNiu of [facility], who agrees to admit the patient. CT of teh head has been ordered, and is pending.  I personally performed the services described in this documentation, which was scribed in my presence. The recorded information has been reviewed and is accurate.      Dione Boozeavid Reesa Gotschall, MD 07/17/14 [phone]

## 2014-07-17 NOTE — Progress Notes (Signed)
Utilization review completed. Kyshon Tolliver, RN, BSN. 

## 2014-07-17 NOTE — H&P (Signed)
[facility] History and Physical  Kimberly GALENO UJW:[id number] DOB: 1966/01/04 DOA: 07/16/2014  Referring physician: ED physician PCP: Carollee Herter, MD  Specialists:   Chief Complaint: syncope and chest pain  HPI: Kimberly Underwood is a 49 y.o. female with past medical history of hypertension, gout, depression, carpal tunnel syndrome, osteoarthritis, history of chest pain, who presents with syncope and chest pain.  Patient reports that she passed out after she used bathroom for urinatiion and stood up at about 9:30 PM. She reports that she passed out for almost 20 minutes? She denies any seizure, injury, palpitation, unilateral weakness. She states that she had numbness in the right arm which is chronic due to carpal tunnel syndrome. Currently she does not have any numbness.  Patient also reports chest pain. It is located in substernal area, 4/10 in severity, constant, nonradiating. It is not associated with shortness of breath, palpitation, diaphoresis. It is not pleuritic. It is not aggravated by deep breath. Her chest pain has subsided after taking nitroglycerin in emergency room. No calf tenderness bilaterally.  Patient denies fever, chills, fatigue, cough, abdominal pain, diarrhea, constipation, dysuria, urgency, frequency, hematuria, skin rashes or leg swelling. Currently no unilateral weakness. No vision change or hearing loss.  In ED, patient was found to have negative CT head for acute abnormalities, negative chest x-ray, negative troponin, temperature normal, no tachycardia, electrolytes okay. EKG showed PVC, no ischemic change. Patient is admitted to inpatient for further evaluation and treatment.  Review of Systems: As presented in the history of presenting illness, rest negative.  Where does patient live? At home  Can patient participate in ADLs? Yes  Allergy: No Known Allergies  Past Medical History  Diagnosis Date  . CARPAL TUNNEL SYNDROME, BILATERAL 05/22/2010   . HYPERTENSION 10/25/2008  . Dental caries   . Arthritis     "little in both legs" (12/23/2012)  . Gout   . Chest pain     "just today; when I woke up" (12/23/2012)    Past Surgical History  Procedure Laterality Date  . Wisdom tooth extraction  ~ 1985  . Toenail excision Bilateral 2000's    "big toes" (12/23/2012)  . Abdominal hysterectomy  09/2010    fibroid, partial hysterectomy     Social History:  reports that she has never smoked. She has never used smokeless tobacco. She reports that she does not drink alcohol or use illicit drugs.  Family History:  Family History  Problem Relation Age of Onset  . Hypertension Father   . Gout Father   . Clotting disorder Father   . Stroke Father   . Lung disease Father     listeria  . Cancer Maternal Grandfather     lung cancer (smoking)  . Cancer Mother     breast cancer  . Migraines Sister   . Heart disease Daughter     heart murmur  . Diabetes Neg Hx      Prior to Admission medications   Medication Sig Start Date End Date Taking? Authorizing Provider  ALPRAZolam (XANAX) 0.25 MG tablet Take 1 tablet (0.25 mg total) by mouth 2 (two) times daily as needed for sleep. Patient not taking: Reported on 07/07/2014 12/28/12   Kermit Balo Tysinger, PA-C  citalopram (CELEXA) 20 MG tablet 1/2 tablet po QHS x 1wk, then 1 tablet po QHS Patient not taking: Reported on 07/07/2014 12/28/12   Kermit Balo Tysinger, PA-C  lisinopril-hydrochlorothiazide (PRINZIDE,ZESTORETIC) 20-12.5 MG per tablet take 1 tablet by mouth once daily 12/30/13  Ronnald NianJohn C Lalonde, MD  naproxen (NAPROSYN) 375 MG tablet Take 1 tablet (375 mg total) by mouth 2 (two) times daily with a meal. 07/07/14   Kermit Baloavid S Tysinger, PA-C  traMADol (ULTRAM) 50 MG tablet Take 1 tablet (50 mg total) by mouth every 6 (six) hours as needed. 07/07/14   Jac Canavanavid S Tysinger, PA-C    Physical Exam: Ceasar MonsFiled Vitals:   07/17/14 0018 07/17/14 0033 07/17/14 0217 07/17/14 0541  BP:  116/73 117/74 106/59  Pulse: 77 77 76 77   Temp:   98.3 F (36.8 C) 98.5 F (36.9 C)  TempSrc:   Oral Oral  Resp: 11 18 20 20   Height:   5\' 7"  (1.702 m)   Weight:   108.5 kg (239 lb 3.2 oz) 108.5 kg (239 lb 3.2 oz)  SpO2: 96% 99% 99% 98%   General: Not in acute distress HEENT:       Eyes: PERRL, EOMI, no scleral icterus       ENT: No discharge from the ears and nose, no pharynx injection, no tonsillar enlargement.        Neck: No JVD, no bruit, no mass felt. Cardiac: S1/S2, RRR, No murmurs, No gallops or rubs Pulm: Good air movement bilaterally. Clear to auscultation bilaterally. No rales, wheezing, rhonchi or rubs. Abd: Soft, nondistended, nontender, no rebound pain, no organomegaly, BS present Ext: No edema bilaterally. 2+DP/PT pulse bilaterally Musculoskeletal: No joint deformities, erythema, or stiffness, ROM full Skin: No rashes.  Neuro: Alert and oriented X3, cranial nerves II-XII grossly intact, muscle strength 5/5 in all extremeties, sensation to light touch intact. Brachial reflex 2+ bilaterally. Knee reflex 1+ bilaterally. Negative Babinski's sign. Normal finger to nose test. Psych: Patient is not psychotic, no suicidal or hemocidal ideation.  Labs on Admission:  Basic Metabolic Panel:  Recent Labs Lab 07/16/14 2253  NA 135  K 3.5  CL 103  CO2 24  GLUCOSE 123*  BUN 13  CREATININE 0.80  CALCIUM 9.2   Liver Function Tests: No results for input(s): AST, ALT, ALKPHOS, BILITOT, PROT, ALBUMIN in the last 168 hours. No results for input(s): LIPASE, AMYLASE in the last 168 hours. No results for input(s): AMMONIA in the last 168 hours. CBC:  Recent Labs Lab 07/16/14 2253  WBC 8.7  HGB 13.0  HCT 38.4  MCV 85.0  PLT 355   Cardiac Enzymes:  Recent Labs Lab 07/17/14 0250  TROPONINI <0.03    BNP (last 3 results) No results for input(s): BNP in the last 8760 hours.  ProBNP (last 3 results) No results for input(s): PROBNP in the last 8760 hours.  CBG: No results for input(s): GLUCAP in the last  168 hours.  Radiological Exams on Admission: Dg Chest 2 View  07/17/2014   CLINICAL DATA:  Loss of consciousness.  Chest tightness.  EXAM: CHEST  2 VIEW  COMPARISON:  12/23/2012  FINDINGS: The cardiomediastinal contours are normal. The lungs are clear. Pulmonary vasculature is normal. No consolidation, pleural effusion, or pneumothorax. No acute osseous abnormalities are seen.  IMPRESSION: No acute pulmonary process.   Electronically Signed   By: Rubye OaksMelanie  Ehinger M.D.   On: 07/17/2014 00:31   Ct Head Wo Contrast  07/17/2014   CLINICAL DATA:  Syncope and dizziness  EXAM: CT HEAD WITHOUT CONTRAST  TECHNIQUE: Contiguous axial images were obtained from the base of the skull through the vertex without intravenous contrast.  COMPARISON:  None.  FINDINGS: Skull and Sinuses:Negative for fracture or destructive process. The mastoids, middle ears,  and imaged paranasal sinuses are clear.  Orbits: No acute abnormality.  Brain: No evidence of acute infarction, hemorrhage, hydrocephalus, or mass lesion/mass effect.  IMPRESSION: Negative head CT.   Electronically Signed   By: Marnee Spring M.D.   On: 07/17/2014 01:59    EKG: Independently reviewed. Has PVC, no ischemia  Assessment/Plan Principal Problem:   Syncope Active Problems:   CARPAL TUNNEL SYNDROME, BILATERAL   Essential hypertension, benign   Obesity (BMI 30-39.9)   Arthritis   Chest pain   Syncope: Etiology is not clear. It is likely due to orthostatic status or vasovagal syncope. Her syncope happened after she urinated, is consistent with vasovagal or situational syncope. CT-head is negative for acute abnormalities. An important differential diagnosis is stroke, but patient does not have any weakness. She had right arm numbness, which she attributed to the carpal tunnel syndrome. Currently she does not have any numbness, weakness or tingling sensations in any of her extremities. I think it is less likely for her to have stroke -will admit to  tele bed -check orthostatic vital signs -pt/ot -IV: 100 cc/h NS  Chest pain: this is atypical chest pain. She has very low risk for ACS. No signs of DVT, with low Well's score for PE.  CXR is negative for pneumonia. Will admit for chest pain rule out. - cycle CE q6 x3 and repeat her EKG in the am  - Nitroglycerin, Morphine, and aspirin, lipitor  - Risk factor stratification: will check FLP and A1C  - Consider cardiology consult if test positive for CEs  - 2d echo - Mylanta for possible acid reflux  HTN:  -prinzide   DVT ppx: SQ Heparin   Code Status: Full code Family Communication:  Yes, patient's  mother   at bed side Disposition Plan: Admit to inpatient   Date of Service 07/17/2014    Lorretta Harp [facility] Pager [phone]  If 7PM-7AM, please contact night-coverage [email or web address] Password [facility] 07/17/2014, 6:15 AM

## 2014-07-17 NOTE — Discharge Summary (Signed)
Physician Discharge Summary  Kimberly Underwood [name] EAV:[id number] DOB: [birth year] DOA: 07/16/2014  PCP: Carollee HerterLALONDE,JOHN CHARLES, MD  Admit date: 07/16/2014 Discharge date: 07/17/2014  Time spent: >35 minutes  Recommendations for Outpatient Follow-up:  F/u with PCP in 3-7 days as needed Discharge Diagnoses:  Principal Problem:   Syncope Active Problems:   CARPAL TUNNEL SYNDROME, BILATERAL   Essential hypertension, benign   Obesity (BMI 30-39.9)   Arthritis   Chest pain   Discharge Condition: stable;   Diet recommendation: low sodium   Filed Weights   07/16/14 2244 07/17/14 0217 07/17/14 0541  Weight: 106.142 kg (234 lb) 108.5 kg (239 lb 3.2 oz) 108.5 kg (239 lb 3.2 oz)    History of present illness:  49 y.o. female with past medical history of hypertension, gout, depression, carpal tunnel syndrome, osteoarthritis, history of chest pain, who presents with syncope and chest pain.   Hospital Course:  1. Syncope thought possible vasovagal on top of BP meds; Her syncope happened after she urinated, is consistent with vasovagal or situational syncope. CT-head is negative for acute abnormalities.  -no new symptoms in the hospital; neuro exam is non focal; stress test is negative; holding BP meds at discharge   2. Chest pain- resolved; stress test: negative; pend echo  3. HTN. BP is stable off meds; recommended to cont diet, exercise for BP control;  -will hold meds at discharge due to syncope; recommended to f/u with PCP in 3-7 days to reevaluate for mediations treatment as needed   Procedures:  Echo pend;  Stress test: Low risk stress nuclear study with no chest pain and no electrocardiographic changes. The scintigraphic results show probable breast attenuation but no ischemia. The gated ejection fraction was 63% and the wall motion was normal.  Olga MillersBrian Crenshaw     (i.e. Studies not automatically included, echos, thoracentesis, etc; not x-rays)  Consultations:  Cardiology    Discharge Exam: Filed Vitals:   07/17/14 1139  BP: 125/53  Pulse: 102  Temp:   Resp:     General: alert Cardiovascular: s1,s2 rrr Respiratory: CTA bl  Discharge Instructions  Discharge Instructions    Diet - low sodium heart healthy    Complete by:  As directed      Increase activity slowly    Complete by:  As directed             Medication List    STOP taking these medications        ALPRAZolam 0.25 MG tablet  Commonly known as:  XANAX     lisinopril-hydrochlorothiazide 20-12.5 MG per tablet  Commonly known as:  PRINZIDE,ZESTORETIC     naproxen 375 MG tablet  Commonly known as:  NAPROSYN     traMADol 50 MG tablet  Commonly known as:  ULTRAM      TAKE these medications        citalopram 20 MG tablet  Commonly known as:  CELEXA  1/2 tablet po QHS x 1wk, then 1 tablet po QHS       No Known Allergies     Follow-up Information    Follow up with Carollee HerterLALONDE,JOHN CHARLES, MD In 1 week.   Specialty:  Family Medicine   Contact information:   [address] [place] [address] [phone]       Follow up with CROITORU,MIHAI, MD In 3 days.   Specialty:  Cardiology   Contact information:   [address] [place] [address] [phone]  The results of significant diagnostics from this hospitalization (including imaging, microbiology, ancillary and laboratory) are listed below for reference.    Significant Diagnostic Studies: Dg Chest 2 View  07/17/2014   CLINICAL DATA:  Loss of consciousness.  Chest tightness.  EXAM: CHEST  2 VIEW  COMPARISON:  12/23/2012  FINDINGS: The cardiomediastinal contours are normal. The lungs are clear. Pulmonary vasculature is normal. No consolidation, pleural effusion, or pneumothorax. No acute osseous abnormalities are seen.  IMPRESSION: No acute pulmonary process.   Electronically Signed   By: Rubye Oaks M.D.   On: 07/17/2014 00:31   Ct Head Wo Contrast  07/17/2014   CLINICAL DATA:   Syncope and dizziness  EXAM: CT HEAD WITHOUT CONTRAST  TECHNIQUE: Contiguous axial images were obtained from the base of the skull through the vertex without intravenous contrast.  COMPARISON:  None.  FINDINGS: Skull and Sinuses:Negative for fracture or destructive process. The mastoids, middle ears, and imaged paranasal sinuses are clear.  Orbits: No acute abnormality.  Brain: No evidence of acute infarction, hemorrhage, hydrocephalus, or mass lesion/mass effect.  IMPRESSION: Negative head CT.   Electronically Signed   By: Marnee Spring M.D.   On: 07/17/2014 01:59   Nm Myocar Multi W/spect W/wall Motion / Ef  07/17/2014   CLINICAL DATA:  Chest pain  EXAM: Lexiscan Myovue  TECHNIQUE: The patient received IV Lexiscan .  over 15 seconds. 33.0 mCi of Technetium 86m Sestamibi injected at 30 seconds. Quantitative SPECT images were obtained in the vertical, horizontal and short axis planes after a 45 minute delay. Rest images were obtained with similar planes and delay using 10.2 mCi of Technetium 14m Sestamibi.  FINDINGS: ECG: Baseline ECG shows NSR with no ST changes; Patient exercised for 7:01 on the Bruce protocol; 7 mets; HR increased from 78 to 157 (91% predicted); BP increased from 110/73 to 142/65; terminated due to [facility] achieved. No ST changes with exercise.  Symptoms:  No chest pain.  RAW Data:  Adequate acquistion.  Quantitiative Gated SPECT EF: 63; normal wall motion; TID-0.97; EDV-77 ml; ESV-29 ml.  Perfusion Images: The images were reconstructed in the short axis as well as the vertical and horizontal long axis. The stress images reveal a small mild defect in the anterior wall. When compared to the rest images there is no significant reversibility. This is felt to be breast attenuation but no ischemia.  IMPRESSION: Low risk stress nuclear study with no chest pain and no electrocardiographic changes. The scintigraphic results show probable breast attenuation but no ischemia. The gated ejection fraction  was 63% and the wall motion was normal.  Olga Millers   Electronically Signed   By: Olga Millers   On: 07/17/2014 14:49    Microbiology: No results found for this or any previous visit (from the past 240 hour(s)).   Labs: Basic Metabolic Panel:  Recent Labs Lab 07/16/14 2253 07/17/14 0850  NA 135 139  K 3.5 3.6  CL 103 108  CO2 24 24  GLUCOSE 123* 98  BUN 13 9  CREATININE 0.80 0.77  CALCIUM 9.2 8.7   Liver Function Tests:  Recent Labs Lab 07/17/14 0850  AST 14  ALT 11  ALKPHOS 63  BILITOT 0.6  PROT 6.4  ALBUMIN 3.3*   No results for input(s): LIPASE, AMYLASE in the last 168 hours. No results for input(s): AMMONIA in the last 168 hours. CBC:  Recent Labs Lab 07/16/14 2253 07/17/14 0850  WBC 8.7 5.3  HGB 13.0 12.4  HCT 38.4  37.4  MCV 85.0 86.0  PLT 355 326   Cardiac Enzymes:  Recent Labs Lab 07/17/14 0250 07/17/14 0850  TROPONINI <0.03 <0.03   BNP: BNP (last 3 results) No results for input(s): BNP in the last 8760 hours.  ProBNP (last 3 results) No results for input(s): PROBNP in the last 8760 hours.  CBG:  Recent Labs Lab 07/17/14 0648  GLUCAP 108*       Signed:  Jonette Mate N  [facility] 07/17/2014, 3:43 PM

## 2014-07-17 NOTE — Consult Note (Signed)
Reason for Consult: Syncope, chest pain  Requesting Physician: York Spaniel  Cardiologist: None  HPI: This is a 49 y.o. female with a past medical history significant for treated HTN and the absence of other vascular risk factors, known heart disease or prior VTE presents after first-ever syncopal event.  She has had previous "dizzy" spells when she gets up quickly or rushes, but has never lost consciousness. No recent health problems or travel.  Syncope happened immediately after getting up from commode following micturition. She felt dizzy and had brief chest discomfort. Lost consciousness and hit her arm against bathtub, without serious injury.  No serious arrhythmia on the monitor overnight. Notable for mild sinus tachycardia (110-120s) every time she gets up.  Norma ECG and cardiac enzymes.  PMHx:  Past Medical History  Diagnosis Date  . CARPAL TUNNEL SYNDROME, BILATERAL 05/22/2010  . HYPERTENSION 10/25/2008  . Dental caries   . Arthritis     "little in both legs" (12/23/2012)  . Gout   . Chest pain     "just today; when I woke up" (12/23/2012)   Past Surgical History  Procedure Laterality Date  . Wisdom tooth extraction  ~ 1985  . Toenail excision Bilateral 2000's    "big toes" (12/23/2012)  . Abdominal hysterectomy  09/2010    fibroid, partial hysterectomy     FAMHx: Family History  Problem Relation Age of Onset  . Hypertension Father   . Gout Father   . Clotting disorder Father   . Stroke Father   . Lung disease Father     listeria  . Cancer Maternal Grandfather     lung cancer (smoking)  . Cancer Mother     breast cancer  . Migraines Sister   . Heart disease Daughter     heart murmur  . Diabetes Neg Hx     SOCHx:  reports that she has never smoked. She has never used smokeless tobacco. She reports that she does not drink alcohol or use illicit drugs.  ALLERGIES: No Known Allergies  ROS: The patient specifically denies any chest pain  with  exertion, dyspnea at rest or with exertion, orthopnea, paroxysmal nocturnal dyspnea, palpitations, focal neurological deficits, intermittent claudication, lower extremity edema, unexplained weight gain, cough, hemoptysis or wheezing.  The patient also denies abdominal pain, nausea, vomiting, dysphagia, diarrhea, constipation, polyuria, polydipsia, dysuria, hematuria, frequency, urgency, abnormal bleeding or bruising, fever, chills, unexpected weight changes, mood swings, change in skin or hair texture, change in voice quality, auditory or visual problems, allergic reactions or rashes, new musculoskeletal complaints other than usual "aches and pains".   HOME MEDICATIONS: Prescriptions prior to admission  Medication Sig Dispense Refill Last Dose  . lisinopril-hydrochlorothiazide (PRINZIDE,ZESTORETIC) 20-12.5 MG per tablet take 1 tablet by mouth once daily 30 tablet 11 07/16/2014 at Unknown time  . ALPRAZolam (XANAX) 0.25 MG tablet Take 1 tablet (0.25 mg total) by mouth 2 (two) times daily as needed for sleep. (Patient not taking: Reported on 07/07/2014) 20 tablet 0 Not Taking at Unknown time  . citalopram (CELEXA) 20 MG tablet 1/2 tablet po QHS x 1wk, then 1 tablet po QHS (Patient not taking: Reported on 07/07/2014) 30 tablet 1 Not Taking at Unknown time  . naproxen (NAPROSYN) 375 MG tablet Take 1 tablet (375 mg total) by mouth 2 (two) times daily with a meal. (Patient not taking: Reported on 07/17/2014) 60 tablet 0 Not Taking at Unknown time  . traMADol (ULTRAM) 50 MG tablet Take 1 tablet (50  mg total) by mouth every 6 (six) hours as needed. (Patient not taking: Reported on 07/17/2014) 20 tablet 0 Not Taking at Unknown time    HOSPITAL MEDICATIONS: Prior to Admission:  Prescriptions prior to admission  Medication Sig Dispense Refill Last Dose  . lisinopril-hydrochlorothiazide (PRINZIDE,ZESTORETIC) 20-12.5 MG per tablet take 1 tablet by mouth once daily 30 tablet 11 07/16/2014 at Unknown time  . ALPRAZolam  (XANAX) 0.25 MG tablet Take 1 tablet (0.25 mg total) by mouth 2 (two) times daily as needed for sleep. (Patient not taking: Reported on 07/07/2014) 20 tablet 0 Not Taking at Unknown time  . citalopram (CELEXA) 20 MG tablet 1/2 tablet po QHS x 1wk, then 1 tablet po QHS (Patient not taking: Reported on 07/07/2014) 30 tablet 1 Not Taking at Unknown time  . naproxen (NAPROSYN) 375 MG tablet Take 1 tablet (375 mg total) by mouth 2 (two) times daily with a meal. (Patient not taking: Reported on 07/17/2014) 60 tablet 0 Not Taking at Unknown time  . traMADol (ULTRAM) 50 MG tablet Take 1 tablet (50 mg total) by mouth every 6 (six) hours as needed. (Patient not taking: Reported on 07/17/2014) 20 tablet 0 Not Taking at Unknown time    VITALS: Blood pressure 117/73, pulse 74, temperature 98.7 F (37.1 C), temperature source Oral, resp. rate 18, height 5\' 7"  (1.702 m), weight 239 lb 3.2 oz (108.5 kg), SpO2 98 %.  PHYSICAL EXAM:  General: Alert, oriented x3, no distress Head: no evidence of trauma, PERRL, EOMI, no exophtalmos or lid lag, no myxedema, no xanthelasma; normal ears, nose and oropharynx Neck: normal jugular venous pulsations and no hepatojugular reflux; brisk carotid pulses without delay and no carotid bruits Chest: clear to auscultation, no signs of consolidation by percussion or palpation, normal fremitus, symmetrical and full respiratory excursions Cardiovascular: normal position and quality of the apical impulse, regular rhythm, normal first heart sound and normal second heart sound, no rubs or gallops, no murmur Abdomen: no tenderness or distention, no masses by palpation, no abnormal pulsatility or arterial bruits, normal bowel sounds, no hepatosplenomegaly Extremities: no clubbing, cyanosis;  no edema; 2+ radial, ulnar and brachial pulses bilaterally; 2+ right femoral, posterior tibial and dorsalis pedis pulses; 2+ left femoral, posterior tibial and dorsalis pedis pulses; no subclavian or femoral  bruits Neurological: grossly nonfocal   LABS  CBC  Recent Labs  07/16/14 2253  WBC 8.7  HGB 13.0  HCT 38.4  MCV 85.0  PLT 355   Basic Metabolic Panel  Recent Labs  07/16/14 2253  NA 135  K 3.5  CL 103  CO2 24  GLUCOSE 123*  BUN 13  CREATININE 0.80  CALCIUM 9.2   Liver Function Tests No results for input(s): AST, ALT, ALKPHOS, BILITOT, PROT, ALBUMIN in the last 72 hours. No results for input(s): LIPASE, AMYLASE in the last 72 hours. Cardiac Enzymes  Recent Labs  07/17/14 0250  TROPONINI <0.03   BNP Invalid input(s): POCBNP D-Dimer No results for input(s): DDIMER in the last 72 hours. Hemoglobin A1C No results for input(s): HGBA1C in the last 72 hours. Fasting Lipid Panel  Recent Labs  07/17/14 0250  CHOL 131  HDL 44  LDLCALC 65  TRIG 111  CHOLHDL 3.0   Thyroid Function Tests  Recent Labs  07/17/14 0250  TSH 1.320      IMAGING: Dg Chest 2 View  07/17/2014   CLINICAL DATA:  Loss of consciousness.  Chest tightness.  EXAM: CHEST  2 VIEW  COMPARISON:  12/23/2012  FINDINGS: The cardiomediastinal contours are normal. The lungs are clear. Pulmonary vasculature is normal. No consolidation, pleural effusion, or pneumothorax. No acute osseous abnormalities are seen.  IMPRESSION: No acute pulmonary process.   Electronically Signed   By: Rubye Oaks M.D.   On: 07/17/2014 00:31   Ct Head Wo Contrast  07/17/2014   CLINICAL DATA:  Syncope and dizziness  EXAM: CT HEAD WITHOUT CONTRAST  TECHNIQUE: Contiguous axial images were obtained from the base of the skull through the vertex without intravenous contrast.  COMPARISON:  None.  FINDINGS: Skull and Sinuses:Negative for fracture or destructive process. The mastoids, middle ears, and imaged paranasal sinuses are clear.  Orbits: No acute abnormality.  Brain: No evidence of acute infarction, hemorrhage, hydrocephalus, or mass lesion/mass effect.  IMPRESSION: Negative head CT.   Electronically Signed   By:  Marnee Spring M.D.   On: 07/17/2014 01:59    ECG: Normal  TELEMETRY: Brief sinus tachycardia with movement  IMPRESSION/RECOMMENDATION: 1. Syncope, probably vasovagal, but without prior events by age 38. Check echo for structural heart disease. Outpatient 30 day monitor. 2. Atypical chest pain, unlikely to be coronary in etiology. Suggest treadmill ECG test. 3. Some of the features suggest orthostatic hypotension. Recommend stopping HCTZ, leave on lisinopril. If necessary for BP, add beta blocker or amlodipine.  Thurmon Fair, MD, [facility] [phone] office [phone] pager   07/17/2014, 8:35 AM

## 2014-07-17 NOTE — Telephone Encounter (Signed)
LMOM TO CB. CLS 

## 2014-07-17 NOTE — Progress Notes (Signed)
  Echocardiogram 2D Echocardiogram has been performed.  Leta JunglingCooper, Zaina Jenkin M 07/17/2014, 2:34 PM

## 2014-07-17 NOTE — Telephone Encounter (Signed)
Patient states that she is in the hospital. She has been there since 1000 yesterday. She is aware of your message and she will follow up once she is released from the hospital.

## 2014-07-18 LAB — HEMOGLOBIN A1C
HEMOGLOBIN A1C: 6 % — AB (ref 4.8–5.6)
Mean Plasma Glucose: 126 mg/dL

## 2014-07-24 ENCOUNTER — Ambulatory Visit (INDEPENDENT_AMBULATORY_CARE_PROVIDER_SITE_OTHER): Payer: BLUE CROSS/BLUE SHIELD

## 2014-07-24 ENCOUNTER — Encounter: Payer: Self-pay | Admitting: Podiatry

## 2014-07-24 ENCOUNTER — Ambulatory Visit (INDEPENDENT_AMBULATORY_CARE_PROVIDER_SITE_OTHER): Payer: BLUE CROSS/BLUE SHIELD | Admitting: Podiatry

## 2014-07-24 VITALS — BP 113/69 | HR 80 | Resp 14

## 2014-07-24 DIAGNOSIS — M779 Enthesopathy, unspecified: Secondary | ICD-10-CM | POA: Diagnosis not present

## 2014-07-24 DIAGNOSIS — L600 Ingrowing nail: Secondary | ICD-10-CM | POA: Diagnosis not present

## 2014-07-24 DIAGNOSIS — M79673 Pain in unspecified foot: Secondary | ICD-10-CM

## 2014-07-24 NOTE — Patient Instructions (Signed)

## 2014-07-24 NOTE — Progress Notes (Signed)
   Subjective:    Patient ID: Kimberly Underwood, female    DOB: 1965/09/16, 49 y.o.   MRN: [id number]  HPI I want both 3rd toe nails removed and I have pain in the ball of my foot when I stand for long periods.    Review of Systems  Cardiovascular: Positive for leg swelling.  Neurological: Positive for tremors.       Objective:   Physical Exam        Assessment & Plan:

## 2014-07-25 NOTE — Progress Notes (Signed)
Subjective:     Patient ID: Kimberly Underwood, female   DOB: [birth year], 49 y.o.   MRN: [id number]  HPI patient presents stating that my third nails on both feet are really thick and painful and I cannot cut them anymore and I like them removed like my big nails in the bottom of my feet bother me a lot   Review of Systems  All other systems reviewed and are negative.      Objective:   Physical Exam  Constitutional: She is oriented to person, place, and time.  Cardiovascular: Intact distal pulses.   Musculoskeletal: Normal range of motion.  Neurological: She is oriented to person, place, and time.  Skin: Skin is warm.  Nursing note and vitals reviewed.  her vascular status intact with muscle strength adequate and range of motion subtalar midtarsal joint within normal limits. Patient's noted to have good digital perfusion and is well oriented 3 with thick damaged third nails of both feet that are painful and pain in the plantar aspect of both feet along the second third and fourth metatarsophalangeal joints     Assessment:     Damaged third nails bilateral with pain and inflammatory capsulitis with metatarsalgia bilateral    Plan:     H&P and x-rays reviewed with patient. We will look at orthotics at next visit but first were to focus on the nails and I explained removal procedure and risk associated with it. Patient wants surgery understanding risk and today I infiltrated each third toe with 60 mg Xylocaine Marcaine mixture removed the third nails exposed matrix and applied phenol 3 applications 30 seconds followed by alcohol lavaged and sterile dressing. Gave instructions on soaks and reappoint in 10 days

## 2014-08-02 ENCOUNTER — Inpatient Hospital Stay: Payer: BLUE CROSS/BLUE SHIELD | Admitting: Medical

## 2014-08-08 ENCOUNTER — Ambulatory Visit (INDEPENDENT_AMBULATORY_CARE_PROVIDER_SITE_OTHER): Payer: BLUE CROSS/BLUE SHIELD | Admitting: Medical

## 2014-08-08 ENCOUNTER — Telehealth: Payer: Self-pay | Admitting: Medical

## 2014-08-08 ENCOUNTER — Encounter: Payer: Self-pay | Admitting: Medical

## 2014-08-08 VITALS — BP 112/78 | HR 88 | Temp 98.3°F | Resp 15 | Wt 242.0 lb

## 2014-08-08 DIAGNOSIS — I10 Essential (primary) hypertension: Secondary | ICD-10-CM

## 2014-08-08 DIAGNOSIS — M25511 Pain in right shoulder: Secondary | ICD-10-CM | POA: Diagnosis not present

## 2014-08-08 DIAGNOSIS — G8929 Other chronic pain: Secondary | ICD-10-CM | POA: Diagnosis not present

## 2014-08-08 DIAGNOSIS — M7521 Bicipital tendinitis, right shoulder: Secondary | ICD-10-CM | POA: Diagnosis not present

## 2014-08-08 DIAGNOSIS — R55 Syncope and collapse: Secondary | ICD-10-CM | POA: Diagnosis not present

## 2014-08-08 DIAGNOSIS — F419 Anxiety disorder, unspecified: Secondary | ICD-10-CM

## 2014-08-08 DIAGNOSIS — R0789 Other chest pain: Secondary | ICD-10-CM

## 2014-08-08 NOTE — Telephone Encounter (Signed)
Refer to ortho for right shoulder pains for months.  Make sure it is a shoulder orthopod

## 2014-08-08 NOTE — Progress Notes (Signed)
Subjective: Here with boyfriend for hospital visit f/u and ongoing right shoulder pains.    Was hospitalized for syncope 07/16/14 - 07/17/14.  Cardiac work up and labs done, and cause determined to be vasovagal getting up from toilet.  There was suggestion of backing off her BP medication dose.   She is still taking her same BP medication without c/o, no additional syncope since the hospitalization . She does note poor water intake and sometimes skips meals, no chest pain, SOB, edema, or palpations.  No paresthesias.    Here for ongoing right shoulder pains.  I have seen her in the past for similar back in 2014.  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   Gets soreness of right shoulder.   Lately really been aggravated within the shoulder and she is doing a different job than her normal.  Despite arm sling, rest, NSAIDs, ice and other recommendations from last visit, she still has the same pain   Past Medical History  Diagnosis Date  . CARPAL TUNNEL SYNDROME, BILATERAL 05/22/2010  . HYPERTENSION 10/25/2008  . Dental caries   . Arthritis     "little in both legs" (12/23/2012)  . Gout   . Chest pain     "just today; when I woke up" (12/23/2012)   ROS as in subjective  Objective: BP 112/78 mmHg  Pulse 88  Temp(Src) 98.3 F (36.8 C) (Oral)  Resp 15  Wt 242 lb (109.77 kg)  General appearance: alert, no distress, WD/WN Neck: supple, no lymphadenopathy, no thyromegaly, no masses Heart: RRR, normal S1, S2, no murmurs Lungs: CTA bilaterally, no wheezes, rhonchi, or rales MSK: Tender over right biceps tendon origin, pain in the same area with shoulder and arm range of motion over 90, decreased internal and external Rotation of the right arm due to same pain, otherwise upper extremity exam unremarkable.  Legs nontender, no deformity normal range of motion No obvious swelling of either extremity Arms  and legs neurovascularly intact Back: nontender  Assessment: Encounter Diagnoses  Name Primary?  . Chronic right shoulder pain Yes  . Biceps tendonitis, right   . Vasovagal syncope   . Essential hypertension     Plan: chronic right shoulder pain and biceps tendonitis - not improving with conservative measures.   We have seen her several times here for the same.  Referral to ortho  Vasovagal syncope - reviewed hospitalization reports, echocardiogram, stress test, head CT, labs.  Discussed water intake, not making sudden movements from sitting to standing, not skipping meals.  HTN - c/t same medication Lisinopril HCT 20/12.5mg  daily,  but check BPs BID and get me readings in 2wk.

## 2014-08-09 ENCOUNTER — Telehealth: Payer: Self-pay | Admitting: Medical

## 2014-08-09 NOTE — Telephone Encounter (Signed)
Refer to ortho for shoulder issues

## 2014-08-14 NOTE — Telephone Encounter (Signed)
I left all appointment details on the patient voicemail about the referral

## 2014-08-14 NOTE — Telephone Encounter (Signed)
Patient is scheduled for her orthopedics appointment to see Dr. August Saucerean at [facility]. On Aug 23, 2014 @ 845 am  [address] [place], [place]

## 2014-08-16 ENCOUNTER — Encounter (INDEPENDENT_AMBULATORY_CARE_PROVIDER_SITE_OTHER): Payer: BLUE CROSS/BLUE SHIELD

## 2014-08-16 ENCOUNTER — Ambulatory Visit (INDEPENDENT_AMBULATORY_CARE_PROVIDER_SITE_OTHER): Payer: BLUE CROSS/BLUE SHIELD | Admitting: Cardiology

## 2014-08-16 ENCOUNTER — Encounter: Payer: Self-pay | Admitting: Cardiology

## 2014-08-16 VITALS — BP 130/84 | HR 100 | Ht 67.0 in | Wt 238.8 lb

## 2014-08-16 DIAGNOSIS — R002 Palpitations: Secondary | ICD-10-CM

## 2014-08-16 NOTE — Progress Notes (Signed)
    08/16/2014 Kimberly Underwood   [birth year]  [id number]  Primary Physician Carollee HerterLALONDE,JOHN CHARLES, MD Primary Cardiologist: Dr. Royann Shiversroitoru  Reason for Visit/CC: [facility] for Syncope and Chest Pain  HPI:  Patient is a 49 year old female with a past medical history of hypertension who presents to clinic today for post hospital follow-up after being admitted for syncope and chest pain. Her syncope was thought to be possible vasovagal on top of blood pressure meds. CT of head was negative for any acute abnormalities. She had a nuclear stress tests which showed no ischemia and echo demonstrated normal systolic function. Ejection fraction is 55-60%. There were no wall motion abnormalities and no significant valvular dysfunction. She was also monitored on telemetry and had no arrhythmias. She was supposed to be  discharged home on a 30 day heart monitor, but this was never arranged.  Today in clinic, she denies any recurrent syncope/ near syncope. No further CP or dyspnea but she does note frequent palpitations.     Current Outpatient Prescriptions  Medication Sig Dispense Refill  . citalopram (CELEXA) 20 MG tablet 1/2 tablet po QHS x 1wk, then 1 tablet po QHS 30 tablet 1  . lisinopril-hydrochlorothiazide (PRINZIDE,ZESTORETIC) 20-12.5 MG per tablet   1   No current facility-administered medications for this visit.    No Known Allergies  History   Social History  . Marital Status: Single    Spouse Name: N/A  . Number of Children: N/A  . Years of Education: N/A   Occupational History  . Quality Control   . 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 Smoking status: Never Smoker   . Smokeless tobacco: Never Used  . Alcohol Use: No  . Drug Use: No  . Sexual Activity: Yes   Other Topics Concern  . Not on file   Social History Narrative   Single, 2 children, ages 49yo son, daughter age 621yo, exercise - none     Review of Systems: General: negative for chills,  fever, night sweats or weight changes.  Cardiovascular: negative for chest pain, dyspnea on exertion, edema, orthopnea, palpitations, paroxysmal nocturnal dyspnea or shortness of breath Dermatological: negative for rash Respiratory: negative for cough or wheezing Urologic: negative for hematuria Abdominal: negative for nausea, vomiting, diarrhea, bright red blood per rectum, melena, or hematemesis Neurologic: negative for visual changes, syncope, or dizziness All other systems reviewed and are otherwise negative except as noted above.    There were no vitals taken for this visit.  General appearance: alert, cooperative and no distress Neck: no carotid bruit and no JVD Lungs: clear to auscultation bilaterally Heart: regular rate and rhythm, S1, S2 normal, no murmur, click, rub or gallop Extremities: no LEE Pulses: 2+ and symmetric Skin: warm and dry Neurologic: Grossly normal  EKG not preformed  ASSESSMENT AND PLAN:   1. Syncope: felt to be vasovagal. Normal NST, head CT and 2D echo. Will order 30 day monitor as originally recommended at discharge.  2. Palpitations: notes frequent palpitations. Also with recent syncope. Will evaluate with 30 day monitor to r/o arrthymias.   3. HTN: well controlled on current regimen.  PLAN  Plan for heart monitor. If abnormal, will have patient f/u. If unremarkable, patient should continue routine care with PCP and f/u in our office as needed.   Roberto Romanoski, BRITTAINYPA-C 08/16/2014 2:48 PM

## 2014-08-16 NOTE — Patient Instructions (Signed)
Your physician recommends that you schedule a follow-up appointment As Needed  Your physician has recommended that you wear an event monitor. Event monitors are medical devices that record the heart's electrical activity. Doctors most often us these monitors to diagnose arrhythmias. Arrhythmias are problems with the speed or rhythm of the heartbeat. The monitor is a small, portable device. You can wear one while you do your normal daily activities. This is usually used to diagnose what is causing palpitations/syncope (passing out).

## 2014-08-21 ENCOUNTER — Ambulatory Visit (INDEPENDENT_AMBULATORY_CARE_PROVIDER_SITE_OTHER): Payer: BLUE CROSS/BLUE SHIELD | Admitting: Podiatry

## 2014-08-21 ENCOUNTER — Encounter: Payer: Self-pay | Admitting: Podiatry

## 2014-08-21 DIAGNOSIS — M779 Enthesopathy, unspecified: Secondary | ICD-10-CM

## 2014-08-21 DIAGNOSIS — L600 Ingrowing nail: Secondary | ICD-10-CM

## 2014-08-21 MED ORDER — CEPHALEXIN 500 MG PO CAPS: 500.0000 mg | ORAL_CAPSULE | Freq: Four times a day (QID) | ORAL | Status: DC

## 2014-08-21 MED ORDER — DICLOFENAC SODIUM 75 MG PO TBEC: 75.0000 mg | DELAYED_RELEASE_TABLET | Freq: Two times a day (BID) | ORAL | Status: DC

## 2014-08-22 NOTE — Progress Notes (Signed)
Subjective:     Patient ID: Kimberly Underwood Signer, female   DOB: 1965/09/15, 49 y.o.   MRN: [id number]  HPI patient presents having ingrown toenails that were fixed that she has been soaking excessively and not letting him air dry over the last month and also has discomfort in the plantar aspects of the metatarsal phalangeal joints of a moderate nature with pain in her feet in general   Review of Systems     Objective:   Physical Exam Neurovascular status intact muscle strength was adequate with patient having irritation in the nailbeds that were removed right over left with no proximal edema erythema drainage noted. Patient does have mild to moderate discomfort in the metatarsophalangeal joints bilateral and pain in her feet in general    Assessment:     Nailbeds appear to be healing well secondary to nail removal but she has been soaking them excessively and using creams on them along with inflammatory capsulitis tendinitis    Plan:     As a precautionary measure I did place her on antibiotics 100 mg 3 times a day cephalexin and I applied padding to reduce pressure on her feet. May require orthotics I will see her back again in the next several weeks and see how she responded antibiotics and earlier if any issues should occur

## 2014-09-18 ENCOUNTER — Ambulatory Visit (INDEPENDENT_AMBULATORY_CARE_PROVIDER_SITE_OTHER): Payer: BLUE CROSS/BLUE SHIELD | Admitting: Podiatry

## 2014-09-18 ENCOUNTER — Encounter: Payer: Self-pay | Admitting: Podiatry

## 2014-09-18 ENCOUNTER — Ambulatory Visit: Payer: Self-pay

## 2014-09-18 DIAGNOSIS — M79674 Pain in right toe(s): Secondary | ICD-10-CM

## 2014-09-18 DIAGNOSIS — L03031 Cellulitis of right toe: Secondary | ICD-10-CM

## 2014-09-18 DIAGNOSIS — L03011 Cellulitis of right finger: Secondary | ICD-10-CM

## 2014-09-19 NOTE — Progress Notes (Signed)
Subjective:     Patient ID: Kimberly Underwood, female   DOB: July 26, 1965, 49 y.o.   MRN: [id number]  HPI patient states she seems to feel somewhat better with her third toe right that she can still not wear closed in shoe. The left third nail is doing well and is crusted over and is not giving her pain   Review of Systems     Objective:   Physical Exam Neurovascular status intact with continued irritation of the right third nail with crusted tissue noted and pain when pressed. I did not note any current drainage from the area or swelling or odor but it is still quite tender when palpated    Assessment:     Slow healing nailbeds third right that was removed permanently approximately 2 months ago    Plan:     I did x-ray it as precautionary measure today and discussed that it looks excellent. We are to let this thing heal and I did offer her today the thought about opening the area up in case there was any abscess or abnormal tissue but she denies wanting and is currently and states that if it does not get better in the next 3-4 weeks or gets worse than she will pursue this option. Continue soaks padding and I applied cushion and she will reappoint if symptoms are persisting over the next 4 weeks or any issues should change

## 2014-09-26 ENCOUNTER — Encounter: Payer: Self-pay | Admitting: Medical

## 2014-09-26 ENCOUNTER — Ambulatory Visit (INDEPENDENT_AMBULATORY_CARE_PROVIDER_SITE_OTHER): Payer: BLUE CROSS/BLUE SHIELD | Admitting: Medical

## 2014-09-26 VITALS — BP 102/80 | HR 84 | Temp 98.2°F | Resp 15 | Ht 68.0 in | Wt 242.0 lb

## 2014-09-26 DIAGNOSIS — M1990 Unspecified osteoarthritis, unspecified site: Secondary | ICD-10-CM

## 2014-09-26 DIAGNOSIS — G243 Spasmodic torticollis: Secondary | ICD-10-CM | POA: Diagnosis not present

## 2014-09-26 DIAGNOSIS — Z113 Encounter for screening for infections with a predominantly sexual mode of transmission: Secondary | ICD-10-CM

## 2014-09-26 DIAGNOSIS — I10 Essential (primary) hypertension: Secondary | ICD-10-CM

## 2014-09-26 DIAGNOSIS — N3281 Overactive bladder: Secondary | ICD-10-CM

## 2014-09-26 DIAGNOSIS — R55 Syncope and collapse: Secondary | ICD-10-CM | POA: Diagnosis not present

## 2014-09-26 DIAGNOSIS — Z1239 Encounter for other screening for malignant neoplasm of breast: Secondary | ICD-10-CM

## 2014-09-26 DIAGNOSIS — F419 Anxiety disorder, unspecified: Secondary | ICD-10-CM

## 2014-09-26 DIAGNOSIS — Z0000 Encounter for general adult medical examination without abnormal findings: Secondary | ICD-10-CM | POA: Diagnosis not present

## 2014-09-26 DIAGNOSIS — M1000 Idiopathic gout, unspecified site: Secondary | ICD-10-CM | POA: Diagnosis not present

## 2014-09-26 DIAGNOSIS — R0789 Other chest pain: Secondary | ICD-10-CM | POA: Diagnosis not present

## 2014-09-26 DIAGNOSIS — G5600 Carpal tunnel syndrome, unspecified upper limb: Secondary | ICD-10-CM

## 2014-09-26 DIAGNOSIS — E669 Obesity, unspecified: Secondary | ICD-10-CM | POA: Diagnosis not present

## 2014-09-26 DIAGNOSIS — N39498 Other specified urinary incontinence: Secondary | ICD-10-CM

## 2014-09-26 LAB — POCT URINALYSIS DIPSTICK
BILIRUBIN UA: NEGATIVE
GLUCOSE UA: NEGATIVE
Ketones, UA: NEGATIVE
LEUKOCYTES UA: NEGATIVE
NITRITE UA: NEGATIVE
Protein, UA: NEGATIVE
Spec Grav, UA: >=1.03
Urobilinogen, UA: NEGATIVE
pH, UA: 6

## 2014-09-26 MED ORDER — SOLIFENACIN SUCCINATE 5 MG PO TABS: 5.0000 mg | ORAL_TABLET | Freq: Every day | ORAL | Status: DC

## 2014-09-26 MED ORDER — CITALOPRAM HYDROBROMIDE 20 MG PO TABS: ORAL_TABLET | ORAL | Status: DC

## 2014-09-26 MED ORDER — LISINOPRIL-HYDROCHLOROTHIAZIDE 20-12.5 MG PO TABS: 1.0000 | ORAL_TABLET | Freq: Every day | ORAL | Status: DC

## 2014-09-26 NOTE — Patient Instructions (Signed)
Recommendations:  We will call with lab results  Go for mammogram  You will be due for colonoscopy next year  Begin Vesicare once daily in the morning for overactive bladder See your eye doctor yearly for routine vision care. See your dentist yearly for routine dental care including hygiene visits twice yearly.  Colonoscopy A colonoscopy is an exam to look at the entire large intestine (colon). This exam can help find problems such as tumors, polyps, inflammation, and areas of bleeding. The exam takes about 1 hour.  LET [facility] CARE PROVIDER KNOW ABOUT:   Any allergies you have.  All medicines you are taking, including vitamins, herbs, eye drops, creams, and over-the-counter medicines.  Previous problems you or members of your family have had with the use of anesthetics.  Any blood disorders you have.  Previous surgeries you have had.  Medical conditions you have. RISKS AND COMPLICATIONS  Generally, this is a safe procedure. However, as with any procedure, complications can occur. Possible complications include:  Bleeding.  Tearing or rupture of the colon wall.  Reaction to medicines given during the exam.  Infection (rare). BEFORE THE PROCEDURE   Ask your health care provider about changing or stopping your regular medicines.  You may be prescribed an oral bowel prep. This involves drinking a large amount of medicated liquid, starting the day before your procedure. The liquid will cause you to have multiple loose stools until your stool is almost clear or light green. This cleans out your colon in preparation for the procedure.  Do not eat or drink anything else once you have started the bowel prep, unless your health care provider tells you it is safe to do so.  Arrange for someone to drive you home after the procedure. PROCEDURE   You will be given medicine to help you relax (sedative).  You will lie on your side with your knees bent.  A long, flexible tube  with a light and camera on the end (colonoscope) will be inserted through the rectum and into the colon. The camera sends video back to a computer screen as it moves through the colon. The colonoscope also releases carbon dioxide gas to inflate the colon. This helps your health care provider see the area better.  During the exam, your health care provider may take a small tissue sample (biopsy) to be examined under a microscope if any abnormalities are found.  The exam is finished when the entire colon has been viewed. AFTER THE PROCEDURE   Do not drive for 24 hours after the exam.  You may have a small amount of blood in your stool.  You may pass moderate amounts of gas and have mild abdominal cramping or bloating. This is caused by the gas used to inflate your colon during the exam.  Ask when your test results will be ready and how you will get your results. Make sure you get your test results. Document Released: 03/21/2000 Document Revised: 01/12/2013 Document Reviewed: 11/29/2012 [employer] Patient Information 2015 [place], [place]. This information is not intended to replace advice given to you by your health care provider. Make sure you discuss any questions you have with your health care provider.

## 2014-09-26 NOTE — Progress Notes (Signed)
Subjective:   HPI  Kimberly Underwood is a 49 y.o. female who presents for a complete physical.  Medical care team includes:  Eye doctor  Dentist  Podiatry, Dr. Doran Durand, DAVID [facility], PA-C here for primary care   Preventative care: Last ophthalmology visit: EYE - LAST EYE EXAM 07/2014 Last dental visit: YES SEEN TWICE A YEAR Last colonoscopy:N/A Last mammogram:2014 [facility] Last gynecological exam: [facility] Last EKG:06/2011 Last labs: recent  Prior vaccinations: TD or Tdap: 06/2011 Influenza: 9/ 2015 Pneumococcal:N/A Shingles/Zostavax:N/A  Concerns: Urinates all the time > 8 times daily for last few years.   Gets some incontinence with laughing, coughing.   Reviewed their medical, surgical, family, social, medication, and allergy history and updated chart as appropriate.  Past Medical History  Diagnosis Date  . CARPAL TUNNEL SYNDROME, BILATERAL 05/22/2010  . HYPERTENSION 10/25/2008  . Dental caries   . Gout   . Arthritis 2014  . Wears glasses   . Overactive bladder   . History of echocardiogram 07/17/14    TTE, normal LV function, 55-60% EF  . Normal cardiac stress test 07/17/14    Dr. Peter Swaziland  . History of EKG 07/2014    PVCs, otherwise normal  . History of nuclear stress test 07/2014    normal  . History of CT scan of head 07/2014    due to syncope, normal scan  . Anxiety     Past Surgical History  Procedure Laterality Date  . Wisdom tooth extraction  ~ 1985  . Toenail excision Bilateral 2000's    "big toes" (12/23/2012)  . Abdominal hysterectomy  09/2010    fibroid, partial hysterectomy     History   Social History  . Marital Status: Single    Spouse Name: N/A  . Number of Children: N/A  . Years of Education: N/A   Occupational History  . Quality Control   . 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 Smoking status: Never Smoker   . Smokeless tobacco: Never Used  . Alcohol Use: No  . Drug Use: No  .  Sexual Activity: Yes   Other Topics Concern  . Not on file   Social History Narrative   Single, 2 children, ages 50yo son, daughter age 38yo, exercise - some with walking.  Works in Theatre stage manager at [employer].   As of 09/2014    Family History  Problem Relation Age of Onset  . Hypertension Father   . Gout Father   . Clotting disorder Father   . Lung disease Father     listeria  . Stroke Father   . Cancer Maternal Grandfather     lung cancer (smoking)  . Cancer Mother     breast cancer  . Migraines Sister   . Heart disease Daughter     heart murmur  . Diabetes Neg Hx   . Stroke Maternal Grandmother      Current outpatient prescriptions:  .  citalopram (CELEXA) 20 MG tablet, 1 tablet po QHS, Disp: 90 tablet, Rfl: 1 .  lisinopril-hydrochlorothiazide (PRINZIDE,ZESTORETIC) 20-12.5 MG per tablet, Take 1 tablet by mouth daily., Disp: 90 tablet, Rfl: 3 .  diclofenac (VOLTAREN) 75 MG EC tablet, Take 1 tablet (75 mg total) by mouth 2 (two) times daily., Disp: 50 tablet, Rfl: 2 .  solifenacin (VESICARE) 5 MG tablet, Take 1 tablet (5 mg total) by mouth daily., Disp: 14 tablet, Rfl: 0  No Known Allergies  Review of Systems Constitutional: -fever, -chills, -sweats, -unexpected weight change, -decreased appetite, -fatigue Allergy: -sneezing, -itching, -congestion Dermatology: -changing moles, --rash, -lumps ENT: -runny nose, -ear pain, -sore throat, -hoarseness, -sinus pain, -teeth pain, - ringing in ears, -hearing loss, -nosebleeds Cardiology: -chest pain, -palpitations, -swelling, -difficulty breathing when lying flat, -waking up short of breath Respiratory: -cough, -shortness of breath, -difficulty breathing with exercise or exertion, -wheezing, -coughing up blood Gastroenterology: -abdominal pain, -nausea, -vomiting, -diarrhea, -constipation, -blood in stool, -changes in bowel movement, -difficulty swallowing or eating Hematology: -bleeding, -bruising   Musculoskeletal: -joint aches, -muscle aches, -joint swelling, -back pain, -neck pain, -cramping, -changes in gait Ophthalmology: denies vision changes, eye redness, itching, discharge Urology: -burning with urination, -difficulty urinating, -blood in urine, +urinary frequency, -urgency, -incontinence Neurology: -headache, -weakness, -tingling, -numbness, -memory loss, -falls, -dizziness Psychology: -depressed mood, -agitation, -sleep problems     Objective:   Physical Exam  BP 102/80 mmHg  Pulse 84  Temp(Src) 98.2 F (36.8 C) (Oral)  Resp 15  Ht 5\' 8"  (1.727 m)  Wt 242 lb (109.77 kg)  BMI 36.80 kg/m2  General appearance: alert, no distress, WD/WN, AA female Skin: few scatted macules and skin tags of neck, upper back, no worrisome lesions HEENT: normocephalic, conjunctiva/corneas normal, sclerae anicteric, PERRLA, EOMi, nares patent, no discharge or erythema, pharynx normal Oral cavity: MMM, tongue normal, teeth normal Neck: supple, no lymphadenopathy, no thyromegaly, no masses, normal ROM Chest: non tender, normal shape and expansion Heart: RRR, normal S1, S2, no murmurs Lungs: CTA bilaterally, no wheezes, rhonchi, or rales Abdomen: +bs, soft, non tender, non distended, no masses, no hepatomegaly, no splenomegaly, no bruits Back: non tender, normal ROM, no scoliosis Musculoskeletal: upper extremities non tender, no obvious deformity, normal ROM throughout, lower extremities non tender, no obvious deformity, normal ROM throughout Extremities: no edema, no cyanosis, no clubbing Pulses: 2+ symmetric, upper and lower extremities, normal cap refill Neurological: cervical dystonia noted, somewhat tremor like movements of neck unchanged, otherwise alert, oriented x 3, CN2-12 intact, strength normal upper extremities and lower extremities, sensation normal throughout, DTRs 2+ throughout, no cerebellar signs, gait normal Psychiatric: normal affect, behavior normal, pleasant  Breast:  nontender, no masses or lumps, no skin changes, no nipple discharge or inversion, no axillary lymphadenopathy Gyn: Normal external genitalia without lesions, vagina with normal mucosa, s/p hysterectomy, no abnormal vaginal discharge.  Adnexa not enlarged, nontender, no masses. swabs performed.  Exam chaperoned by nurse. Rectal: deferred    Assessment and Plan :    Encounter Diagnoses  Name Primary?  . Encounter for health maintenance examination in adult Yes  . Screen for STD (sexually transmitted disease)   . Overactive bladder   . Cervical dystonia   . Carpal tunnel syndrome, unspecified laterality   . Arthritis   . Essential hypertension, benign   . Idiopathic gout, unspecified chronicity, unspecified site   . Obesity (BMI 30-39.9)   . Syncope, unspecified syncope type   . Screening for breast cancer   . Chest tightness   . Anxiety   . Other urinary incontinence     Physical exam - discussed healthy lifestyle, diet, exercise, preventative care, vaccinations, and addressed their concerns.  Handout given. See your eye doctor yearly for routine vision care. See your dentist yearly for routine dental care including hygiene visits twice yearly. Go for mammogram Due for colonoscopy next year Begin trial of Vesicare for OAB.  Discussed risks/benefits of medication C/t same medications for anxiety and hypertension Work on improving exercise, diet, and weight  loss efforts Other listed diagnosis - carpal tunnel, syncope, gout, arthritis, syncope all not currently active or problematic  Follow-up pending labs

## 2014-09-27 ENCOUNTER — Other Ambulatory Visit: Payer: Self-pay | Admitting: Medical

## 2014-09-27 LAB — GC/CHLAMYDIA PROBE AMP
CT PROBE, AMP APTIMA: NEGATIVE
GC Probe RNA: NEGATIVE

## 2014-09-27 LAB — VITAMIN D 25 HYDROXY (VIT D DEFICIENCY, FRACTURES): Vit D, 25-Hydroxy: 14 ng/mL — ABNORMAL LOW (ref 30–100)

## 2014-09-27 LAB — RPR

## 2014-09-27 MED ORDER — VITAMIN D (ERGOCALCIFEROL) 1.25 MG (50000 UNIT) PO CAPS: 50000.0000 [IU] | ORAL_CAPSULE | ORAL | Status: DC

## 2014-10-10 ENCOUNTER — Other Ambulatory Visit: Payer: Self-pay | Admitting: Medical

## 2014-10-10 DIAGNOSIS — Z1231 Encounter for screening mammogram for malignant neoplasm of breast: Secondary | ICD-10-CM

## 2014-10-23 ENCOUNTER — Other Ambulatory Visit: Payer: Self-pay | Admitting: Orthopedic Surgery

## 2014-10-23 DIAGNOSIS — M25511 Pain in right shoulder: Secondary | ICD-10-CM

## 2014-10-25 ENCOUNTER — Ambulatory Visit (HOSPITAL_COMMUNITY)
Admission: RE | Admit: 2014-10-25 | Discharge: 2014-10-25 | Disposition: A | Discharge status: Home / Self Care | Payer: BLUE CROSS/BLUE SHIELD | Source: Ambulatory Visit | Attending: Medical | Admitting: Medical

## 2014-10-25 ENCOUNTER — Other Ambulatory Visit: Payer: Self-pay | Admitting: Medical

## 2014-10-25 DIAGNOSIS — Z1231 Encounter for screening mammogram for malignant neoplasm of breast: Secondary | ICD-10-CM

## 2014-11-02 ENCOUNTER — Ambulatory Visit
Admission: RE | Admit: 2014-11-02 | Discharge: 2014-11-02 | Disposition: A | Discharge status: Home / Self Care | Payer: BLUE CROSS/BLUE SHIELD | Source: Ambulatory Visit | Attending: Orthopedic Surgery | Admitting: Orthopedic Surgery

## 2014-11-02 DIAGNOSIS — M25511 Pain in right shoulder: Secondary | ICD-10-CM

## 2014-11-02 MED ORDER — IOHEXOL 180 MG/ML  SOLN
13.0000 mL | Freq: Once | INTRAMUSCULAR | Status: AC | PRN
  Administered 2014-11-02: 13 mL via INTRA_ARTICULAR

## 2014-11-13 ENCOUNTER — Ambulatory Visit (HOSPITAL_COMMUNITY)
Admission: RE | Admit: 2014-11-13 | Discharge: 2014-11-13 | Disposition: A | Discharge status: Home / Self Care | Payer: BLUE CROSS/BLUE SHIELD | Source: Ambulatory Visit | Attending: Medical | Admitting: Medical

## 2014-11-13 DIAGNOSIS — Z1231 Encounter for screening mammogram for malignant neoplasm of breast: Secondary | ICD-10-CM | POA: Insufficient documentation

## 2014-11-23 ENCOUNTER — Other Ambulatory Visit: Payer: Self-pay

## 2014-12-18 NOTE — Telephone Encounter (Signed)
Call and see how she is doing, what is orthopedist plan for her at this time?

## 2014-12-18 NOTE — Telephone Encounter (Signed)
Pt is doing fine and has an appt today with [place] ortho @ 3:15pm

## 2015-04-03 ENCOUNTER — Encounter (HOSPITAL_COMMUNITY): Payer: Self-pay | Admitting: Emergency Medicine

## 2015-04-03 ENCOUNTER — Emergency Department (HOSPITAL_COMMUNITY)
Admission: EM | Admit: 2015-04-03 | Discharge: 2015-04-03 | Disposition: A | Discharge status: Home / Self Care | Payer: BLUE CROSS/BLUE SHIELD | Attending: Emergency Medicine | Admitting: Emergency Medicine

## 2015-04-03 ENCOUNTER — Emergency Department (HOSPITAL_COMMUNITY): Payer: BLUE CROSS/BLUE SHIELD

## 2015-04-03 DIAGNOSIS — J208 Acute bronchitis due to other specified organisms: Secondary | ICD-10-CM | POA: Insufficient documentation

## 2015-04-03 DIAGNOSIS — Z8669 Personal history of other diseases of the nervous system and sense organs: Secondary | ICD-10-CM | POA: Insufficient documentation

## 2015-04-03 DIAGNOSIS — R05 Cough: Secondary | ICD-10-CM | POA: Diagnosis present

## 2015-04-03 DIAGNOSIS — Z79899 Other long term (current) drug therapy: Secondary | ICD-10-CM | POA: Insufficient documentation

## 2015-04-03 DIAGNOSIS — Z87448 Personal history of other diseases of urinary system: Secondary | ICD-10-CM | POA: Insufficient documentation

## 2015-04-03 DIAGNOSIS — I10 Essential (primary) hypertension: Secondary | ICD-10-CM | POA: Diagnosis not present

## 2015-04-03 DIAGNOSIS — Z8739 Personal history of other diseases of the musculoskeletal system and connective tissue: Secondary | ICD-10-CM | POA: Insufficient documentation

## 2015-04-03 DIAGNOSIS — F419 Anxiety disorder, unspecified: Secondary | ICD-10-CM | POA: Diagnosis not present

## 2015-04-03 MED ORDER — ALBUTEROL SULFATE HFA 108 (90 BASE) MCG/ACT IN AERS
2.0000 | INHALATION_SPRAY | RESPIRATORY_TRACT | Status: DC | PRN
Start: 2015-04-03 — End: 2015-04-03
  Administered 2015-04-03: 2 via RESPIRATORY_TRACT
  Filled 2015-04-03: qty 6.7

## 2015-04-03 MED ORDER — BENZONATATE 100 MG PO CAPS: 100.0000 mg | ORAL_CAPSULE | Freq: Three times a day (TID) | ORAL | Status: DC

## 2015-04-03 NOTE — ED Notes (Signed)
Pt in restroom when called for triage 

## 2015-04-03 NOTE — Discharge Instructions (Signed)
Return to the ED with any concerns including difficulty breathing despite using albuterol every 4 hours, not drinking fluids, decreased urine output, vomiting and not able to keep down liquids or medications, decreased level of alertness/lethargy, or any other alarming symptoms °

## 2015-04-03 NOTE — ED Notes (Signed)
Pt in xray

## 2015-04-03 NOTE — ED Notes (Signed)
Pt reports non-productive cough for the past 3 weeks accompanied by CP worse with coughing. Earlier had a runny nose and sore throat.

## 2015-04-03 NOTE — ED Notes (Signed)
Pt escorted to discharge window. Pt verbalized understanding discharge instructions. In no acute distress.  

## 2015-04-03 NOTE — ED Notes (Signed)
Pt alert and oriented x4. Respirations even and unlabored, bilateral symmetrical rise and fall of chest. Skin warm and dry. In no acute distress. Denies needs.   

## 2015-04-03 NOTE — ED Provider Notes (Signed)
CSN: [id number]     Arrival date & time 04/03/15  [id number] History   First MD Initiated Contact with Patient 04/03/15 [phone]     Chief Complaint  Patient presents with  . Cough  . Chest Pain     (Consider location/radiation/quality/duration/timing/severity/associated sxs/prior Treatment) HPI  Pt presenting with c/o cough and congestion. She states symptoms began 3 weeks ago with congestion, sore throat, fatigue and cough.  Other symptoms have resolved, but cough has continued.  Cough is nonproductive.  No fever/chills.  No difficulty breathing. No leg swelling.  She is drinking liquids well. No vomiting or diarrhea.  She has not had any treatment for her symptoms prior to arrival.  There are no other associated systemic symptoms, there are no other alleviating or modifying factors.   Past Medical History  Diagnosis Date  . CARPAL TUNNEL SYNDROME, BILATERAL 05/22/2010  . HYPERTENSION 10/25/2008  . Dental caries   . Gout   . Arthritis 2014  . Wears glasses   . Overactive bladder   . History of echocardiogram 07/17/14    TTE, normal LV function, 55-60% EF  . Normal cardiac stress test 07/17/14    Dr. Peter SwazilandJordan  . History of EKG 07/2014    PVCs, otherwise normal  . History of nuclear stress test 07/2014    normal  . History of CT scan of head 07/2014    due to syncope, normal scan  . Anxiety    Past Surgical History  Procedure Laterality Date  . Wisdom tooth extraction  ~ 1985  . Toenail excision Bilateral 2000's    "big toes" (12/23/2012)  . Abdominal hysterectomy  09/2010    fibroid, partial hysterectomy    Family History  Problem Relation Age of Onset  . Hypertension Father   . Gout Father   . Clotting disorder Father   . Lung disease Father     listeria  . Stroke Father   . Cancer Maternal Grandfather     lung cancer (smoking)  . Cancer Mother     breast cancer  . Migraines Sister   . Heart disease Daughter     heart murmur  . Diabetes Neg Hx   . Stroke Maternal  Grandmother    Social History  Substance Use Topics  . Smoking status: Never Smoker   . Smokeless tobacco: Never Used  . Alcohol Use: No   OB History    No data available     Review of Systems  ROS reviewed and all otherwise negative except for mentioned in HPI    Allergies  Review of patient's allergies indicates no known allergies.  Home Medications   Prior to Admission medications   Medication Sig Start Date End Date Taking? Authorizing Provider  albuterol (PROVENTIL HFA;VENTOLIN HFA) 108 (90 BASE) MCG/ACT inhaler Inhale 2 puffs into the lungs every 6 (six) hours as needed for wheezing or shortness of breath.   Yes Historical Provider, MD  Camphor-Eucalyptus-Menthol (VICKS VAPORUB EX) Apply 1 application topically daily as needed (congestion).   Yes Historical Provider, MD  guaiFENesin (ROBITUSSIN) 100 MG/5ML liquid Take 200 mg by mouth 3 (three) times daily as needed for cough.   Yes Historical Provider, MD  lisinopril-hydrochlorothiazide (PRINZIDE,ZESTORETIC) 20-12.5 MG per tablet Take 1 tablet by mouth daily. 09/26/14  Yes David S Tysinger, PA-C  Phenyleph-Diphenhyd-DM-APAP [facility](THERAFLU SEVERE COLD & COUGH) PACKET MISC Take 1 packet by mouth 2 (two) times daily as needed (cold symptoms).   Yes Historical Provider, MD  benzonatate Kimberlee Nearing(TESSALON)  100 MG capsule Take 1 capsule (100 mg total) by mouth every 8 (eight) hours. 04/03/15   Jerelyn Scott, MD  citalopram (CELEXA) 20 MG tablet 1 tablet po QHS 09/26/14   Kermit Balo Tysinger, PA-C   BP 125/74 mmHg  Pulse 69  Temp(Src) 97.9 F (36.6 C) (Oral)  Resp 16  SpO2 97%  Vitals reviewed Physical Exam  Physical Examination: General appearance - alert, well appearing, and in no distress Mental status - alert, oriented to person, place, and time Eyes - no conjunctival injection, no scleral icterus Mouth - mucous membranes moist, pharynx normal without lesions Chest - clear to auscultation, no wheezes, rales or rhonchi, symmetric air  entry, normal respiratory effort Heart - normal rate, regular rhythm, normal S1, S2, no murmurs, rubs, clicks or gallops Abdomen - soft, nontender, nondistended, no masses or organomegaly Neurological - alert, oriented, normal speech Extremities - peripheral pulses normal, no pedal edema, no clubbing or cyanosis Skin - normal coloration and turgor, no rashes  ED Course  Procedures (including critical care time) Labs Review Labs Reviewed - No data to display  Imaging Review Dg Chest 2 View  04/03/2015  CLINICAL DATA:  Central chest pain today. Dry cough for a few weeks. Some dizziness. History of hypertension. EXAM: CHEST  2 VIEW COMPARISON:  07/16/2014 FINDINGS: The cardiomediastinal silhouette is within normal limits. The lungs are well inflated and clear. There is no evidence of pleural effusion or pneumothorax. No acute osseous abnormality is identified. IMPRESSION: No active cardiopulmonary disease. Electronically Signed   By: Sebastian Ache M.D.   On: 04/03/2015 10:09   I have personally reviewed and evaluated these images and lab results as part of my medical decision-making.   EKG Interpretation   Date/Time:  Tuesday April 03 2015 08:56:54 EST Ventricular Rate:  90 PR Interval:  163 QRS Duration: 82 QT Interval:  348 QTC Calculation: 426 R Axis:   60 Text Interpretation:  Sinus rhythm Probable left atrial enlargement Low  voltage, precordial leads ST elev, probable normal early repol pattern  Baseline wander in lead(s) II III aVF No significant change since last  tracing Confirmed by [facility]  MD, MARTHA [phone]) on 04/03/2015 9:53:50 AM      MDM   Final diagnoses:  Viral bronchitis    Pt presenting with c/o cough which has been ongoing for the past 2 -3 weeks after viral illness.  CXR reassuring- no signs of pneumonia.  Pt is afebrile with normal work of breathing.  Pt given albuterol inhaler, most likely post viral bronchospasm also given rx for tessalon.  Discharged  with strict return precautions.  Pt agreeable with plan.    Jerelyn Scott, MD 04/03/15 [phone]

## 2015-04-10 ENCOUNTER — Ambulatory Visit (INDEPENDENT_AMBULATORY_CARE_PROVIDER_SITE_OTHER): Payer: BLUE CROSS/BLUE SHIELD | Admitting: Medical

## 2015-04-10 ENCOUNTER — Encounter: Payer: Self-pay | Admitting: Medical

## 2015-04-10 VITALS — BP 110/80 | HR 80 | Resp 15 | Wt 235.0 lb

## 2015-04-10 DIAGNOSIS — G4489 Other headache syndrome: Secondary | ICD-10-CM

## 2015-04-10 DIAGNOSIS — M25561 Pain in right knee: Secondary | ICD-10-CM | POA: Diagnosis not present

## 2015-04-10 DIAGNOSIS — E669 Obesity, unspecified: Secondary | ICD-10-CM | POA: Diagnosis not present

## 2015-04-10 DIAGNOSIS — J209 Acute bronchitis, unspecified: Secondary | ICD-10-CM | POA: Diagnosis not present

## 2015-04-10 DIAGNOSIS — G4700 Insomnia, unspecified: Secondary | ICD-10-CM | POA: Diagnosis not present

## 2015-04-10 DIAGNOSIS — M25562 Pain in left knee: Secondary | ICD-10-CM

## 2015-04-10 NOTE — Progress Notes (Signed)
Subjective: Chief Complaint  Patient presents with  . Follow-up    er follow up. said that she is feeling better. said that her chest only feels sore when she coughs. mentioned knee "stiffness"    Here for ED f/u.  Went to the emergency dept recently for over a few week hx/o cough and congestion, was put on albuterol and tessalon Perles for bronchitis.  Still has some dry cough, but overall much improved.    Denies lingering cough for weeks to months, so no concern about ACEi cough.     Her main concerns today are insomnia and bilat knee pains and headaches  Been having some bilat knee pain x 1.5 months, intermittent.  Sitting in low chair or low toilet can aggravate the pain.   Sometimes gets swelling in the knees.  Denies specific injury or fall.   Not using anything for pain.  Working [employer] products, sitting and standing throughout the day  Exercising some at [employer], treadmill.    Has trouble getting to sleep often, but once asleep stays asleep. Not sure why.  She endorses no set to bed time, does sometimes take day time naps, but hasn't used any particular sleep aids.  No major stress currently.     Been having 3 days of intermittent bitemporal headaches.    Headache is mild to moderate, but no paresthesias, no photophobia or phonophobia, no vision or hearing changes, no gait changes, no other aggravating or relieving factors.   Past Medical History  Diagnosis Date  . CARPAL TUNNEL SYNDROME, BILATERAL 05/22/2010  . HYPERTENSION 10/25/2008  . Dental caries   . Gout   . Arthritis 2014  . Wears glasses   . Overactive bladder   . History of echocardiogram 07/17/14    TTE, normal LV function, 55-60% EF  . Normal cardiac stress test 07/17/14    Dr. Peter Swaziland  . History of EKG 07/2014    PVCs, otherwise normal  . History of nuclear stress test 07/2014    normal  . History of CT scan of head 07/2014    due to syncope, normal scan  . Anxiety    ROS as in  subjective  Objective: BP 110/80 mmHg  Pulse 80  Resp 15  Wt 235 lb (106.595 kg)  SpO2 96%  General appearance: alert, no distress, WD/WN, obese AA female HEENT: normocephalic, sclerae anicteric, TMs pearly, nares patent, no discharge or erythema, pharynx normal Oral cavity: MMM, no lesions Neck: supple, no lymphadenopathy, no thyromegaly, no masses Heart: RRR, normal S1, S2, no murmurs Lungs: CTA bilaterally, no wheezes, rhonchi, or rales Right knee with some mild bony arthritis changes of tibial plateau medially and laterally, otherwise nontender, no swelling, normal knee ROM, no laxity, no pain with special tests, feet with decreased arches bilat, knees bow in slightly Back: slight asymmetry of back creases, suggesting mild leg length discrepancy or mild scoliosis Pulses: 2+ symmetric, upper and lower extremities, normal cap refill Neuro: normal LE strength and sensation, otherwise non focal exam   Assessment: Encounter Diagnoses  Name Primary?  . Insomnia Yes  . Bilateral knee pain   . Acute bronchitis, unspecified organism   . Obesity   . Headache syndrome     Plan: Insomnia - discussed sleep hygiene, avoid day time naps, work on consistent sleep schedule.   Can begin Benadryl or Melatonin daily OTC.  F/u prn bilat knee pain - likely related to obesity and body mechanics and poor arches.  She will  work on changing to shoe with better arch support.  If not seeing impairments, will consider knee xrays.  F/u in 3-4 wk Work on weight loss changes bronchitis - mostly resolved.  C/t albuterol and tessalon Perles prn.  Headaches - likely secondary to sleep issues, cough and recent bronchitis.   Can use OTC analgesic, begin sleep improvement recommendations.  F/u if not seeing improvement in a week

## 2015-09-11 ENCOUNTER — Encounter (HOSPITAL_COMMUNITY): Payer: Self-pay | Admitting: Emergency Medicine

## 2015-09-11 ENCOUNTER — Ambulatory Visit (HOSPITAL_COMMUNITY)
Admission: EM | Admit: 2015-09-11 | Discharge: 2015-09-11 | Disposition: A | Discharge status: Home / Self Care | Payer: BLUE CROSS/BLUE SHIELD | Attending: Emergency Medicine | Admitting: Emergency Medicine

## 2015-09-11 DIAGNOSIS — T783XXA Angioneurotic edema, initial encounter: Secondary | ICD-10-CM

## 2015-09-11 DIAGNOSIS — T7840XA Allergy, unspecified, initial encounter: Secondary | ICD-10-CM

## 2015-09-11 MED ORDER — METHYLPREDNISOLONE SODIUM SUCC 125 MG IJ SOLR
INTRAMUSCULAR | Status: AC
Start: 2015-09-11 — End: 2015-09-11
  Filled 2015-09-11: qty 2

## 2015-09-11 MED ORDER — METHYLPREDNISOLONE SODIUM SUCC 125 MG IJ SOLR
125.0000 mg | Freq: Once | INTRAMUSCULAR | Status: AC
  Administered 2015-09-11: 125 mg via INTRAMUSCULAR

## 2015-09-11 NOTE — ED Notes (Signed)
Pt had facial swelling last night along with her lips swelling.  She noted some itching.  Pt is on Lisinopril but has been taking that for 4-5 years with no recent change in dosage.  She denies any swelling of the throat or tongue with no issues breathing or swallowing.

## 2015-09-11 NOTE — ED Provider Notes (Signed)
CSN: [id number]     Arrival date & time 09/11/15  1258 History   First MD Initiated Contact with Patient 09/11/15 1312     Chief Complaint  Patient presents with  . Facial Swelling  . Oral Swelling   (Consider location/radiation/quality/duration/timing/severity/associated sxs/prior Treatment) HPI She is a 50 year old woman here for evaluation of face and lip swelling. She states when she woke up this morning she had swelling of her upper lip and face. She denies any swelling of the tongue or throat. No shortness of breath or abdominal pain. No wheezing. She does report using a new pepper in her dinner last night.  She is on lisinopril, but has been on this for multiple years and has not had a recent dose change.  She has not taken any medications for this yet. She states the swelling has gone down a little bit.  Past Medical History  Diagnosis Date  . CARPAL TUNNEL SYNDROME, BILATERAL 05/22/2010  . HYPERTENSION 10/25/2008  . Dental caries   . Gout   . Arthritis 2014  . Wears glasses   . Overactive bladder   . History of echocardiogram 07/17/14    TTE, normal LV function, 55-60% EF  . Normal cardiac stress test 07/17/14    Dr. Peter SwazilandJordan  . History of EKG 07/2014    PVCs, otherwise normal  . History of nuclear stress test 07/2014    normal  . History of CT scan of head 07/2014    due to syncope, normal scan  . Anxiety    Past Surgical History  Procedure Laterality Date  . Wisdom tooth extraction  ~ 1985  . Toenail excision Bilateral 2000's    "big toes" (12/23/2012)  . Abdominal hysterectomy  09/2010    fibroid, partial hysterectomy    Family History  Problem Relation Age of Onset  . Hypertension Father   . Gout Father   . Clotting disorder Father   . Lung disease Father     listeria  . Stroke Father   . Cancer Maternal Grandfather     lung cancer (smoking)  . Cancer Mother     breast cancer  . Migraines Sister   . Heart disease Daughter     heart murmur  . Diabetes Neg  Hx   . Stroke Maternal Grandmother    Social History  Substance Use Topics  . Smoking status: Never Smoker   . Smokeless tobacco: Never Used  . Alcohol Use: No   OB History    No data available     Review of Systems As in history of present illness Allergies  Review of patient's allergies indicates no known allergies.  Home Medications   Prior to Admission medications   Medication Sig Start Date End Date Taking? Authorizing Provider  lisinopril-hydrochlorothiazide (PRINZIDE,ZESTORETIC) 20-12.5 MG per tablet Take 1 tablet by mouth daily. 09/26/14  Yes Kermit Baloavid S Tysinger, PA-C  albuterol (PROVENTIL HFA;VENTOLIN HFA) 108 (90 BASE) MCG/ACT inhaler Inhale 2 puffs into the lungs every 6 (six) hours as needed for wheezing or shortness of breath. Reported on 04/10/2015    Historical Provider, MD  benzonatate (TESSALON) 100 MG capsule Take 1 capsule (100 mg total) by mouth every 8 (eight) hours. 04/03/15   Jerelyn ScottMartha Linker, MD  Camphor-Eucalyptus-Menthol (VICKS VAPORUB EX) Apply 1 application topically daily as needed (congestion).    Historical Provider, MD  guaiFENesin (ROBITUSSIN) 100 MG/5ML liquid Take 200 mg by mouth 3 (three) times daily as needed for cough. Reported on 04/10/2015  Historical Provider, MD  Phenyleph-Diphenhyd-DM-APAP [facility] SEVERE COLD & COUGH) PACKET MISC Take 1 packet by mouth 2 (two) times daily as needed (cold symptoms). Reported on 04/10/2015    Historical Provider, MD   Meds Ordered and Administered this Visit   Medications  methylPREDNISolone sodium succinate (SOLU-MEDROL) 125 mg/2 mL injection 125 mg (125 mg Intramuscular Given 09/11/15 1410)    BP 116/82 mmHg  Pulse 88  Temp(Src) 98.1 F (36.7 C) (Oral)  Resp 16  SpO2 97% No data found.   Physical Exam  Constitutional: She is oriented to person, place, and time. She appears well-developed and well-nourished. No distress.  HENT:  Mouth/Throat: Oropharynx is clear and moist. No oropharyngeal exudate.  Upper  lip is swollen, worse on the right side. Subtle swelling of the right cheek and jaw area.  Cardiovascular: Normal rate.   Pulmonary/Chest: Effort normal and breath sounds normal. No respiratory distress. She has no wheezes. She has no rales.  Neurological: She is alert and oriented to person, place, and time.    ED Course  Procedures (including critical care time)  Labs Review Labs Reviewed - No data to display  Imaging Review No results found.    MDM   1. Allergic reaction, initial encounter   2. Angioedema of lips, initial encounter    Solu-Medrol given here. Daily allergy pill and Benadryl at home for the next several days. Will hold off on stopping lisinopril at this time. I have instructed the patient to let her primary care doctor know what happened. Follow-up as needed.    Charm Rings, MD 09/11/15 1434

## 2015-09-11 NOTE — Discharge Instructions (Signed)
The swelling is a reaction to something. This could possibly be to the lisinopril. It could also be to the new pepper you used last night. We gave you a steroid shot today to help bring the swelling down. Take a daily allergy pill such as Allegra, Claritin, or Zyrtec for the next week. Use Benadryl as needed for itching or swelling. This should resolve over the next 2-3 days. Please call your primary care doctor to let them know what happened. If this becomes a recurrent problem, you will likely need to stop the lisinopril. If you develop swelling of your tongue or your throat or have trouble breathing, please go to the emergency room.

## 2015-09-22 IMAGING — CT CT HEAD W/O CM
1 series · 16 of 30 positions shown, 20 images · non-contrast
Comparison: None.

CLINICAL DATA: Syncope and dizziness

EXAM:
CT HEAD WITHOUT CONTRAST
TECHNIQUE: Contiguous axial images were obtained from the base of the skull
through the vertex without intravenous contrast.

[Series 2: head 5.0 h30s · axial · 0.43mm/px · z∈[-149,-14]mm · 16 of 30 slices shown, 20 images]
[im 2/30  brain]
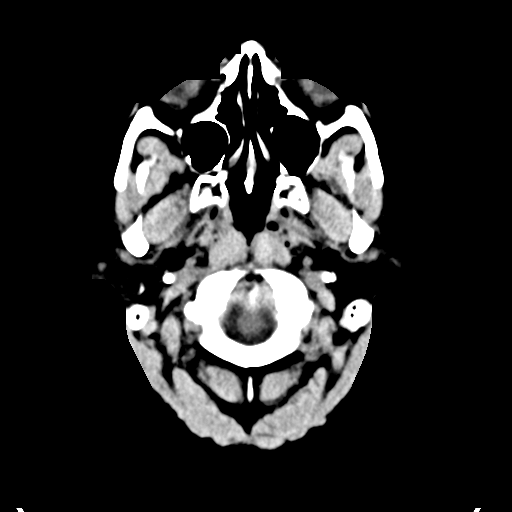
[im 2/30  bone]
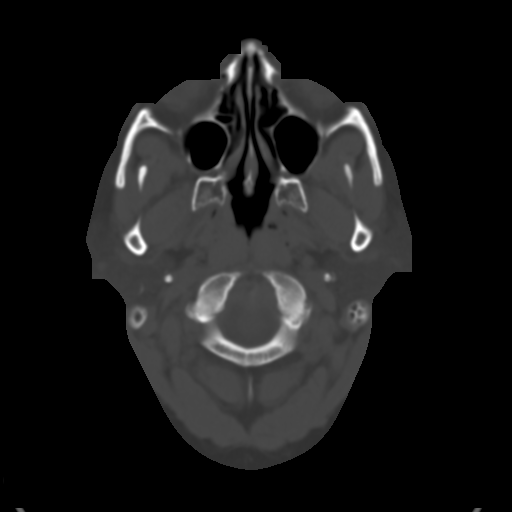
[im 4/30  brain]
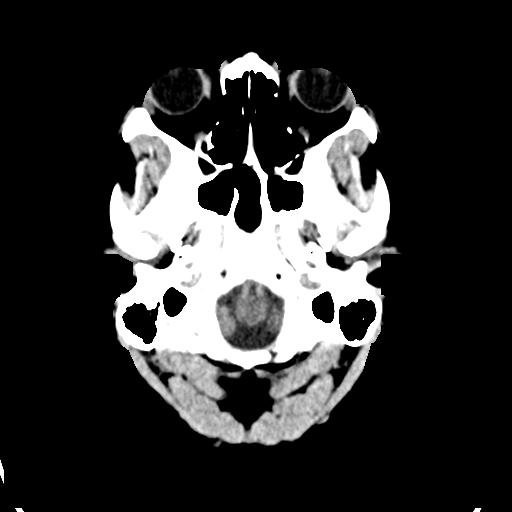
[im 6/30  brain]
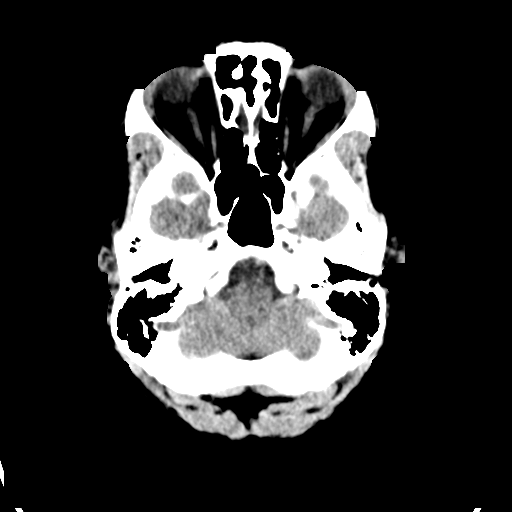
[im 8/30  brain]
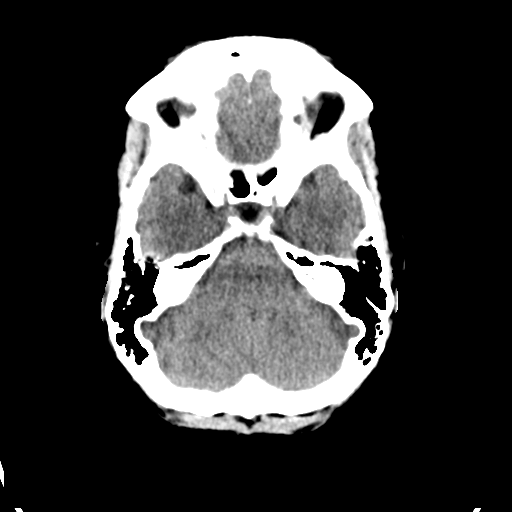
[im 9/30  brain]
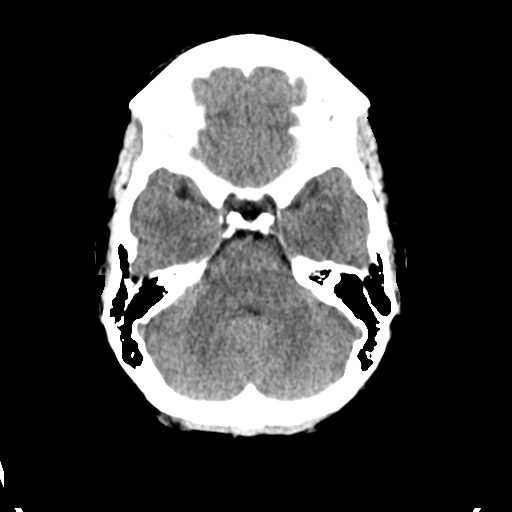
[im 9/30  bone]
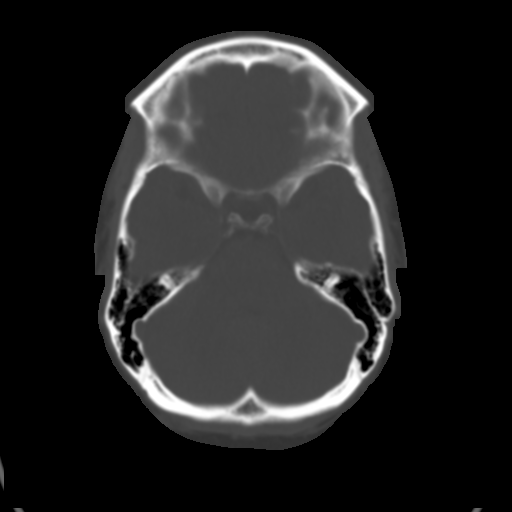
[im 11/30  brain]
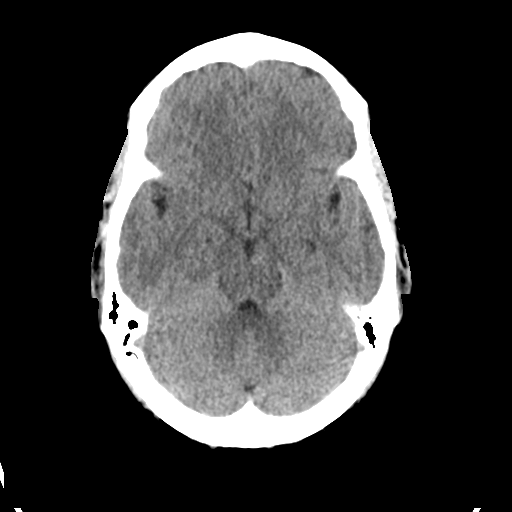
[im 13/30  brain]
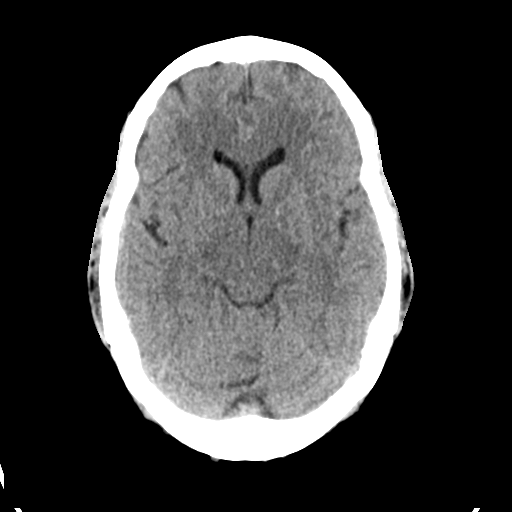
[im 15/30  brain]
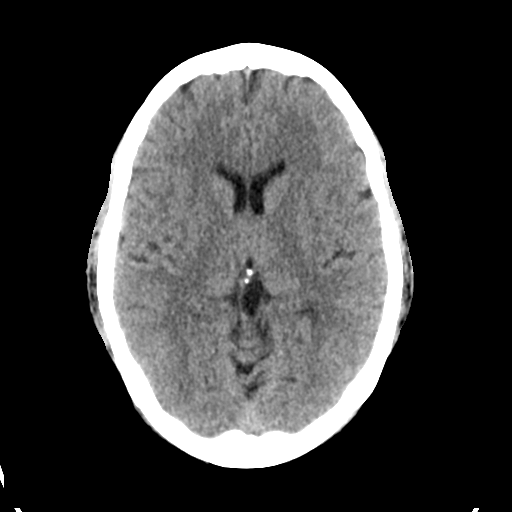
[im 16/30  brain]
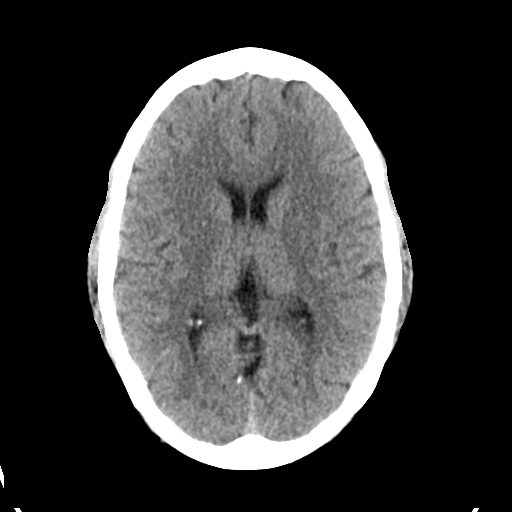
[im 16/30  bone]
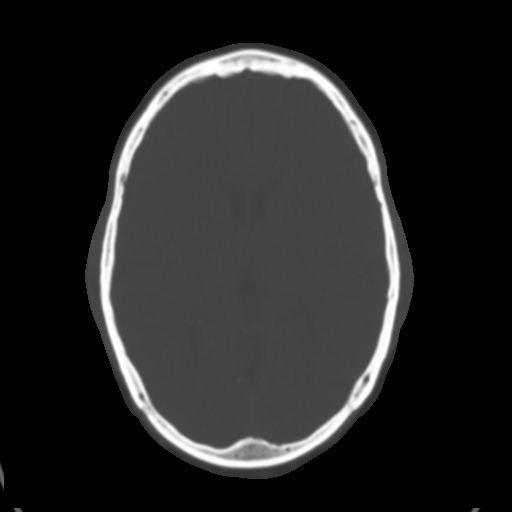
[im 18/30  brain]
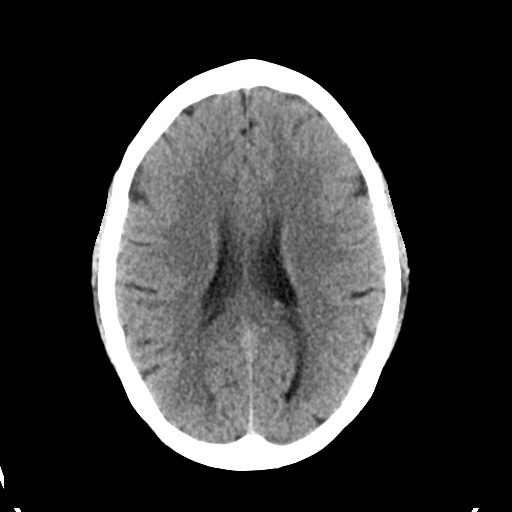
[im 20/30  brain]
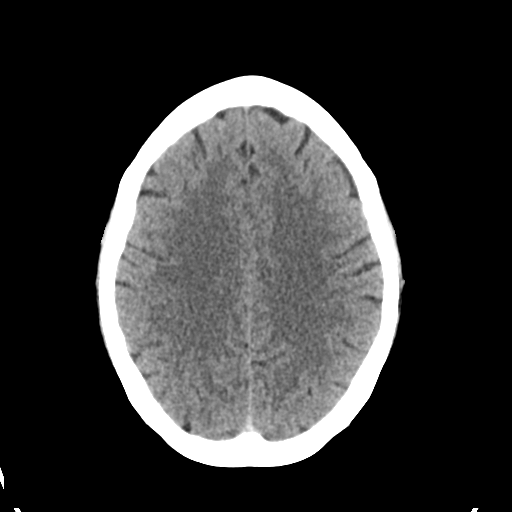
[im 22/30  brain]
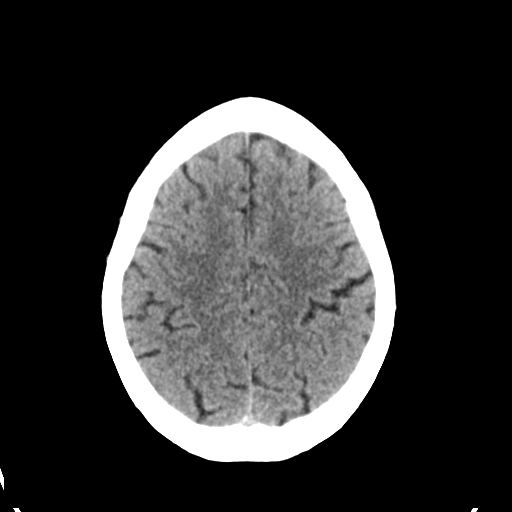
[im 23/30  brain]
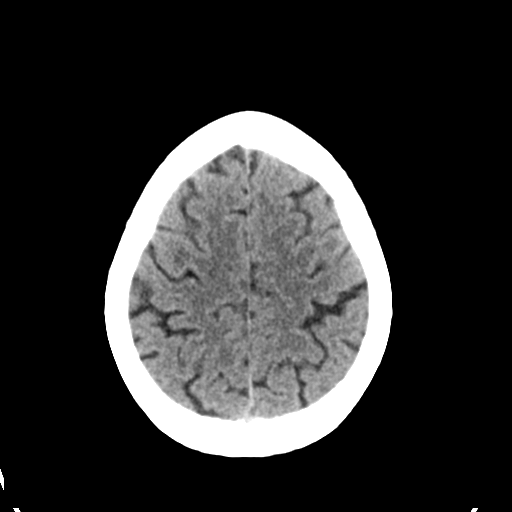
[im 23/30  bone]
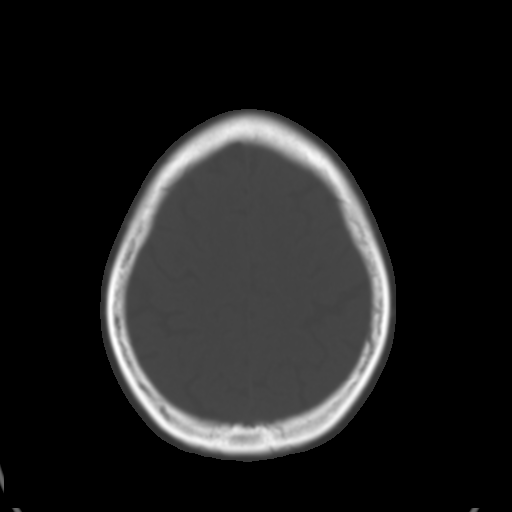
[im 25/30  brain]
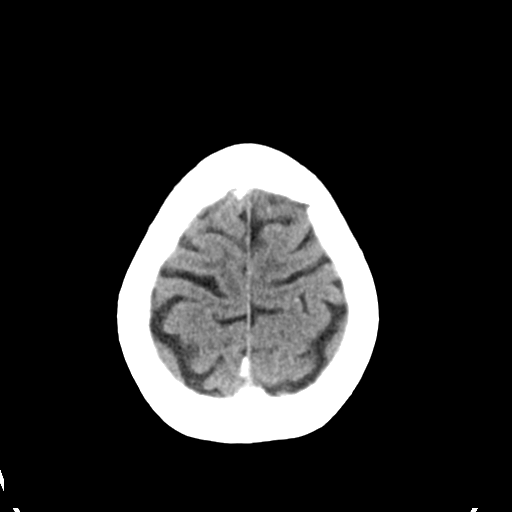
[im 27/30  brain]
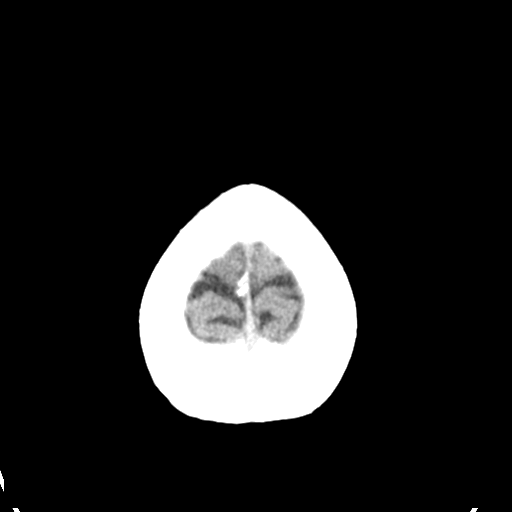
[im 29/30  brain]
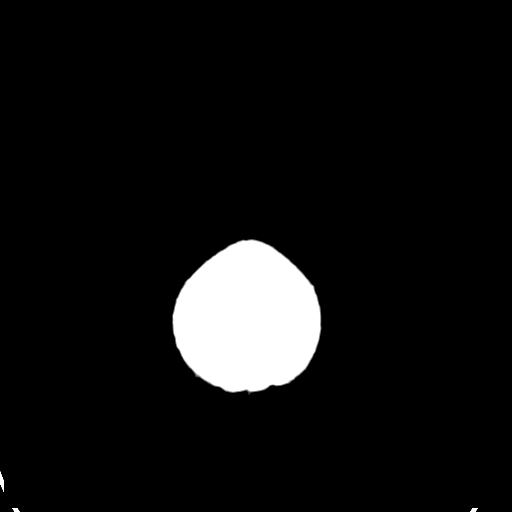

[16 of 30 positions shown; findings below may reference images not displayed]

FINDINGS: Skull and Sinuses:Negative for fracture or destructive process. The
mastoids, middle ears, and imaged paranasal sinuses are clear.

Orbits: No acute abnormality.

Brain: No evidence of acute infarction, hemorrhage, hydrocephalus,
or mass lesion/mass effect.
IMPRESSION: Negative head CT.

## 2015-09-30 ENCOUNTER — Other Ambulatory Visit: Payer: Self-pay | Admitting: Medical

## 2015-10-01 ENCOUNTER — Telehealth: Payer: Self-pay | Admitting: Medical

## 2015-10-01 MED ORDER — LISINOPRIL-HYDROCHLOROTHIAZIDE 20-12.5 MG PO TABS: 1.0000 | ORAL_TABLET | Freq: Every day | ORAL | Status: DC

## 2015-10-01 NOTE — Telephone Encounter (Signed)
rx renewed until appt. Trixie Rude/rlb

## 2015-10-01 NOTE — Telephone Encounter (Signed)
Pt made cpe appt for 7/20. Requesting refill on Lisinopril 20-12.5 to last until that appt

## 2015-10-24 DIAGNOSIS — R350 Frequency of micturition: Secondary | ICD-10-CM | POA: Diagnosis not present

## 2015-10-24 DIAGNOSIS — Z01419 Encounter for gynecological examination (general) (routine) without abnormal findings: Secondary | ICD-10-CM | POA: Diagnosis not present

## 2015-10-24 DIAGNOSIS — Z6837 Body mass index (BMI) 37.0-37.9, adult: Secondary | ICD-10-CM | POA: Diagnosis not present

## 2015-10-24 DIAGNOSIS — Z1231 Encounter for screening mammogram for malignant neoplasm of breast: Secondary | ICD-10-CM | POA: Diagnosis not present

## 2015-10-24 DIAGNOSIS — B373 Candidiasis of vulva and vagina: Secondary | ICD-10-CM | POA: Diagnosis not present

## 2015-10-24 DIAGNOSIS — N839 Noninflammatory disorder of ovary, fallopian tube and broad ligament, unspecified: Secondary | ICD-10-CM | POA: Diagnosis not present

## 2015-10-24 DIAGNOSIS — R109 Unspecified abdominal pain: Secondary | ICD-10-CM | POA: Diagnosis not present

## 2015-10-24 LAB — HM MAMMOGRAPHY: HM MAMMO: NORMAL (ref 0–4)

## 2015-10-25 ENCOUNTER — Ambulatory Visit (INDEPENDENT_AMBULATORY_CARE_PROVIDER_SITE_OTHER): Payer: BLUE CROSS/BLUE SHIELD | Admitting: Medical

## 2015-10-25 ENCOUNTER — Encounter: Payer: Self-pay | Admitting: Medical

## 2015-10-25 VITALS — BP 120/82 | HR 79 | Ht 67.0 in | Wt 230.0 lb

## 2015-10-25 DIAGNOSIS — R7301 Impaired fasting glucose: Secondary | ICD-10-CM

## 2015-10-25 DIAGNOSIS — Z8709 Personal history of other diseases of the respiratory system: Secondary | ICD-10-CM

## 2015-10-25 DIAGNOSIS — F419 Anxiety disorder, unspecified: Secondary | ICD-10-CM | POA: Diagnosis not present

## 2015-10-25 DIAGNOSIS — E559 Vitamin D deficiency, unspecified: Secondary | ICD-10-CM | POA: Diagnosis not present

## 2015-10-25 DIAGNOSIS — G5600 Carpal tunnel syndrome, unspecified upper limb: Secondary | ICD-10-CM

## 2015-10-25 DIAGNOSIS — Z0000 Encounter for general adult medical examination without abnormal findings: Secondary | ICD-10-CM | POA: Diagnosis not present

## 2015-10-25 DIAGNOSIS — I10 Essential (primary) hypertension: Secondary | ICD-10-CM | POA: Diagnosis not present

## 2015-10-25 DIAGNOSIS — R3129 Other microscopic hematuria: Secondary | ICD-10-CM | POA: Insufficient documentation

## 2015-10-25 DIAGNOSIS — Z1211 Encounter for screening for malignant neoplasm of colon: Secondary | ICD-10-CM

## 2015-10-25 DIAGNOSIS — E669 Obesity, unspecified: Secondary | ICD-10-CM | POA: Diagnosis not present

## 2015-10-25 DIAGNOSIS — M1000 Idiopathic gout, unspecified site: Secondary | ICD-10-CM

## 2015-10-25 DIAGNOSIS — M1990 Unspecified osteoarthritis, unspecified site: Secondary | ICD-10-CM

## 2015-10-25 DIAGNOSIS — N3281 Overactive bladder: Secondary | ICD-10-CM

## 2015-10-25 DIAGNOSIS — G243 Spasmodic torticollis: Secondary | ICD-10-CM | POA: Diagnosis not present

## 2015-10-25 HISTORY — DX: Personal history of other diseases of the respiratory system: Z87.09

## 2015-10-25 LAB — TSH: TSH: 0.65 m[IU]/L

## 2015-10-25 LAB — CBC WITH DIFFERENTIAL/PLATELET
BASOS ABS: 0 {cells}/uL (ref 0–200)
Basophils Relative: 0 %
EOS PCT: 1 %
Eosinophils Absolute: 54 cells/uL (ref 15–500)
HCT: 38.1 % (ref 35.0–45.0)
Hemoglobin: 13 g/dL (ref 11.7–15.5)
LYMPHS ABS: 2484 {cells}/uL (ref 850–3900)
LYMPHS PCT: 46 %
MCH: 28.4 pg (ref 27.0–33.0)
MCHC: 34.1 g/dL (ref 32.0–36.0)
MCV: 83.4 fL (ref 80.0–100.0)
MONOS PCT: 5 %
MPV: 9.8 fL (ref 7.5–12.5)
Monocytes Absolute: 270 cells/uL (ref 200–950)
NEUTROS PCT: 48 %
Neutro Abs: 2592 cells/uL (ref 1500–7800)
PLATELETS: 363 10*3/uL (ref 140–400)
RBC: 4.57 MIL/uL (ref 3.80–5.10)
RDW: 14 % (ref 11.0–15.0)
WBC: 5.4 10*3/uL (ref 4.0–10.5)

## 2015-10-25 LAB — COMPREHENSIVE METABOLIC PANEL
ALK PHOS: 64 U/L (ref 33–130)
ALT: 8 U/L (ref 6–29)
AST: 12 U/L (ref 10–35)
Albumin: 3.8 g/dL (ref 3.6–5.1)
BILIRUBIN TOTAL: 0.4 mg/dL (ref 0.2–1.2)
BUN: 9 mg/dL (ref 7–25)
CHLORIDE: 104 mmol/L (ref 98–110)
CO2: 27 mmol/L (ref 20–31)
CREATININE: 0.82 mg/dL (ref 0.50–1.05)
Calcium: 9.5 mg/dL (ref 8.6–10.4)
Glucose, Bld: 92 mg/dL (ref 65–99)
POTASSIUM: 4.2 mmol/L (ref 3.5–5.3)
Sodium: 142 mmol/L (ref 135–146)
TOTAL PROTEIN: 6.6 g/dL (ref 6.1–8.1)

## 2015-10-25 LAB — LIPID PANEL
CHOLESTEROL: 126 mg/dL (ref 125–200)
HDL: 56 mg/dL (ref >=46)
LDL Cholesterol: 55 mg/dL (ref <130)
TRIGLYCERIDES: 75 mg/dL (ref <150)
Total CHOL/HDL Ratio: 2.3 Ratio (ref <=5.0)
VLDL: 15 mg/dL (ref <30)

## 2015-10-25 LAB — URIC ACID: Uric Acid, Serum: 5 mg/dL (ref 2.5–7.0)

## 2015-10-25 NOTE — Progress Notes (Signed)
Subjective:   HPI  Kimberly Underwood is a 50 y.o. female who presents for a complete physical.  Medical care team includes:  Eye doctor  Dentist  Podiatry, Dr. Cristie Hem  Dr. Cherly Hensen, OB/Gyn Stephine Langbehn [facility], PA-C here for primary care   Concerns: none  lately getting some upper abdominal pain, no constipation, no diarrhea, no nausea, no blood in stool.   Uses no diet discretion, not exercising.  She saw gynecology yesterday and got a lecture on her weight, so she knows she needs to lose weight.  Reviewed their medical, surgical, family, social, medication, and allergy history and updated chart as appropriate.  Past Medical History  Diagnosis Date  . CARPAL TUNNEL SYNDROME, BILATERAL 05/22/2010  . HYPERTENSION 10/25/2008  . Dental caries   . Gout   . Arthritis 2014  . Wears glasses   . Overactive bladder   . History of echocardiogram 07/17/14    TTE, normal LV function, 55-60% EF  . Normal cardiac stress test 07/17/14    Dr. Peter [place]and  . History of EKG 07/2014    PVCs, otherwise normal  . History of nuclear stress test 07/2014    normal  . History of CT scan of head 07/2014    due to syncope, normal scan  . Anxiety   . Vitamin D deficiency   . Impaired fasting blood sugar     Past Surgical History  Procedure Laterality Date  . Wisdom tooth extraction  ~ 1985  . Toenail excision Bilateral 2000's    "big toes" (12/23/2012)  . Abdominal hysterectomy  09/2010    fibroid, partial hysterectomy     Social History   Social History  . Marital Status: Single    Spouse Name: N/A  . Number of Children: N/A  . Years of Education: N/A   Occupational History  . Quality Control   . 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 Smoking status: Never Smoker   . Smokeless tobacco: Never Used  . Alcohol Use: No  . Drug Use: No  . Sexual Activity: Yes   Other Topics Concern  . Not on file   Social History Narrative   Single, 2 children, ages 33yo  son, daughter age 27yo, exercise - none.  Works in Theatre stage manager at [employer].   As of 10/2015    Family History  Problem Relation Age of Onset  . Hypertension Father   . Gout Father   . Clotting disorder Father   . Lung disease Father     listeria  . Stroke Father   . Cancer Maternal Grandfather     lung cancer (smoking)  . Cancer Mother     breast cancer  . Migraines Sister   . Hypertension Sister   . Heart disease Daughter     heart murmur  . Diabetes Neg Hx   . Stroke Maternal Grandmother      Current outpatient prescriptions:  .  lisinopril-hydrochlorothiazide (PRINZIDE,ZESTORETIC) 20-12.5 MG tablet, Take 1 tablet by mouth daily., Disp: 90 tablet, Rfl: 0 .  albuterol (PROVENTIL HFA;VENTOLIN HFA) 108 (90 BASE) MCG/ACT inhaler, Inhale 2 puffs into the lungs every 6 (six) hours as needed for wheezing or shortness of breath. Reported on 10/25/2015, Disp: , Rfl:   No Known Allergies   Review of Systems Constitutional: -fever, -chills, -sweats, -unexpected weight change, -decreased appetite, -fatigue Allergy: -sneezing, -itching, -congestion Dermatology: -changing moles, --rash, -lumps ENT: -runny nose, -ear pain, -sore throat, -  hoarseness, -sinus pain, -teeth pain, - ringing in ears, -hearing loss, -nosebleeds Cardiology: -chest pain, -palpitations, -swelling, -difficulty breathing when lying flat, -waking up short of breath Respiratory: -cough, -shortness of breath, -difficulty breathing with exercise or exertion, -wheezing, -coughing up blood Gastroenterology: +abdominal pain, -nausea, -vomiting, -diarrhea, -constipation, -blood in stool, -changes in bowel movement, -difficulty swallowing or eating Hematology: -bleeding, -bruising  Musculoskeletal: -joint aches, -muscle aches, -joint swelling, -back pain, -neck pain, -cramping, -changes in gait Ophthalmology: denies vision changes, eye redness, itching, discharge Urology: -burning with urination, -difficulty  urinating, -blood in urine, -urinary frequency, -urgency, -incontinence Neurology: -headache, -weakness, -tingling, -numbness, -memory loss, -falls, -dizziness Psychology: -depressed mood, -agitation, -sleep problems     Objective:   Physical Exam  BP 120/82 mmHg  Pulse 79  Ht  (1.702 m)  Wt 230 lb (104.327 kg)  BMI 36.01 kg/m2  General appearance: alert, no distress, WD/WN, AA female Skin: few scatted macules and skin tags of neck, upper back, no worrisome lesions HEENT: normocephalic, conjunctiva/corneas normal, sclerae anicteric, PERRLA, EOMi, nares patent, no discharge or erythema, pharynx normal Oral cavity: MMM, tongue normal, teeth - missing some teeth, right posterior molar fractured,, moderate stain Neck: supple, no lymphadenopathy, no thyromegaly, no masses, normal ROM Chest: non tender, normal shape and expansion Heart: RRR, normal S1, S2, no murmurs Lungs: CTA bilaterally, no wheezes, rhonchi, or rales Abdomen: +bs, soft, non tender, non distended, no masses, no hepatomegaly, no splenomegaly, no bruits Back: non tender, normal ROM, no scoliosis Musculoskeletal: upper extremities non tender, no obvious deformity, normal ROM throughout, lower extremities non tender, no obvious deformity, normal ROM throughout Extremities: no edema, no cyanosis, no clubbing Pulses: 2+ symmetric, upper and lower extremities, normal cap refill Neurological: cervical dystonia noted, somewhat tremor like movements of neck unchanged, otherwise alert, oriented x 3, CN2-12 intact, strength normal upper extremities and lower extremities, sensation normal throughout, DTRs 2+ throughout, no cerebellar signs, gait normal Psychiatric: normal affect, behavior normal, pleasant  Breast/gyn/rectal - deferred to gyn    Assessment and Plan :    Encounter Diagnoses  Name Primary?  . Encounter for health maintenance examination in adult Yes  . Essential hypertension, benign   . Obesity (BMI 30-39.9)    . Idiopathic gout, unspecified chronicity, unspecified site   . Anxiety   . Overactive bladder   . Arthritis   . Carpal tunnel syndrome, unspecified laterality   . Cervical dystonia   . Microscopic hematuria   . History of bronchitis   . Vitamin D deficiency   . Impaired fasting blood sugar   . Special screening for malignant neoplasms, colon    Physical exam - discussed healthy lifestyle, diet, exercise, preventative care, vaccinations, and addressed their concerns.  Handout given. See your eye doctor yearly for routine vision care. See your dentist yearly for routine dental care including hygiene visits twice yearly See your gynecologist yearly for routine gynecological care. Referral to GI for colonoscopy and epigastric pain Work on improving exercise, diet, and weight loss efforts Other listed diagnosis - carpal tunnel, syncope, gout, arthritis, syncope all not currently active or problematic  Follow-up pending labs   Nurah was seen today for annual exam.  Diagnoses and all orders for this visit:  Encounter for health maintenance examination in adult -     Hemoglobin A1c -     Microalbumin / creatinine urine ratio -     TSH -     Comprehensive metabolic panel -     CBC with Differential/Platelet -  Lipid panel -     Uric acid -     VITAMIN D 25 Hydroxy (Vit-D Deficiency, Fractures) -     Ambulatory referral to Gastroenterology  Essential hypertension, benign -     Microalbumin / creatinine urine ratio -     TSH -     Comprehensive metabolic panel -     Lipid panel  Obesity (BMI 30-39.9)  Idiopathic gout, unspecified chronicity, unspecified site -     Uric acid  Anxiety  Overactive bladder  Arthritis  Carpal tunnel syndrome, unspecified laterality  Cervical dystonia  Microscopic hematuria  History of bronchitis  Vitamin D deficiency -     VITAMIN D 25 Hydroxy (Vit-D Deficiency, Fractures)  Impaired fasting blood sugar -     Hemoglobin A1c -      Lipid panel  Special screening for malignant neoplasms, colon -     Ambulatory referral to Gastroenterology

## 2015-10-26 LAB — VITAMIN D 25 HYDROXY (VIT D DEFICIENCY, FRACTURES): VIT D 25 HYDROXY: 16 ng/mL — AB (ref 30–100)

## 2015-10-26 LAB — HEMOGLOBIN A1C
Hgb A1c MFr Bld: 5.9 % — ABNORMAL HIGH (ref <5.7)
MEAN PLASMA GLUCOSE: 123 mg/dL

## 2015-10-26 LAB — MICROALBUMIN / CREATININE URINE RATIO
CREATININE, URINE: 244 mg/dL (ref 20–320)
Microalb Creat Ratio: 2 mcg/mg creat (ref <30)
Microalb, Ur: 0.6 mg/dL

## 2015-10-29 ENCOUNTER — Other Ambulatory Visit: Payer: Self-pay | Admitting: Medical

## 2015-10-29 ENCOUNTER — Encounter: Payer: Self-pay | Admitting: Gastroenterology

## 2015-10-29 MED ORDER — LISINOPRIL-HYDROCHLOROTHIAZIDE 20-12.5 MG PO TABS: 1.0000 | ORAL_TABLET | Freq: Every day | ORAL | 3 refills | Status: DC

## 2015-10-29 MED ORDER — ALBUTEROL SULFATE HFA 108 (90 BASE) MCG/ACT IN AERS: 2.0000 | INHALATION_SPRAY | Freq: Four times a day (QID) | RESPIRATORY_TRACT | 1 refills | Status: DC | PRN

## 2015-10-29 MED ORDER — VITAMIN D (ERGOCALCIFEROL) 1.25 MG (50000 UNIT) PO CAPS: 50000.0000 [IU] | ORAL_CAPSULE | ORAL | 3 refills | Status: DC

## 2015-11-06 ENCOUNTER — Other Ambulatory Visit: Payer: Self-pay | Admitting: Gastroenterology

## 2015-11-06 DIAGNOSIS — R1011 Right upper quadrant pain: Secondary | ICD-10-CM

## 2015-11-06 DIAGNOSIS — R1033 Periumbilical pain: Secondary | ICD-10-CM | POA: Diagnosis not present

## 2015-11-06 DIAGNOSIS — Z1211 Encounter for screening for malignant neoplasm of colon: Secondary | ICD-10-CM | POA: Diagnosis not present

## 2015-11-08 ENCOUNTER — Encounter: Payer: Self-pay | Admitting: Medical

## 2015-11-21 DIAGNOSIS — R3121 Asymptomatic microscopic hematuria: Secondary | ICD-10-CM | POA: Diagnosis not present

## 2015-11-30 ENCOUNTER — Telehealth: Payer: Self-pay

## 2015-11-30 NOTE — Telephone Encounter (Signed)
error 

## 2015-12-05 DIAGNOSIS — Z1211 Encounter for screening for malignant neoplasm of colon: Secondary | ICD-10-CM | POA: Diagnosis not present

## 2015-12-05 LAB — HM COLONOSCOPY

## 2015-12-13 ENCOUNTER — Ambulatory Visit (HOSPITAL_COMMUNITY)
Admission: RE | Admit: 2015-12-13 | Discharge: 2015-12-13 | Disposition: A | Discharge status: Home / Self Care | Payer: BLUE CROSS/BLUE SHIELD | Source: Ambulatory Visit | Attending: Gastroenterology | Admitting: Gastroenterology

## 2015-12-13 ENCOUNTER — Encounter (HOSPITAL_COMMUNITY)
Admission: RE | Admit: 2015-12-13 | Discharge: 2015-12-13 | Disposition: A | Discharge status: Home / Self Care | Payer: BLUE CROSS/BLUE SHIELD | Source: Ambulatory Visit | Attending: Gastroenterology | Admitting: Gastroenterology

## 2015-12-13 DIAGNOSIS — R1011 Right upper quadrant pain: Secondary | ICD-10-CM

## 2015-12-13 DIAGNOSIS — R932 Abnormal findings on diagnostic imaging of liver and biliary tract: Secondary | ICD-10-CM | POA: Diagnosis not present

## 2015-12-13 DIAGNOSIS — R109 Unspecified abdominal pain: Secondary | ICD-10-CM | POA: Diagnosis not present

## 2015-12-13 MED ORDER — TECHNETIUM TC 99M MEBROFENIN IV KIT: 5.5000 | PACK | Freq: Once | INTRAVENOUS | Status: DC | PRN

## 2015-12-21 DIAGNOSIS — R8271 Bacteriuria: Secondary | ICD-10-CM | POA: Diagnosis not present

## 2016-01-01 ENCOUNTER — Ambulatory Visit (INDEPENDENT_AMBULATORY_CARE_PROVIDER_SITE_OTHER): Payer: Self-pay | Admitting: Gastroenterology

## 2016-01-01 ENCOUNTER — Encounter: Payer: Self-pay | Admitting: Gastroenterology

## 2016-01-01 NOTE — Progress Notes (Signed)
Had colonoscopy by Dr. Loreta AveMann last month and wants to have ongoing care with Dr. Loreta AveMann.  No history taken. No exam performed. No charge.

## 2016-01-02 DIAGNOSIS — Z23 Encounter for immunization: Secondary | ICD-10-CM | POA: Diagnosis not present

## 2016-02-22 DIAGNOSIS — R3121 Asymptomatic microscopic hematuria: Secondary | ICD-10-CM | POA: Diagnosis not present

## 2016-04-22 ENCOUNTER — Encounter: Payer: Self-pay | Admitting: Nurse Practitioner

## 2016-04-22 ENCOUNTER — Ambulatory Visit (INDEPENDENT_AMBULATORY_CARE_PROVIDER_SITE_OTHER): Payer: BLUE CROSS/BLUE SHIELD | Admitting: Nurse Practitioner

## 2016-04-22 ENCOUNTER — Telehealth: Payer: Self-pay | Admitting: Medical

## 2016-04-22 VITALS — BP 118/76 | HR 73 | Temp 98.1°F | Ht 67.0 in | Wt 236.0 lb

## 2016-04-22 DIAGNOSIS — I10 Essential (primary) hypertension: Secondary | ICD-10-CM | POA: Diagnosis not present

## 2016-04-22 DIAGNOSIS — R350 Frequency of micturition: Secondary | ICD-10-CM

## 2016-04-22 LAB — POCT URINALYSIS DIPSTICK
Bilirubin, UA: NEGATIVE
GLUCOSE UA: NEGATIVE
Ketones, UA: NEGATIVE
LEUKOCYTES UA: NEGATIVE
Nitrite, UA: NEGATIVE
PROTEIN UA: NEGATIVE
Spec Grav, UA: >=1.03
UROBILINOGEN UA: 0.2
pH, UA: 5

## 2016-04-22 NOTE — Progress Notes (Signed)
Pre visit review using our clinic review tool, if applicable. No additional management support is needed unless otherwise documented below in the visit note. 

## 2016-04-22 NOTE — Patient Instructions (Signed)
We need medical records from previous urologist and gastroenterologist.

## 2016-04-22 NOTE — Progress Notes (Signed)
Reviewed with patient in office. See office note

## 2016-04-22 NOTE — Progress Notes (Signed)
Subjective:  Patient ID: Kimberly Underwood, female    DOB: 04/05/1966  Age: 51 y.o. MRN: [id number]  CC: Establish Care (establish care/weakness frequent urinate,,headache yesterday. req note to go back to work since she left work yesterday for those symptoms. )  Urinary Frequency   This is a new problem. The current episode started yesterday. The problem occurs every urination. The problem has been unchanged. The pain is at a severity of 0/10. The patient is experiencing no pain. There has been no fever. She is not sexually active. There is no history of pyelonephritis. Associated symptoms include frequency. Pertinent negatives include no chills, discharge, hematuria, hesitancy, nausea, sweats or urgency.    she is unable to remember urologist, GYN and GI evaluation done recently.  Outpatient Medications Prior to Visit  Medication Sig Dispense Refill  . albuterol (PROVENTIL HFA;VENTOLIN HFA) 108 (90 Base) MCG/ACT inhaler Inhale 2 puffs into the lungs every 6 (six) hours as needed for wheezing or shortness of breath. Reported on 10/25/2015 18 g 1  . lisinopril-hydrochlorothiazide (PRINZIDE,ZESTORETIC) 20-12.5 MG tablet Take 1 tablet by mouth daily. 90 tablet 3  . Vitamin D, Ergocalciferol, (DRISDOL) 50000 units CAPS capsule Take 1 capsule (50,000 Units total) by mouth every 7 (seven) days. 12 capsule 3   No facility-administered medications prior to visit.     ROS See HPI  Objective:  BP 118/76   Pulse 73   Temp 98.1 F (36.7 C)   Ht 5\' 7"  (1.702 m)   Wt 236 lb (107 kg)   SpO2 98%   BMI 36.96 kg/m   BP Readings from Last 3 Encounters:  04/22/16 118/76  01/01/16 110/68  10/25/15 120/82    Wt Readings from Last 3 Encounters:  04/22/16 236 lb (107 kg)  01/01/16 230 lb 1 oz (104.4 kg)  10/25/15 230 lb (104.3 kg)    Physical Exam  Constitutional: She is oriented to person, place, and time. No distress.  HENT:  Right Ear: External ear normal.  Left Ear: External ear normal.    Nose: Nose normal.  Mouth/Throat: No oropharyngeal exudate.  Cardiovascular: Normal rate, regular rhythm and normal heart sounds.   Pulmonary/Chest: Effort normal and breath sounds normal. No respiratory distress.  Abdominal: Soft. Bowel sounds are normal. She exhibits no distension.  Musculoskeletal: Normal range of motion. She exhibits no edema.  Neurological: She is alert and oriented to person, place, and time.  Skin: Skin is warm and dry.  Vitals reviewed.   Lab Results  Component Value Date   WBC 5.4 10/25/2015   HGB 13.0 10/25/2015   HCT 38.1 10/25/2015   PLT 363 10/25/2015   GLUCOSE 92 10/25/2015   CHOL 126 10/25/2015   TRIG 75 10/25/2015   HDL 56 10/25/2015   LDLCALC 55 10/25/2015   ALT 8 10/25/2015   AST 12 10/25/2015   NA 142 10/25/2015   K 4.2 10/25/2015   CL 104 10/25/2015   CREATININE 0.82 10/25/2015   BUN 9 10/25/2015   CO2 27 10/25/2015   TSH 0.65 10/25/2015   INR 0.99 07/17/2014   HGBA1C 5.9 (H) 10/25/2015   MICROALBUR 0.6 10/25/2015    [place] Abdomen Complete  Result Date: 12/13/2015 CLINICAL DATA:  One year of right upper quadrant pain EXAM: ABDOMEN ULTRASOUND COMPLETE COMPARISON:  None in PACs FINDINGS: Gallbladder: No gallstones or wall thickening visualized. No sonographic Murphy sign noted by sonographer. Common bile duct: Diameter: 4.2 mm Liver: The hepatic echotexture is mildly increased diffusely. There is no  focal mass nor ductal dilation. IVC: No abnormality visualized. Pancreas: The pancreatic head and body are normal in appearance. The pancreatic tail was obscured by bowel gas. Spleen: Size and appearance within normal limits. Right Kidney: Length: 12.0 cm. Echogenicity within normal limits. No mass or hydronephrosis visualized. Left Kidney: Length: 12.1 cm. Echogenicity within normal limits. No mass or hydronephrosis visualized. Abdominal aorta: No aneurysm visualized. Other findings: There is no ascites. IMPRESSION: 1. No gallstones or sonographic  evidence of acute cholecystitis. If there are clinical concerns of chronic cholecystitis, a nuclear medicine hepatobiliary scan may be useful. 2. Increased hepatic echotexture likely reflects fatty infiltration. 3. No acute abnormality observed elsewhere within the abdomen. Electronically Signed   By: David  Swaziland M.D.   On: 12/13/2015 08:35   Nm Hepato W/eject Fract  Result Date: 12/13/2015 CLINICAL DATA:  Chronic abdominal pain EXAM: NUCLEAR MEDICINE HEPATOBILIARY IMAGING WITH GALLBLADDER EF Views: Anterior right upper quadrant RADIOPHARMACEUTICALS:  5.5 mCi Tc-30m  Choletec IV COMPARISON:  None. FINDINGS: Liver uptake of radiotracer is normal. There is prompt visualization of gallbladder and small bowel, indicating patency of the cystic and common bile ducts. The patient consumed 8 ounces of Ensure orally with calculation of the computer generated ejection fraction of radiotracer from the gallbladder. The patient did not experience clinical symptoms with the oral Ensure consumption. The computer generated ejection fraction of radiotracer from the gallbladder is normal at 74%, normal greater than 33% using the oral agent. IMPRESSION: Study within normal limits. Electronically Signed   By: Bretta Bang III M.D.   On: 12/13/2015 11:36    Assessment & Plan:   Kimberly Underwood was seen today for establish care.  Diagnoses and all orders for this visit:  Increased frequency of urination -     POCT urinalysis dipstick  Essential hypertension, benign   I am having Kimberly Underwood maintain her lisinopril-hydrochlorothiazide, albuterol, and Vitamin D (Ergocalciferol).  No orders of the defined types were placed in this encounter.   Follow-up: Return in about 3 months (around 07/21/2016) for HTN and vitamin D re eval.  Kimberly Penna, NP

## 2016-04-23 NOTE — Telephone Encounter (Signed)
errorr

## 2016-05-18 ENCOUNTER — Encounter (HOSPITAL_COMMUNITY): Payer: Self-pay | Admitting: Emergency Medicine

## 2016-05-18 ENCOUNTER — Ambulatory Visit (HOSPITAL_COMMUNITY)
Admission: EM | Admit: 2016-05-18 | Discharge: 2016-05-18 | Disposition: A | Discharge status: Home / Self Care | Payer: BLUE CROSS/BLUE SHIELD

## 2016-05-18 DIAGNOSIS — K529 Noninfective gastroenteritis and colitis, unspecified: Secondary | ICD-10-CM

## 2016-05-18 MED ORDER — PROMETHAZINE HCL 25 MG PO TABS: 25.0000 mg | ORAL_TABLET | Freq: Four times a day (QID) | ORAL | 0 refills | Status: DC | PRN

## 2016-05-18 NOTE — ED Provider Notes (Signed)
CSN: [id number]     Arrival date & time 05/18/16  1202 History   First MD Initiated Contact with Patient 05/18/16 1240     Chief Complaint  Patient presents with  . URI   (Consider location/radiation/quality/duration/timing/severity/associated sxs/prior Treatment)  HPI   Patient is a 51 year old female presenting today with complaints of generalized malaise, fever, with nausea, vomiting, and a "little bit" diarrhea since this past Wednesday. Patient denies generalized body aches, headache, sore throat, cough and congestion. Patient's medical history significant for hypertension for which the patient states she is taking her lisinopril daily. Denies any allergies to medications. Patient states past 2 days were "worse", but that she's feeling "a little better" today.  She states she came to be seen today she is expected to be at work tomorrow.   Past Medical History:  Diagnosis Date  . Anxiety   . Arthritis 2014  . CARPAL TUNNEL SYNDROME, BILATERAL 05/22/2010  . Dental caries   . Gout   . History of CT scan of head 07/2014   due to syncope, normal scan  . History of echocardiogram 07/17/14   TTE, normal LV function, 55-60% EF  . History of EKG 07/2014   PVCs, otherwise normal  . History of nuclear stress test 07/2014   normal  . HYPERTENSION 10/25/2008  . Impaired fasting blood sugar   . Normal cardiac stress test 07/17/14   Dr. Peter SwazilandJordan  . Overactive bladder   . Vitamin D deficiency   . Wears glasses    Past Surgical History:  Procedure Laterality Date  . ABDOMINAL HYSTERECTOMY  09/2010   fibroid, partial hysterectomy   . TOENAIL EXCISION Bilateral 2000's   "big toes" (12/23/2012)  . WISDOM TOOTH EXTRACTION  ~ 1985   Family History  Problem Relation Age of Onset  . Hypertension Father   . Gout Father   . Clotting disorder Father   . Lung disease Father     listeria  . Stroke Father   . Arthritis Father   . Heart disease Father   . Cancer Mother     breast cancer  .  Migraines Sister   . Hypertension Sister   . Mental illness Sister   . Stroke Maternal Grandmother   . Cancer Maternal Grandfather     lung cancer (smoking)  . Heart disease Daughter     heart murmur  . Diabetes Neg Hx    Social History  Substance Use Topics  . Smoking status: Never Smoker  . Smokeless tobacco: Never Used  . Alcohol use Yes     Comment: social   OB History    No data available     Review of Systems  Constitutional: Positive for appetite change, chills and fever. Negative for fatigue.  HENT: Negative.   Eyes: Negative.  Negative for visual disturbance.  Respiratory: Negative.  Negative for cough and shortness of breath.   Cardiovascular: Negative.  Negative for chest pain and leg swelling.  Gastrointestinal: Positive for diarrhea, nausea and vomiting. Negative for abdominal distention, abdominal pain and constipation.  Endocrine: Negative.   Genitourinary: Negative.  Negative for flank pain, frequency, hematuria and urgency.  Musculoskeletal: Negative.  Negative for gait problem and neck stiffness.  Skin: Negative.  Negative for rash.  Allergic/Immunologic: Negative.   Neurological: Negative.  Negative for dizziness and headaches.  Hematological: Negative.   Psychiatric/Behavioral: Negative.  Negative for decreased concentration (.medsthis) and dysphoric mood (.medsthis).    Allergies  Patient has no known allergies.  Home Medications    Meds Ordered and Administered this Visit  Medications - No data to display  BP 135/85 (BP Location: Left Arm)   Pulse 92   Temp 98.6 F (37 C) (Oral)   Resp 17   SpO2 100%  No data found.   Physical Exam  Constitutional: She appears well-developed and well-nourished. No distress.  Eyes: Conjunctivae are normal. Pupils are equal, round, and reactive to light. Right eye exhibits no discharge. Left eye exhibits no discharge. No scleral icterus.  Neck: Normal range of motion. Neck supple. No thyromegaly present.   Cardiovascular: Normal rate, regular rhythm, normal heart sounds and intact distal pulses.  Exam reveals no gallop and no friction rub.   No murmur heard. Pulmonary/Chest: Effort normal and breath sounds normal. No respiratory distress. She has no wheezes. She has no rales. She exhibits no tenderness.  Abdominal: Soft. She exhibits no distension and no mass. There is no hepatosplenomegaly. There is no tenderness. There is no rigidity, no rebound, no guarding, no CVA tenderness, no tenderness at McBurney's point and negative Murphy's sign. No hernia. Hernia confirmed negative in the right inguinal area and confirmed negative in the left inguinal area.  Hyperactive bowel sounds in bilateral lower quadrants.   Skin: Skin is warm and dry. No rash noted. She is not diaphoretic.  Nursing note and vitals reviewed.   Urgent Care Course     Procedures (including critical care time)  Labs Review Labs Reviewed - No data to display  Imaging Review No results found.    MDM   1. Gastroenteritis    Meds ordered this encounter  Medications  . LISINOPRIL PO    Sig: Take by mouth.  . promethazine (PHENERGAN) 25 MG tablet    Sig: Take 1 tablet (25 mg total) by mouth every 6 (six) hours as needed for nausea or vomiting.    Dispense:  30 tablet    Refill:  0   The usual and customary discharge instructions and warnings were given.  The patient verbalizes understanding and agrees to plan of care.       Servando Salina, NP 05/18/16 1401

## 2016-05-18 NOTE — ED Triage Notes (Signed)
See s/s, fever,nausea and vomiting  Patient also shares concern for right lower leg pain, lateral lower leg.  No known injury.  Onset started when uri symptoms started.

## 2016-05-18 NOTE — Discharge Instructions (Signed)
You most likely have a viral stomach illness. Use the Phenergan as needed for nausea and vomiting. Make sure that you are starting off slowly with ice chips and water,toast, applesauce, bananas or mild things like chicken broth. The phenergan will make you drowsy.  Do not drive or operate a vehicle while using.

## 2016-07-07 ENCOUNTER — Ambulatory Visit (HOSPITAL_COMMUNITY)
Admission: EM | Admit: 2016-07-07 | Discharge: 2016-07-07 | Disposition: A | Discharge status: Home / Self Care | Payer: BLUE CROSS/BLUE SHIELD | Attending: Family Medicine | Admitting: Family Medicine

## 2016-07-07 ENCOUNTER — Encounter (HOSPITAL_COMMUNITY): Payer: Self-pay | Admitting: Emergency Medicine

## 2016-07-07 DIAGNOSIS — M25562 Pain in left knee: Secondary | ICD-10-CM

## 2016-07-07 MED ORDER — DICLOFENAC SODIUM 75 MG PO TBEC: 75.0000 mg | DELAYED_RELEASE_TABLET | Freq: Two times a day (BID) | ORAL | 1 refills | Status: DC

## 2016-07-07 NOTE — ED Triage Notes (Signed)
The patient presented to the [facility] with a complaint of left knee pain x 2 weeks . The patient denied any known injury.

## 2016-07-07 NOTE — ED Provider Notes (Signed)
[facility]    CSN: [id number] Arrival date & time: 07/07/16  1511     History   Chief Complaint Chief Complaint  Patient presents with  . Knee Pain    HPI BAYLEIGH LOFLIN is a 51 y.o. female.   The patient presented to the [facility] with a complaint of left knee pain x 2 weeks . The patient denied any known injury.  She has had a sitting job but starting last week, she changed her job. The pain began before she changed her job. Now she is required to stand quite a bit. Her graft patient denies any other joint pain or rash. She says she does not have any other history of left knee injury or pain.  Pain is minimal when she's sitting with weightbearing and bending the knee seems to make it worse. She has not tried any medication yet      Past Medical History:  Diagnosis Date  . Anxiety   . Arthritis 2014  . CARPAL TUNNEL SYNDROME, BILATERAL 05/22/2010  . Dental caries   . Gout   . History of CT scan of head 07/2014   due to syncope, normal scan  . History of echocardiogram 07/17/14   TTE, normal LV function, 55-60% EF  . History of EKG 07/2014   PVCs, otherwise normal  . History of nuclear stress test 07/2014   normal  . HYPERTENSION 10/25/2008  . Impaired fasting blood sugar   . Normal cardiac stress test 07/17/14   Dr. Peter [place]  . Overactive bladder   . Vitamin D deficiency   . Wears glasses     Patient Active Problem List   Diagnosis Date Noted  . Encounter for health maintenance examination in adult 10/25/2015  . Microscopic hematuria 10/25/2015  . History of bronchitis 10/25/2015  . Vitamin D deficiency 10/25/2015  . Overactive bladder 09/26/2014  . Anxiety 09/26/2014  . Arthritis   . Gout   . Essential hypertension, benign 12/27/2013  . Obesity (BMI 30-39.9) 12/27/2013  . Cervical dystonia 11/15/2012  . CARPAL TUNNEL SYNDROME, BILATERAL 05/22/2010    Past Surgical History:  Procedure Laterality Date  . ABDOMINAL HYSTERECTOMY  09/2010   fibroid,  partial hysterectomy   . TOENAIL EXCISION Bilateral 2000's   "big toes" (12/23/2012)  . WISDOM TOOTH EXTRACTION  ~ 1985    OB History    No data available       Home Medications    Prior to Admission medications   Medication Sig Start Date End Date Taking? Authorizing Provider  LISINOPRIL PO Take by mouth.   Yes Historical Provider, MD  Vitamin D, Ergocalciferol, (DRISDOL) 50000 units CAPS capsule Take 1 capsule (50,000 Units total) by mouth every 7 (seven) days. 10/29/15  Yes Kermit Balo Tysinger, PA-C  diclofenac (VOLTAREN) 75 MG EC tablet Take 1 tablet (75 mg total) by mouth 2 (two) times daily. 07/07/16   Elvina Sidle, MD    Family History Family History  Problem Relation Age of Onset  . Hypertension Father   . Gout Father   . Clotting disorder Father   . Lung disease Father     listeria  . Stroke Father   . Arthritis Father   . Heart disease Father   . Cancer Mother     breast cancer  . Migraines Sister   . Hypertension Sister   . Mental illness Sister   . Stroke Maternal Grandmother   . Cancer Maternal Grandfather     lung  cancer (smoking)  . Heart disease Daughter     heart murmur  . Diabetes Neg Hx     Social History Social History  Substance Use Topics  . Smoking status: Never Smoker  . Smokeless tobacco: Never Used  . Alcohol use Yes     Comment: social     Allergies   Patient has no known allergies.   Review of Systems Review of Systems  Musculoskeletal: Positive for gait problem and joint swelling.  All other systems reviewed and are negative.    Physical Exam Triage Vital Signs ED Triage Vitals [07/07/16 1520]  Enc Vitals Group     BP 128/74     Pulse Rate 87     Resp 14     Temp 98.4 F (36.9 C)     Temp Source Oral     SpO2 96 %     Weight      Height      Head Circumference      Peak Flow      Pain Score 6     Pain Loc      Pain Edu?      Excl. in GC?    No data found.   Updated Vital Signs BP 128/74 (BP Location:  Right Arm)   Pulse 87   Temp 98.4 F (36.9 C) (Oral)   Resp 14   SpO2 96%    Physical Exam  Constitutional: She is oriented to person, place, and time. She appears well-developed and well-nourished.  HENT:  Right Ear: External ear normal.  Left Ear: External ear normal.  Mouth/Throat: Oropharynx is clear and moist.  Eyes: Conjunctivae and EOM are normal. Pupils are equal, round, and reactive to light.  Neck: Normal range of motion. Neck supple.  Pulmonary/Chest: Effort normal.  Musculoskeletal: Normal range of motion. She exhibits tenderness.  The left knee is mildly tender with palpation around the anterior joint line on both sides. There is mild synovial thickening no definite effusion. Overlying skin is normal  Neurological: She is alert and oriented to person, place, and time.  Skin: Skin is warm and dry.  Nursing note and vitals reviewed.    UC Treatments / Results  Labs (all labs ordered are listed, but only abnormal results are displayed) Labs Reviewed - No data to display  EKG  EKG Interpretation None       Radiology No results found.  Procedures Procedures (including critical care time)  Medications Ordered in UC Medications - No data to display   Initial Impression / Assessment and Plan / UC Course  I have reviewed the triage vital signs and the nursing notes.  Pertinent labs & imaging results that were available during my care of the patient were reviewed by me and considered in my medical decision making (see chart for details).     Final Clinical Impressions(s) / UC Diagnoses   Final diagnoses:  Acute pain of left knee    New Prescriptions New Prescriptions   DICLOFENAC (VOLTAREN) 75 MG EC TABLET    Take 1 tablet (75 mg total) by mouth 2 (two) times daily.  Return if pain is not improved in a couple days. At that point we wait need to do an x-ray.   Elvina Sidle, MD 07/07/16 1539

## 2016-09-12 ENCOUNTER — Encounter: Payer: Self-pay | Admitting: Nurse Practitioner

## 2016-09-12 ENCOUNTER — Ambulatory Visit (INDEPENDENT_AMBULATORY_CARE_PROVIDER_SITE_OTHER): Payer: BLUE CROSS/BLUE SHIELD | Admitting: Nurse Practitioner

## 2016-09-12 VITALS — BP 116/76 | HR 78 | Temp 98.5°F | Ht 67.0 in | Wt 247.0 lb

## 2016-09-12 DIAGNOSIS — M545 Low back pain, unspecified: Secondary | ICD-10-CM

## 2016-09-12 LAB — POCT URINALYSIS DIPSTICK
Bilirubin, UA: NEGATIVE
GLUCOSE UA: NEGATIVE
Ketones, UA: NEGATIVE
Leukocytes, UA: NEGATIVE
NITRITE UA: NEGATIVE
PH UA: 6 (ref 5.0–8.0)
PROTEIN UA: NEGATIVE
RBC UA: NEGATIVE
SPEC GRAV UA: 1.025 (ref 1.010–1.025)
UROBILINOGEN UA: 0.2 U/dL

## 2016-09-12 MED ORDER — NAPROXEN 500 MG PO TABS: 500.0000 mg | ORAL_TABLET | Freq: Two times a day (BID) | ORAL | 0 refills | Status: DC | PRN

## 2016-09-12 MED ORDER — KETOROLAC TROMETHAMINE 30 MG/ML IJ SOLN
30.0000 mg | Freq: Once | INTRAMUSCULAR | Status: AC
  Administered 2016-09-12: 30 mg via INTRAMUSCULAR

## 2016-09-12 MED ORDER — METHOCARBAMOL 500 MG PO TABS: 500.0000 mg | ORAL_TABLET | Freq: Three times a day (TID) | ORAL | 0 refills | Status: DC | PRN

## 2016-09-12 NOTE — Progress Notes (Signed)
Subjective:  Patient ID: Kimberly Underwood, female    DOB: [birth year]  Age: 51 y.o. MRN: [id number]  CC: Pain (LLQ abdominal pain, painful when walk and breath 4 days. )   Back Pain  This is a new problem. The current episode started in the past 7 days. The problem occurs intermittently. The problem is unchanged. The pain is present in the lumbar spine. The quality of the pain is described as aching and cramping. The pain does not radiate. The symptoms are aggravated by twisting and bending. Pertinent negatives include no abdominal pain, bladder incontinence, bowel incontinence, chest pain, dysuria, fever, headaches, leg pain, numbness, paresis, paresthesias, pelvic pain, perianal numbness, tingling, weakness or weight loss. Risk factors include poor posture and obesity. She has tried nothing for the symptoms.    Outpatient Medications Prior to Visit  Medication Sig Dispense Refill  . Vitamin D, Ergocalciferol, (DRISDOL) 50000 units CAPS capsule Take 1 capsule (50,000 Units total) by mouth every 7 (seven) days. 12 capsule 3  . LISINOPRIL PO Take by mouth.    . diclofenac (VOLTAREN) 75 MG EC tablet Take 1 tablet (75 mg total) by mouth 2 (two) times daily. (Patient not taking: Reported on 09/12/2016) 14 tablet 1   No facility-administered medications prior to visit.     ROS See HPI  Objective:  BP 116/76   Pulse 78   Temp 98.5 F (36.9 C)   Ht 5\' 7"  (1.702 m)   Wt 247 lb (112 kg)   SpO2 97%   BMI 38.69 kg/m   BP Readings from Last 3 Encounters:  09/12/16 116/76  07/07/16 128/74  05/18/16 135/85    Wt Readings from Last 3 Encounters:  09/12/16 247 lb (112 kg)  04/22/16 236 lb (107 kg)  01/01/16 230 lb 1 oz (104.4 kg)    Physical Exam  Constitutional: She is oriented to person, place, and time.  Cardiovascular: Normal rate and regular rhythm.   Pulmonary/Chest: Effort normal and breath sounds normal.  Abdominal: Soft. Bowel sounds are normal. She exhibits no distension.  There is no tenderness. There is no rebound and no guarding.  Musculoskeletal: Normal range of motion. She exhibits tenderness. She exhibits no edema.       Lumbar back: She exhibits tenderness, pain and spasm. She exhibits normal range of motion, no bony tenderness and no edema.  Left lumbar paraspinal muscle tenderness.  Neurological: She is alert and oriented to person, place, and time.  Skin: Skin is warm and dry.  Vitals reviewed.   Lab Results  Component Value Date   WBC 5.4 10/25/2015   HGB 13.0 10/25/2015   HCT 38.1 10/25/2015   PLT 363 10/25/2015   GLUCOSE 92 10/25/2015   CHOL 126 10/25/2015   TRIG 75 10/25/2015   HDL 56 10/25/2015   LDLCALC 55 10/25/2015   ALT 8 10/25/2015   AST 12 10/25/2015   NA 142 10/25/2015   K 4.2 10/25/2015   CL 104 10/25/2015   CREATININE 0.82 10/25/2015   BUN 9 10/25/2015   CO2 27 10/25/2015   TSH 0.65 10/25/2015   INR 0.99 07/17/2014   HGBA1C 5.9 (H) 10/25/2015   MICROALBUR 0.6 10/25/2015    No results found.  Assessment & Plan:   Kimberly Underwood was seen today for pain.  Diagnoses and all orders for this visit:  Acute left-sided low back pain without sciatica -     POCT Urinalysis Dipstick -     ketorolac (TORADOL) 30 MG/ML injection 30  mg; Inject 1 mL (30 mg total) into the muscle once. -     methocarbamol (ROBAXIN) 500 MG tablet; Take 1 tablet (500 mg total) by mouth every 8 (eight) hours as needed for muscle spasms. -     naproxen (NAPROSYN) 500 MG tablet; Take 1 tablet (500 mg total) by mouth 2 (two) times daily as needed (for pain, take with food).   I have discontinued Ms. Heffernan's diclofenac. I am also having her start on methocarbamol and naproxen. Additionally, I am having her maintain her Vitamin D (Ergocalciferol), LISINOPRIL PO, and lisinopril-hydrochlorothiazide. We administered ketorolac.  Meds ordered this encounter  Medications  . lisinopril-hydrochlorothiazide (PRINZIDE,ZESTORETIC) 20-12.5 MG tablet    Sig: Take 1  tablet by mouth daily.  Marland Kitchen ketorolac (TORADOL) 30 MG/ML injection 30 mg  . methocarbamol (ROBAXIN) 500 MG tablet    Sig: Take 1 tablet (500 mg total) by mouth every 8 (eight) hours as needed for muscle spasms.    Dispense:  21 tablet    Refill:  0    Order Specific Question:   Supervising Provider    Answer:   Tresa Garter [[id number]]  . naproxen (NAPROSYN) 500 MG tablet    Sig: Take 1 tablet (500 mg total) by mouth 2 (two) times daily as needed (for pain, take with food).    Dispense:  20 tablet    Refill:  0    Order Specific Question:   Supervising Provider    Answer:   Tresa Garter [[id number]]    Follow-up: Return if symptoms worsen or fail to improve.  Alysia Penna, NP

## 2016-09-12 NOTE — Patient Instructions (Addendum)
Alternate between warm and cool compress as needed  Back Pain, Adult Back pain is very common. The pain often gets better over time. The cause of back pain is usually not dangerous. Most people can learn to manage their back pain on their own. Follow these instructions at home: Watch your back pain for any changes. The following actions may help to lessen any pain you are feeling:  Stay active. Start with short walks on flat ground if you can. Try to walk farther each day.  Exercise regularly as told by your doctor. Exercise helps your back heal faster. It also helps avoid future injury by keeping your muscles strong and flexible.  Do not sit, drive, or stand in one place for more than 30 minutes.  Do not stay in bed. Resting more than 1-2 days can slow down your recovery.  Be careful when you bend or lift an object. Use good form when lifting: ? Bend at your knees. ? Keep the object close to your body. ? Do not twist.  Sleep on a firm mattress. Lie on your side, and bend your knees. If you lie on your back, put a pillow under your knees.  Take medicines only as told by your doctor.  Put ice on the injured area. ? Put ice in a plastic bag. ? Place a towel between your skin and the bag. ? Leave the ice on for 20 minutes, 2-3 times a day for the first 2-3 days. After that, you can switch between ice and heat packs.  Avoid feeling anxious or stressed. Find good ways to deal with stress, such as exercise.  Maintain a healthy weight. Extra weight puts stress on your back.  Contact a doctor if:  You have pain that does not go away with rest or medicine.  You have worsening pain that goes down into your legs or buttocks.  You have pain that does not get better in one week.  You have pain at night.  You lose weight.  You have a fever or chills. Get help right away if:  You cannot control when you poop (bowel movement) or pee (urinate).  Your arms or legs feel weak.  Your  arms or legs lose feeling (numbness).  You feel sick to your stomach (nauseous) or throw up (vomit).  You have belly (abdominal) pain.  You feel like you may pass out (faint). This information is not intended to replace advice given to you by your health care provider. Make sure you discuss any questions you have with your health care provider. Document Released: 09/10/2007 Document Revised: 08/30/2015 Document Reviewed: 07/26/2013 Elsevier Interactive Patient Education  2018 [employer]   Back Exercises If you have pain in your back, do these exercises 2-3 times each day or as told by your doctor. When the pain goes away, do the exercises once each day, but repeat the steps more times for each exercise (do more repetitions). If you do not have pain in your back, do these exercises once each day or as told by your doctor. Exercises Single Knee to Chest  Do these steps 3-5 times in a row for each leg: 1. Lie on your back on a firm bed or the floor with your legs stretched out. 2. Bring one knee to your chest. 3. Hold your knee to your chest by grabbing your knee or thigh. 4. Pull on your knee until you feel a gentle stretch in your lower back. 5. Keep doing the stretch  for 10-30 seconds. 6. Slowly let go of your leg and straighten it.  Pelvic Tilt  Do these steps 5-10 times in a row: 1. Lie on your back on a firm bed or the floor with your legs stretched out. 2. Bend your knees so they point up to the ceiling. Your feet should be flat on the floor. 3. Tighten your lower belly (abdomen) muscles to press your lower back against the floor. This will make your tailbone point up to the ceiling instead of pointing down to your feet or the floor. 4. Stay in this position for 5-10 seconds while you gently tighten your muscles and breathe evenly.  Cat-Cow  Do these steps until your lower back bends more easily: 1. Get on your hands and knees on a firm surface. Keep your hands under your  shoulders, and keep your knees under your hips. You may put padding under your knees. 2. Let your head hang down, and make your tailbone point down to the floor so your lower back is round like the back of a cat. 3. Stay in this position for 5 seconds. 4. Slowly lift your head and make your tailbone point up to the ceiling so your back hangs low (sags) like the back of a cow. 5. Stay in this position for 5 seconds.  Press-Ups  Do these steps 5-10 times in a row: 1. Lie on your belly (face-down) on the floor. 2. Place your hands near your head, about shoulder-width apart. 3. While you keep your back relaxed and keep your hips on the floor, slowly straighten your arms to raise the top half of your body and lift your shoulders. Do not use your back muscles. To make yourself more comfortable, you may change where you place your hands. 4. Stay in this position for 5 seconds. 5. Slowly return to lying flat on the floor.  Bridges  Do these steps 10 times in a row: 1. Lie on your back on a firm surface. 2. Bend your knees so they point up to the ceiling. Your feet should be flat on the floor. 3. Tighten your butt muscles and lift your butt off of the floor until your waist is almost as high as your knees. If you do not feel the muscles working in your butt and the back of your thighs, slide your feet 1-2 inches farther away from your butt. 4. Stay in this position for 3-5 seconds. 5. Slowly lower your butt to the floor, and let your butt muscles relax.  If this exercise is too easy, try doing it with your arms crossed over your chest. Belly Crunches  Do these steps 5-10 times in a row: 1. Lie on your back on a firm bed or the floor with your legs stretched out. 2. Bend your knees so they point up to the ceiling. Your feet should be flat on the floor. 3. Cross your arms over your chest. 4. Tip your chin a little bit toward your chest but do not bend your neck. 5. Tighten your belly muscles and  slowly raise your chest just enough to lift your shoulder blades a tiny bit off of the floor. 6. Slowly lower your chest and your head to the floor.  Back Lifts Do these steps 5-10 times in a row: 1. Lie on your belly (face-down) with your arms at your sides, and rest your forehead on the floor. 2. Tighten the muscles in your legs and your butt. 3. Slowly lift  your chest off of the floor while you keep your hips on the floor. Keep the back of your head in line with the curve in your back. Look at the floor while you do this. 4. Stay in this position for 3-5 seconds. 5. Slowly lower your chest and your face to the floor.  Contact a doctor if:  Your back pain gets a lot worse when you do an exercise.  Your back pain does not lessen 2 hours after you exercise. If you have any of these problems, stop doing the exercises. Do not do them again unless your doctor says it is okay. Get help right away if:  You have sudden, very bad back pain. If this happens, stop doing the exercises. Do not do them again unless your doctor says it is okay. This information is not intended to replace advice given to you by your health care provider. Make sure you discuss any questions you have with your health care provider. Document Released: 04/26/2010 Document Revised: 08/30/2015 Document Reviewed: 05/18/2014 Elsevier Interactive Patient Education  [employer].

## 2016-10-16 ENCOUNTER — Other Ambulatory Visit: Payer: Self-pay | Admitting: Nurse Practitioner

## 2016-12-18 ENCOUNTER — Ambulatory Visit (INDEPENDENT_AMBULATORY_CARE_PROVIDER_SITE_OTHER): Payer: BLUE CROSS/BLUE SHIELD | Admitting: Nurse Practitioner

## 2016-12-18 ENCOUNTER — Encounter: Payer: Self-pay | Admitting: Nurse Practitioner

## 2016-12-18 VITALS — BP 116/70 | HR 86 | Temp 98.3°F | Ht 67.0 in | Wt 253.0 lb

## 2016-12-18 DIAGNOSIS — M25462 Effusion, left knee: Secondary | ICD-10-CM | POA: Diagnosis not present

## 2016-12-18 DIAGNOSIS — M25562 Pain in left knee: Secondary | ICD-10-CM | POA: Diagnosis not present

## 2016-12-18 MED ORDER — KNEE COMPRESSION SLEEVE/L/XL MISC: 1.0000 [IU] | Freq: Every day | 0 refills | Status: DC

## 2016-12-18 MED ORDER — MELOXICAM 7.5 MG PO TABS: 7.5000 mg | ORAL_TABLET | Freq: Every day | ORAL | 0 refills | Status: DC

## 2016-12-18 NOTE — Progress Notes (Signed)
Subjective:  Patient ID: Kimberly Underwood, female    DOB: 04-19-1965  Age: 51 y.o. MRN: [id number]  CC: Leg Swelling (legs pain (left side worse)/ arms stiff at times/ going on 1 wk. )   Knee Pain   The incident occurred 5 to 7 days ago. There was no injury mechanism. The pain is present in the left knee. The quality of the pain is described as aching (tightness). The pain has been fluctuating since onset. Pertinent negatives include no inability to bear weight, loss of motion, loss of sensation, muscle weakness, numbness or tingling. Associated symptoms comments: Difficulty with flexion and extension of knee. She reports no foreign bodies present. The symptoms are aggravated by movement, weight bearing and palpation. She has tried nothing for the symptoms.    Outpatient Medications Prior to Visit  Medication Sig Dispense Refill  . lisinopril-hydrochlorothiazide (PRINZIDE,ZESTORETIC) 20-12.5 MG tablet Take 1 tablet by mouth daily.    . Vitamin D, Ergocalciferol, (DRISDOL) 50000 units CAPS capsule Take 1 capsule (50,000 Units total) by mouth once a week. Need to see PCP for future refills. 12 capsule 0  . LISINOPRIL PO Take by mouth.    . methocarbamol (ROBAXIN) 500 MG tablet Take 1 tablet (500 mg total) by mouth every 8 (eight) hours as needed for muscle spasms. (Patient not taking: Reported on 12/18/2016) 21 tablet 0  . naproxen (NAPROSYN) 500 MG tablet Take 1 tablet (500 mg total) by mouth 2 (two) times daily as needed (for pain, take with food). (Patient not taking: Reported on 12/18/2016) 20 tablet 0   No facility-administered medications prior to visit.     ROS See HPI  Objective:  BP 116/70   Pulse 86   Temp 98.3 F (36.8 C)   Ht 5\' 7"  (1.702 m)   Wt 253 lb (114.8 kg)   SpO2 97%   BMI 39.63 kg/m   BP Readings from Last 3 Encounters:  12/18/16 116/70  09/12/16 116/76  07/07/16 128/74    Wt Readings from Last 3 Encounters:  12/18/16 253 lb (114.8 kg)  09/12/16 247 lb (112  kg)  04/22/16 236 lb (107 kg)    Physical Exam  Constitutional: She is oriented to person, place, and time. No distress.  Neck: Normal range of motion. Neck supple.  Cardiovascular: Normal rate.   Pulmonary/Chest: Effort normal.  Musculoskeletal: She exhibits no edema, tenderness or deformity.       Right elbow: Normal.      Right wrist: Normal.       Right hip: Normal.       Left hip: Normal.       Right knee: Normal.       Left knee: She exhibits decreased range of motion and effusion. She exhibits no deformity, no erythema, normal alignment, no LCL laxity, normal patellar mobility and no MCL laxity. No medial joint line, no lateral joint line, no MCL, no LCL and no patellar tendon tenderness noted.       Right ankle: Normal.       Left ankle: Normal.       Right upper arm: Normal.       Right forearm: Normal.       Right hand: Normal.  Diffuse anterior knee tenderness  Neurological: She is alert and oriented to person, place, and time.  Skin: Skin is warm and dry.  Psychiatric: She has a normal mood and affect. Her behavior is normal.  Vitals reviewed.   Lab Results  Component  Value Date   WBC 5.4 10/25/2015   HGB 13.0 10/25/2015   HCT 38.1 10/25/2015   PLT 363 10/25/2015   GLUCOSE 92 10/25/2015   CHOL 126 10/25/2015   TRIG 75 10/25/2015   HDL 56 10/25/2015   LDLCALC 55 10/25/2015   ALT 8 10/25/2015   AST 12 10/25/2015   NA 142 10/25/2015   K 4.2 10/25/2015   CL 104 10/25/2015   CREATININE 0.82 10/25/2015   BUN 9 10/25/2015   CO2 27 10/25/2015   TSH 0.65 10/25/2015   INR 0.99 07/17/2014   HGBA1C 5.9 (H) 10/25/2015   MICROALBUR 0.6 10/25/2015    No results found.  Assessment & Plan:   Kimberly Underwood was seen today for leg swelling.  Diagnoses and all orders for this visit:  Pain and swelling of left knee -     meloxicam (MOBIC) 7.5 MG tablet; Take 1 tablet (7.5 mg total) by mouth daily. With food -     Elastic Bandages & Supports (KNEE COMPRESSION SLEEVE/L/XL)  MISC; 1 Units by Does not apply route daily.  She declined knee joint injection and aspiration.  I have discontinued Kimberly Underwood's methocarbamol and naproxen. I am also having her start on meloxicam and Knee Compression Sleeve/L/XL. Additionally, I am having her maintain her LISINOPRIL PO, lisinopril-hydrochlorothiazide, and Vitamin D (Ergocalciferol).  Meds ordered this encounter  Medications  . meloxicam (MOBIC) 7.5 MG tablet    Sig: Take 1 tablet (7.5 mg total) by mouth daily. With food    Dispense:  30 tablet    Refill:  0    Order Specific Question:   Supervising Provider    Answer:   Tresa Garter [[id number]]  . Elastic Bandages & Supports (KNEE COMPRESSION SLEEVE/L/XL) MISC    Sig: 1 Units by Does not apply route daily.    Dispense:  1 each    Refill:  0    Order Specific Question:   Supervising Provider    Answer:   Tresa Garter [[id number]]    Follow-up: Return if symptoms worsen or fail to improve.  Alysia Penna, NP

## 2016-12-18 NOTE — Patient Instructions (Signed)
Wear compression sleeve during the day and off at night.  Return to office if no improvement in 1week or if you change your mind about knee injection.   How to Use a Knee Immobilizer A knee immobilizer, also called a knee brace, is used to support and protect an injured or painful knee. You may also have to wear it after knee surgery. A knee brace keeps your knee from moving or bending while it is healing. Wear and remove your knee brace only as told by your doctor. In general, your knee brace should:  Have straps, hooks, or tapes that fasten snugly around your leg.  Not feel too tight or too loose.  Follow these instructions at home:  While resting, raise (elevate) your leg above the level of your heart. Pillows can be used for support. Doing this reduces throbbing and helps with healing.  Loosen the brace if your toes tingle or if you notice other symptoms or signs that it is too tight, such as: ? Swelling. ? Numbness. ? Color change in your foot or ankle. ? More pain.  Keep the brace clean.  If the brace is not waterproof: ? Do not let it get wet. ? Cover it with a watertight covering when you take a bath or a shower.  If your doctor says that you may take off (remove) the brace: ? Take it off before you take a bath or a shower. ? Check for any skin irritation. ? Use a towel to dry the area completely before you put the brace back on. Contact a doctor if:  Your knee brace breaks or needs to be replaced.  You have more pain or swelling in your knee, foot, or ankle.  Your knee brace is not helping.  Your knee brace makes your knee pain worse. Summary  A knee immobilizer, also called a knee brace, keeps your knee from moving or bending while it is healing.  Different knee braces will have different instructions for use. Follow the instructions from your doctor.  Call your doctor if you have more pain or swelling, if your knee brace breaks, or if it does not help your  knee pain. This information is not intended to replace advice given to you by your health care provider. Make sure you discuss any questions you have with your health care provider. Document Released: 01/01/2008 Document Revised: 03/10/2016 Document Reviewed: 03/10/2016 Elsevier Interactive Patient Education  2017 [employer]

## 2017-01-07 DIAGNOSIS — Z23 Encounter for immunization: Secondary | ICD-10-CM | POA: Diagnosis not present

## 2017-01-10 DIAGNOSIS — Z803 Family history of malignant neoplasm of breast: Secondary | ICD-10-CM | POA: Diagnosis not present

## 2017-01-10 DIAGNOSIS — Z1231 Encounter for screening mammogram for malignant neoplasm of breast: Secondary | ICD-10-CM | POA: Diagnosis not present

## 2017-01-10 LAB — HM MAMMOGRAPHY

## 2017-01-15 ENCOUNTER — Other Ambulatory Visit: Payer: Self-pay | Admitting: Nurse Practitioner

## 2017-01-15 ENCOUNTER — Encounter: Payer: Self-pay | Admitting: Nurse Practitioner

## 2017-01-15 NOTE — Telephone Encounter (Signed)
1 mo supply sent to pharmacy, please help call pt and offer an appt before she runs out.

## 2017-01-15 NOTE — Telephone Encounter (Signed)
Let pt know. She said that she would call to make the appointment closer to time.

## 2017-01-21 ENCOUNTER — Other Ambulatory Visit: Payer: Self-pay | Admitting: Medical

## 2017-01-23 ENCOUNTER — Other Ambulatory Visit: Payer: Self-pay | Admitting: Medical

## 2017-01-24 ENCOUNTER — Other Ambulatory Visit: Payer: Self-pay | Admitting: Medical

## 2017-01-28 ENCOUNTER — Telehealth: Payer: Self-pay | Admitting: Nurse Practitioner

## 2017-01-28 MED ORDER — LISINOPRIL-HYDROCHLOROTHIAZIDE 20-12.5 MG PO TABS: 1.0000 | ORAL_TABLET | Freq: Every day | ORAL | 0 refills | Status: DC

## 2017-01-28 NOTE — Telephone Encounter (Signed)
Pt is needing a refill on her lisinopril-hydrochlorothiazide (PRINZIDE,ZESTORETIC) 20-12.5 MG tablet [facility] [address]

## 2017-01-28 NOTE — Telephone Encounter (Signed)
[facility]. Pt last ov for hypertension was 04/2016 and suppose to follow up in April (saw charlotte 2 times since than but for an acute).   Claris GowerCharlotte, do you want to see her before we send in more?  Please help call pt and offer an appt with Claris Gowerharlotte before rx runs out.

## 2017-01-29 ENCOUNTER — Ambulatory Visit: Payer: BLUE CROSS/BLUE SHIELD | Admitting: Nurse Practitioner

## 2017-01-29 NOTE — Telephone Encounter (Signed)
Got patient scheduled

## 2017-01-29 NOTE — Telephone Encounter (Signed)
yesy she needs OV for any further refills

## 2017-02-16 ENCOUNTER — Other Ambulatory Visit (INDEPENDENT_AMBULATORY_CARE_PROVIDER_SITE_OTHER): Payer: BLUE CROSS/BLUE SHIELD

## 2017-02-16 ENCOUNTER — Encounter: Payer: Self-pay | Admitting: Nurse Practitioner

## 2017-02-16 ENCOUNTER — Ambulatory Visit (INDEPENDENT_AMBULATORY_CARE_PROVIDER_SITE_OTHER): Payer: BLUE CROSS/BLUE SHIELD | Admitting: Nurse Practitioner

## 2017-02-16 VITALS — BP 128/80 | HR 82 | Temp 98.0°F | Ht 67.0 in | Wt 250.0 lb

## 2017-02-16 DIAGNOSIS — I10 Essential (primary) hypertension: Secondary | ICD-10-CM | POA: Diagnosis not present

## 2017-02-16 DIAGNOSIS — E559 Vitamin D deficiency, unspecified: Secondary | ICD-10-CM | POA: Diagnosis not present

## 2017-02-16 DIAGNOSIS — M7711 Lateral epicondylitis, right elbow: Secondary | ICD-10-CM | POA: Diagnosis not present

## 2017-02-16 DIAGNOSIS — G8929 Other chronic pain: Secondary | ICD-10-CM

## 2017-02-16 DIAGNOSIS — M25562 Pain in left knee: Secondary | ICD-10-CM | POA: Diagnosis not present

## 2017-02-16 LAB — BASIC METABOLIC PANEL
BUN: 14 mg/dL (ref 6–23)
CO2: 29 mEq/L (ref 19–32)
Calcium: 9.8 mg/dL (ref 8.4–10.5)
Chloride: 102 mEq/L (ref 96–112)
Creatinine, Ser: 0.82 mg/dL (ref 0.40–1.20)
GFR: 94.25 mL/min (ref >60.00)
Glucose, Bld: 107 mg/dL — ABNORMAL HIGH (ref 70–99)
POTASSIUM: 3.6 meq/L (ref 3.5–5.1)
SODIUM: 139 meq/L (ref 135–145)

## 2017-02-16 LAB — VITAMIN D 25 HYDROXY (VIT D DEFICIENCY, FRACTURES): VITD: 36.76 ng/mL (ref 30.00–100.00)

## 2017-02-16 MED ORDER — DICLOFENAC SODIUM 2 % TD SOLN: 1.0000 "application " | Freq: Two times a day (BID) | TRANSDERMAL | 0 refills | Status: DC | PRN

## 2017-02-16 MED ORDER — LISINOPRIL-HYDROCHLOROTHIAZIDE 20-12.5 MG PO TABS: 1.0000 | ORAL_TABLET | Freq: Every day | ORAL | 1 refills | Status: DC

## 2017-02-16 MED ORDER — VITAMIN D (ERGOCALCIFEROL) 50 MCG (2000 UT) PO CAPS: 1.0000 | ORAL_CAPSULE | Freq: Every day | ORAL | 0 refills | Status: DC

## 2017-02-16 NOTE — Progress Notes (Signed)
Subjective:  Patient ID: Kimberly Underwood, female    DOB: 1966/02/13  Age: 51 y.o. MRN: [id number]  CC: Medication Refill (medication refills? vit d and BP med) and Extremity Weakness (stiff,sore, not full ROM--on right arm. )   HPI  HTN: Controlled with lisinopril/hctz. BP Readings from Last 3 Encounters:  02/16/17 128/80  12/18/16 116/70  09/12/16 116/76   Right Elbow Pain and left knee pain: Ongoing for several months. She is right hand dominant. Current job entails repetitive arm movement and lifting. Denies any injury. Intermittent swelling, no erythema. No paresthesia.  Outpatient Medications Prior to Visit  Medication Sig Dispense Refill  . Elastic Bandages & Supports (KNEE COMPRESSION SLEEVE/L/XL) MISC 1 Units by Does not apply route daily. 1 each 0  . lisinopril-hydrochlorothiazide (PRINZIDE,ZESTORETIC) 20-12.5 MG tablet Take 1 tablet by mouth daily. 30 tablet 0  . Vitamin D, Ergocalciferol, (DRISDOL) 50000 units CAPS capsule take 1 capsule by mouth EVERY WEEK, NEED TO SEE PCP FOR FUTURE REFILLS 4 capsule 0  . LISINOPRIL PO Take by mouth.    . meloxicam (MOBIC) 7.5 MG tablet Take 1 tablet (7.5 mg total) by mouth daily. With food (Patient not taking: Reported on 02/16/2017) 30 tablet 0   No facility-administered medications prior to visit.     ROS Review of Systems  Constitutional: Negative.   Respiratory: Negative for cough and shortness of breath.   Cardiovascular: Negative for chest pain and palpitations.  Musculoskeletal: Positive for joint pain. Negative for falls.    Objective:  BP 128/80   Pulse 82   Temp 98 F (36.7 C)   Ht 5\' 7"  (1.702 m)   Wt 250 lb (113.4 kg)   SpO2 98%   BMI 39.16 kg/m   BP Readings from Last 3 Encounters:  02/16/17 128/80  12/18/16 116/70  09/12/16 116/76    Wt Readings from Last 3 Encounters:  02/16/17 250 lb (113.4 kg)  12/18/16 253 lb (114.8 kg)  09/12/16 247 lb (112 kg)    Physical Exam  Constitutional: She is  oriented to person, place, and time. No distress.  Cardiovascular: Normal rate and regular rhythm.  Pulmonary/Chest: Effort normal and breath sounds normal.  Musculoskeletal: She exhibits tenderness. She exhibits no edema.  Neurological: She is alert and oriented to person, place, and time.  Skin: Skin is warm.  Vitals reviewed.   Lab Results  Component Value Date   WBC 5.4 10/25/2015   HGB 13.0 10/25/2015   HCT 38.1 10/25/2015   PLT 363 10/25/2015   GLUCOSE 107 (H) 02/16/2017   CHOL 126 10/25/2015   TRIG 75 10/25/2015   HDL 56 10/25/2015   LDLCALC 55 10/25/2015   ALT 8 10/25/2015   AST 12 10/25/2015   NA 139 02/16/2017   K 3.6 02/16/2017   CL 102 02/16/2017   CREATININE 0.82 02/16/2017   BUN 14 02/16/2017   CO2 29 02/16/2017   TSH 0.65 10/25/2015   INR 0.99 07/17/2014   HGBA1C 5.9 (H) 10/25/2015   MICROALBUR 0.6 10/25/2015    No results found.  Assessment & Plan:   Kimberly Underwood was seen today for medication refill and extremity weakness.  Diagnoses and all orders for this visit:  Essential hypertension, benign -     Basic metabolic panel; Future -     lisinopril-hydrochlorothiazide (PRINZIDE,ZESTORETIC) 20-12.5 MG tablet; Take 1 tablet daily by mouth.  Chronic pain of left knee -     Diclofenac Sodium (PENNSAID) 2 % SOLN; Place 1 application 2 (two)  times daily as needed onto the skin. -     Ambulatory referral to Sports Medicine  Vitamin D deficiency -     Vitamin D (25 hydroxy); Future -     Vitamin D, Ergocalciferol, 2000 units CAPS; Take 1 tablet at bedtime by mouth.  Lateral epicondylitis of right elbow -     Ambulatory referral to Sports Medicine   I have discontinued Kimberly Underwood's LISINOPRIL PO, meloxicam, and Vitamin D (Ergocalciferol). I have also changed her lisinopril-hydrochlorothiazide. Additionally, I am having her start on Diclofenac Sodium and Vitamin D (Ergocalciferol). Lastly, I am having her maintain her Knee Compression Sleeve/L/XL.  Meds  ordered this encounter  Medications  . Diclofenac Sodium (PENNSAID) 2 % SOLN    Sig: Place 1 application 2 (two) times daily as needed onto the skin.    Dispense:  112 g    Refill:  0    Order Specific Question:   Supervising Provider    Answer:   Tresa GarterPLOTNIKOV, ALEKSEI V [[id number]]  . lisinopril-hydrochlorothiazide (PRINZIDE,ZESTORETIC) 20-12.5 MG tablet    Sig: Take 1 tablet daily by mouth.    Dispense:  90 tablet    Refill:  1    Order Specific Question:   Supervising Provider    Answer:   Tresa GarterPLOTNIKOV, ALEKSEI V [[id number]]  . Vitamin D, Ergocalciferol, 2000 units CAPS    Sig: Take 1 tablet at bedtime by mouth.    Dispense:  90 capsule    Refill:  0    Order Specific Question:   Supervising Provider    Answer:   Tresa GarterPLOTNIKOV, ALEKSEI V [[id number]]    Follow-up: Return in about 6 months (around 08/16/2017) for with new pcp.Alysia Penna.  Charlotte Nche, NP

## 2017-02-16 NOTE — Patient Instructions (Addendum)
Stable renal function and improved vitamin D. Medication refill sent. Vitamin dose decreased to 2000IU once a day.  Use knee brace and elbow sleeve daily. Apply cold compress after elbow exercise.  Follow up with sports medicine. Tennis Elbow Rehab Ask your health care provider which exercises are safe for you. Do exercises exactly as told by your health care provider and adjust them as directed. It is normal to feel mild stretching, pulling, tightness, or discomfort as you do these exercises, but you should stop right away if you feel sudden pain or your pain gets worse. Do not begin these exercises until told by your health care provider. Stretching and range of motion exercises These exercises warm up your muscles and joints and improve the movement and flexibility of your elbow. These exercises also help to relieve pain, numbness, and tingling. Exercise A: Wrist extensor stretch 1. Extend your left / right elbow with your fingers pointing down. 2. Gently pull the palm of your left / right hand toward you until you feel a gentle stretch on the top of your forearm. 3. To increase the stretch, push your left / right hand toward the outer edge or pinkie side of your forearm. 4. Hold this position for __________ seconds. Repeat __________ times. Complete this exercise __________ times a day. If directed by your health care provider, repeat this stretch except do it with a bent elbow this time. Exercise B: Wrist flexor stretch  1. Extend your left / right elbow and turn your palm upward. 2. Gently pull your left / right palm and fingertips back so your wrist extends and your fingers point more toward the ground. 3. You should feel a gentle stretch on the inside of your forearm. 4. Hold this position for __________ seconds. Repeat __________ times. Complete this exercise __________ times a day. If directed by your health care provider, repeat this stretch except do it with a bent elbow this  time. Strengthening exercises These exercises build strength and endurance in your elbow. Endurance is the ability to use your muscles for a long time, even after they get tired. Exercise C: Wrist extensors  1. Sit with your left / right forearm palm-down and fully supported on a table or countertop. Your elbow should be resting below the height of your shoulder. 2. Let your left / right wrist extend over the edge of the surface. 3. Loosely hold a __________ weight or a piece of rubber exercise band or tubing in your left / right hand. Slowly curl your left / right hand up toward your forearm. If you are using band or tubing, hold the band or tubing in place with your other hand to provide resistance. 4. Hold this position for __________ seconds. 5. Slowly return to the starting position. Repeat __________ times. Complete this exercise __________ times a day. Exercise D: Radial deviators  1. Stand with a __________ weight in your left / righthand. Or, sit while holding a rubber exercise band or tubing with your other arm supported on a table or countertop. Position your hand so your thumb is on top. 2. Raise your hand upward in front of you so your thumb travels toward your forearm, or pull up on the rubber tubing. 3. Hold this position for __________ seconds. 4. Slowly return to the starting position. Repeat __________ times. Complete this exercise __________ times a day. Exercise E: Eccentric wrist extensors 1. Sit with your left / right forearm palm-down and fully supported on a table or countertop.  Your elbow should be resting below the height of your shoulder. 2. If told by your health care provider, hold a __________ weight in your hand. 3. Let your left / right wrist extend over the edge of the surface. 4. Use your other hand to lift up your left / right hand toward your forearm. Keep your forearm on the table. 5. Using only the muscles in your left / right hand, slowly lower your hand  back down to the starting position. Repeat __________ times. Complete this exercise __________ times a day. This information is not intended to replace advice given to you by your health care provider. Make sure you discuss any questions you have with your health care provider. Document Released: 03/24/2005 Document Revised: 11/28/2015 Document Reviewed: 12/21/2014 Elsevier Interactive Patient Education  [employer]2018 Elsevier Inc.

## 2017-02-23 DIAGNOSIS — R3121 Asymptomatic microscopic hematuria: Secondary | ICD-10-CM | POA: Diagnosis not present

## 2017-03-09 ENCOUNTER — Ambulatory Visit: Payer: Self-pay

## 2017-03-09 ENCOUNTER — Other Ambulatory Visit: Payer: Self-pay

## 2017-03-09 ENCOUNTER — Ambulatory Visit (INDEPENDENT_AMBULATORY_CARE_PROVIDER_SITE_OTHER): Payer: BLUE CROSS/BLUE SHIELD | Admitting: Family Medicine

## 2017-03-09 ENCOUNTER — Encounter: Payer: Self-pay | Admitting: Family Medicine

## 2017-03-09 VITALS — BP 104/70 | HR 90 | Ht 67.0 in | Wt 246.0 lb

## 2017-03-09 DIAGNOSIS — M170 Bilateral primary osteoarthritis of knee: Secondary | ICD-10-CM | POA: Insufficient documentation

## 2017-03-09 DIAGNOSIS — E559 Vitamin D deficiency, unspecified: Secondary | ICD-10-CM

## 2017-03-09 DIAGNOSIS — M7711 Lateral epicondylitis, right elbow: Secondary | ICD-10-CM | POA: Diagnosis not present

## 2017-03-09 DIAGNOSIS — M25521 Pain in right elbow: Secondary | ICD-10-CM

## 2017-03-09 HISTORY — DX: Lateral epicondylitis, right elbow: M77.11

## 2017-03-09 MED ORDER — DICLOFENAC SODIUM 2 % TD SOLN: 2.0000 g | Freq: Two times a day (BID) | TRANSDERMAL | 3 refills | Status: DC

## 2017-03-09 NOTE — Assessment & Plan Note (Signed)
Degenerative arthritis bilaterally.  Seems to be worse on the left and more patellofemoral.  Home exercises, discussed possible bracing, we discussed topical anti-inflammatories.  Patient will try some over-the-counter medications and started again on once weekly vitamin D with history of vitamin D deficiency.  We discussed which activities to do which wants to avoid.  Follow-up again with me 4 weeks

## 2017-03-09 NOTE — Assessment & Plan Note (Signed)
Lateral Epicondylitis: Elbow anatomy was reviewed, and tendinopathy was explained.  Pt. given a formal rehab program. Series of concentric and eccentric exercises should be done starting with no weight, work up to 1 lb, hammer, etc.  Use counterforce strap if working or using hands.  Formal PT would be beneficial. Emphasized stretching an cross-friction massage Emphasized proper palms up lifting biomechanics to unload ECRB  

## 2017-03-09 NOTE — Patient Instructions (Signed)
Good to see you  Ice 20 minutes 2 times daily. Usually after activity and before bed. Exercises 3 times a week.  pennsaid pinkie amount topically 2 times daily as needed.  Lift underhand or thumbs up  Continue the once weekly vitamin D for 12 weeks Over the counter get  Turmeric 500mg  daily  Tart cherry extract any dose at night See me again in 4 weeks

## 2017-03-09 NOTE — Progress Notes (Signed)
Kimberly ScaleZach Joal Underwood D.O. [facility] [address] [place] [address] Phone: [phone] Subjective:     CC: Knee pain and elbow pain  BJY:NWGNFAOZHYHPI:Subjective  Kimberly Underwood is a 51 y.o. female coming in with complaint of knee pain and elbow pain.  Regarding knee pain- Bilateral. Chronic. States sometimes the get inflamed. Standing for long periods of time.  Patient describes the pain as a dull, throbbing aching pain with sharp pain with certain movements.  Worse with going up or down stairs or standing a long amount of time.  Better with rest.  Denies any radiation of pain.  States that most the pain seems to be around the kneecap.  Does not remember any true injury.  Rates his severity pain is 7 out of 10   Regarding elbow pain- Right. Chronic. Lateral epi. Activity makes it worse. Says she can't hold it for a long time. Grip strength is weak at times. Ice makes it feel better.  Patient does not remember any injury.  Does actually sit at a desk and do a lot of typing.  Seems like when she is doing more typing seems to be worse.     Past Medical History:  Diagnosis Date  . Anxiety   . Arthritis 2014  . CARPAL TUNNEL SYNDROME, BILATERAL 05/22/2010  . Dental caries   . Gout   . History of CT scan of head 07/2014   due to syncope, normal scan  . History of echocardiogram 07/17/14   TTE, normal LV function, 55-60% EF  . History of EKG 07/2014   PVCs, otherwise normal  . History of nuclear stress test 07/2014   normal  . HYPERTENSION 10/25/2008  . Impaired fasting blood sugar   . Normal cardiac stress test 07/17/14   Dr. Peter SwazilandJordan  . Overactive bladder   . Vitamin D deficiency   . Wears glasses    Past Surgical History:  Procedure Laterality Date  . ABDOMINAL HYSTERECTOMY  09/2010   fibroid, partial hysterectomy   . TOENAIL EXCISION Bilateral 2000's   "big toes" (12/23/2012)  . WISDOM TOOTH EXTRACTION  ~ 1985   Social History   Socioeconomic History  . Marital status:  Single    Spouse name: Not on file  . Number of children: Not on file  . Years of education: Not on file  . Highest education level: Not on file  Social Needs  . Financial resource strain: Not on file  . Food insecurity - worry: Not on file  . Food insecurity - inability: Not on file  . Transportation needs - medical: Not on file  . Transportation needs - non-medical: Not on file  Occupational History  . Occupation: Systems analystQuality Control    Employer: [employer]  . 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 Smoking status: Never Smoker  . Smokeless tobacco: Never Used  Substance and Sexual Activity  . Alcohol use: Yes    Comment: social  . Drug use: No  . Sexual activity: Yes    Birth control/protection: Surgical  Other Topics Concern  . Not on file  Social History Narrative   Single, 2 children, ages 51yo son, daughter age 8925yo, exercise - none.  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   As of 10/2015   No Known Allergies Family History  Problem Relation Age of Onset  . Hypertension Father   . Gout Father   . Clotting disorder Father   . Lung disease  Father        listeria  . Stroke Father   . Arthritis Father   . Heart disease Father   . Cancer Mother        breast cancer  . Migraines Sister   . Hypertension Sister   . Mental illness Sister   . Stroke Maternal Grandmother   . Cancer Maternal Grandfather        lung cancer (smoking)  . Heart disease Daughter        heart murmur  . Diabetes Neg Hx      Past medical history, social, surgical and family history all reviewed in electronic medical record.  No pertanent information unless stated regarding to the chief complaint.   Review of Systems:Review of systems updated and as accurate as of 03/09/17  No headache, visual changes, nausea, vomiting, diarrhea, constipation, dizziness, abdominal pain, skin rash, fevers, chills, night sweats, weight loss, swollen lymph nodes, body aches, joint swelling,  chest pain, shortness of breath, mood changes.  Positive muscle aches  Objective  There were no vitals taken for this visit. Systems examined below as of 03/09/17   General: No apparent distress alert and oriented x3 mood and affect normal, dressed appropriately.  HEENT: Pupils equal, extraocular movements intact  Respiratory: Patient's speak in full sentences and does not appear short of breath  Cardiovascular: No lower extremity edema, non tender, no erythema  Skin: Warm dry intact with no signs of infection or rash on extremities or on axial skeleton.  Abdomen: Soft nontender  Neuro: Cranial nerves II through XII are intact, neurovascularly intact in all extremities with 2+ DTRs and 2+ pulses.  Lymph: No lymphadenopathy of posterior or anterior cervical chain or axillae bilaterally.  Gait normal with good balance and coordination.  MSK:  Non tender with full range of motion and good stability and symmetric strength and tone of shoulders, , wrist, hip, and ankles bilaterally.  Knee: Left Normal to inspection with no erythema or effusion or obvious bony abnormalities. Pain over the medial joint space more of the patellofemoral joint ROM full in flexion and extension and lower leg rotation. Ligaments with solid consistent endpoints including ACL, PCL, LCL, MCL. Negative Mcmurray's, Apley's, and Thessalonian tests. painful patellar compression. Patellar glide with moderate crepitus. Patellar and quadriceps tendons unremarkable. Hamstring and quadriceps strength is normal. Contralateral knee does have a positive patellar grind as well with some mild medial joint space but seems to be all ligaments intact Right elbow shows the patient does have pain with resisted extension of the wrist.  Pain over the lateral epicondylar region.  Full strength and full supination and pronation.  Neurovascularly intact with full strength.  MSK US performed of: Bilateral knee This study was ordered,  performed, and interpreted by Terrilee FilesZach Zalea Pete D.O.  Knee: Patient does have narrowing of the medial joint space as well as patellofemoral space bilaterally left greater than right.  Significant osteophyte formation.  Degenerative changes of the medial meniscus bilaterally  IMPRESSION: Generative arthritis of the knees  Procedure 97110; 15 additional minutes spent for Therapeutic exercises as stated in above notes.  This included exercises focusing on stretching, strengthening, with significant focus on eccentric aspects.   Long term goals include an improvement in range of motion, strength, endurance as well as avoiding reinjury. Patient's frequency would include in 1-2 times a day, 3-5 times a week for a duration of 6-12 weeks. Patellofemoral Syndrome  Reviewed anatomy using anatomical model and how PFS occurs.  Given rehab  exercises handout for VMO, hip abductors, core, entire kinetic chain including proprioception exercises including cone touches, step downs, hip elevations and turn outs.  Could benefit from PT, regular exercise, upright biking, and a PFS knee brace to assist with tracking abnormalities.  Proper technique shown and discussed handout in great detail with ATC.  All questions were discussed and answered.      Impression and Recommendations:     This case required medical decision making of moderate complexity.      Note: This dictation was prepared with Dragon dictation along with smaller phrase technology. Any transcriptional errors that result from this process are unintentional.

## 2017-03-09 NOTE — Assessment & Plan Note (Signed)
Once weekly vitamin D given. 

## 2017-05-04 ENCOUNTER — Other Ambulatory Visit: Payer: Self-pay | Admitting: *Deleted

## 2017-06-11 DIAGNOSIS — Z01411 Encounter for gynecological examination (general) (routine) with abnormal findings: Secondary | ICD-10-CM | POA: Diagnosis not present

## 2017-06-11 DIAGNOSIS — E669 Obesity, unspecified: Secondary | ICD-10-CM | POA: Diagnosis not present

## 2017-06-11 DIAGNOSIS — N898 Other specified noninflammatory disorders of vagina: Secondary | ICD-10-CM | POA: Diagnosis not present

## 2017-08-11 ENCOUNTER — Ambulatory Visit (INDEPENDENT_AMBULATORY_CARE_PROVIDER_SITE_OTHER): Payer: BLUE CROSS/BLUE SHIELD | Admitting: Nurse Practitioner

## 2017-08-11 ENCOUNTER — Encounter: Payer: Self-pay | Admitting: Nurse Practitioner

## 2017-08-11 VITALS — BP 106/74 | HR 87 | Temp 98.2°F | Ht 67.0 in | Wt 248.0 lb

## 2017-08-11 DIAGNOSIS — M25562 Pain in left knee: Secondary | ICD-10-CM | POA: Diagnosis not present

## 2017-08-11 DIAGNOSIS — M170 Bilateral primary osteoarthritis of knee: Secondary | ICD-10-CM | POA: Diagnosis not present

## 2017-08-11 DIAGNOSIS — G8929 Other chronic pain: Secondary | ICD-10-CM

## 2017-08-11 MED ORDER — METHYLPREDNISOLONE ACETATE 40 MG/ML IJ SUSP
40.0000 mg | Freq: Once | INTRAMUSCULAR | Status: AC
  Administered 2017-08-11: 40 mg via INTRA_ARTICULAR

## 2017-08-11 NOTE — Progress Notes (Signed)
Subjective:  Patient ID: Kimberly Underwood, female    DOB: Jan 29, 1966  Age: 52 y.o. MRN: [id number]  CC: Knee Pain (knee pain ans swelling,comes and goes/pop at times. )  Knee Pain   The incident occurred more than 1 week ago (ongoing for several years.). There was no injury mechanism. The pain is present in the left knee and right knee. The quality of the pain is described as aching. The pain is severe. The pain has been intermittent since onset. Pertinent negatives include no inability to bear weight, loss of motion, loss of sensation, muscle weakness, numbness or tingling. She reports no foreign bodies present. The symptoms are aggravated by movement and weight bearing. She has tried NSAIDs, non-weight bearing, immobilization, ice, heat and elevation for the symptoms. The treatment provided mild relief.   Recurrent knee pain and swelling  L >R. Minimal improvement with meloxicam and pennsaid gel.  Outpatient Medications Prior to Visit  Medication Sig Dispense Refill  . Diclofenac Sodium (PENNSAID) 2 % SOLN Place 2 g onto the skin 2 (two) times daily. 112 g 3  . Elastic Bandages & Supports (KNEE COMPRESSION SLEEVE/L/XL) MISC 1 Units by Does not apply route daily. 1 each 0  . lisinopril-hydrochlorothiazide (PRINZIDE,ZESTORETIC) 20-12.5 MG tablet Take 1 tablet daily by mouth. 90 tablet 1  . Vitamin D, Ergocalciferol, 2000 units CAPS Take 1 tablet at bedtime by mouth. 90 capsule 0  . Diclofenac Sodium (PENNSAID) 2 % SOLN Place 1 application 2 (two) times daily as needed onto the skin. (Patient not taking: Reported on 08/11/2017) 112 g 0   No facility-administered medications prior to visit.     ROS See HPI  Objective:  BP 106/74   Pulse 87   Temp 98.2 F (36.8 C) (Oral)   Ht  (1.702 m)   Wt 248 lb (112.5 kg)   SpO2 96%   BMI 38.84 kg/m   BP Readings from Last 3 Encounters:  08/11/17 106/74  03/09/17 104/70  02/16/17 128/80    Wt Readings from Last 3 Encounters:  08/11/17  248 lb (112.5 kg)  03/09/17 246 lb (111.6 kg)  02/16/17 250 lb (113.4 kg)    Physical Exam  Constitutional: She is oriented to person, place, and time. No distress.  Cardiovascular: Normal rate.  Pulmonary/Chest: Effort normal.  Musculoskeletal: She exhibits no edema, tenderness or deformity.       Left knee: She exhibits normal range of motion, no swelling, no effusion and no erythema. No tenderness found.       Left ankle: Normal.       Right upper leg: Normal.       Right lower leg: Normal.  Bilateral knee crepitus  Neurological: She is alert and oriented to person, place, and time.  Skin: Skin is warm and dry. No erythema.  Vitals reviewed.    Procedure Note :     Procedure : Joint Injection: right knee   Indication:  Joint osteoarthritis with refractory  chronic pain.   Risks including unsuccessful procedure , bleeding, infection, bruising, skin atrophy, "steroid flare-up" and others were explained to the patient in detail as well as the benefits. Verbal consent was obtained.  The patient was placed in a comfortable position. Lateral approach was used. Skin was prepped with Betadine and alcohol  and anesthetized a cooling spray. Then, a 10cc syringe with a 1.5 inch long 23-gauge needle was used for a joint injection. The needle was advanced  Into the left kneejoint cavity (laterally).  I aspirated a small amount of intra-articular fluid to confirm correct placement of the needle and injected the joint with 4mL of 1% lidocaine and  of Depo-Medrol .  Band-Aid was applied.   Tolerated well. Complications: None. Good pain relief following the procedure.   Postprocedure instructions :    A Band-Aid should be left on for 12 hours. Injection therapy is not a cure itself. It is used in conjunction with other modalities. You can use nonsteroidal anti-inflammatories like ibuprofen , hot and cold compresses. Rest is recommended in the next 24 hours. You need to report immediately  if  fever, chills or any signs of infection develop.  Lab Results  Component Value Date   WBC 5.4 10/25/2015   HGB 13.0 10/25/2015   HCT 38.1 10/25/2015   PLT 363 10/25/2015   GLUCOSE 107 (H) 02/16/2017   CHOL 126 10/25/2015   TRIG 75 10/25/2015   HDL 56 10/25/2015   LDLCALC 55 10/25/2015   ALT 8 10/25/2015   AST 12 10/25/2015   NA 139 02/16/2017   K 3.6 02/16/2017   CL 102 02/16/2017   CREATININE 0.82 02/16/2017   BUN 14 02/16/2017   CO2 29 02/16/2017   TSH 0.65 10/25/2015   INR 0.99 07/17/2014   HGBA1C 5.9 (H) 10/25/2015   MICROALBUR 0.6 10/25/2015    No results found.  Assessment & Plan:   Kimberly Underwood was seen today for knee pain.  Diagnoses and all orders for this visit:  Chronic pain of left knee -     methylPREDNISolone acetate (DEPO-MEDROL) injection 40 mg  Primary osteoarthritis of both knees   I am having Kimberly Underwood maintain her Knee Compression Sleeve/L/XL, Diclofenac Sodium, lisinopril-hydrochlorothiazide, Vitamin D (Ergocalciferol), and Diclofenac Sodium. We administered methylPREDNISolone acetate.  Meds ordered this encounter  Medications  . methylPREDNISolone acetate (DEPO-MEDROL) injection 40 mg    Follow-up: Return in about 1 week (around 08/18/2017) for CPE(fasting).  Alysia Penna, NP

## 2017-08-11 NOTE — Patient Instructions (Signed)
Knee Injection, Care After  Refer to this sheet in the next few weeks. These instructions provide you with information about caring for yourself after your procedure. Your health care provider may also give you more specific instructions. Your treatment has been planned according to current medical practices, but problems sometimes occur. Call your health care provider if you have any problems or questions after your procedure.  What can I expect after the procedure?  After the procedure, it is common to have:   Soreness.   Warmth.   Swelling.    You may have more pain, swelling, and warmth than you did before the injection. This reaction may last for about one day.  Follow these instructions at home:  Bathing   If you were given a bandage (dressing), keep it dry until your health care provider says it can be removed. Ask your health care provider when you can start showering or taking a bath.  Managing pain, stiffness, and swelling   If directed, apply ice to the injection area:  ? Put ice in a plastic bag.  ? Place a towel between your skin and the bag.  ? Leave the ice on for 20 minutes, 2-3 times per day.   Do not apply heat to your knee.   Raise the injection area above the level of your heart while you are sitting or lying down.  Activity   Avoid strenuous activities for as long as directed by your health care provider. Ask your health care provider when you can return to your normal activities.  General instructions   Take medicines only as directed by your health care provider.   Do not take aspirin or other over-the-counter medicines unless your health care provider says you can.   Check your injection site every day for signs of infection. Watch for:  ? Redness, swelling, or pain.  ? Fluid, blood, or pus.   Follow your health care provider's instructions about dressing changes and removal.  Contact a health care provider if:   You have symptoms at your injection site that last longer than  two days after your procedure.   You have redness, swelling, or pain in your injection area.   You have fluid, blood, or pus coming from your injection site.   You have warmth in your injection area.   You have a fever.   Your pain is not controlled with medicine.  Get help right away if:   Your knee turns very red.   Your knee becomes very swollen.   Your knee pain is severe.  This information is not intended to replace advice given to you by your health care provider. Make sure you discuss any questions you have with your health care provider.  Document Released: 04/14/2014 Document Revised: 11/28/2015 Document Reviewed: 02/01/2014  Elsevier Interactive Patient Education  2018 [employer]

## 2017-08-19 ENCOUNTER — Encounter: Payer: Self-pay | Admitting: Nurse Practitioner

## 2017-08-19 ENCOUNTER — Ambulatory Visit (INDEPENDENT_AMBULATORY_CARE_PROVIDER_SITE_OTHER): Payer: BLUE CROSS/BLUE SHIELD | Admitting: Nurse Practitioner

## 2017-08-19 VITALS — BP 124/78 | HR 98 | Temp 98.7°F | Ht 67.5 in | Wt 247.8 lb

## 2017-08-19 DIAGNOSIS — G4700 Insomnia, unspecified: Secondary | ICD-10-CM

## 2017-08-19 DIAGNOSIS — R739 Hyperglycemia, unspecified: Secondary | ICD-10-CM

## 2017-08-19 DIAGNOSIS — Z0001 Encounter for general adult medical examination with abnormal findings: Secondary | ICD-10-CM | POA: Diagnosis not present

## 2017-08-19 DIAGNOSIS — Z13220 Encounter for screening for lipoid disorders: Secondary | ICD-10-CM

## 2017-08-19 DIAGNOSIS — I10 Essential (primary) hypertension: Secondary | ICD-10-CM

## 2017-08-19 DIAGNOSIS — Z136 Encounter for screening for cardiovascular disorders: Secondary | ICD-10-CM

## 2017-08-19 MED ORDER — MELATONIN 5 MG PO TABS: 1.0000 | ORAL_TABLET | Freq: Every day | ORAL | 0 refills | Status: DC

## 2017-08-19 NOTE — Progress Notes (Signed)
Subjective:    Patient ID: Kimberly Underwood, female    DOB: [birth year], 52 y.o.   MRN: [id number]  Patient presents today for complete physical  Insomnia  Primary symptoms: fragmented sleep, difficulty falling asleep, frequent awakening, malaise/fatigue.  The onset quality is gradual. The problem occurs nightly. The problem is unchanged. The symptoms are aggravated by work stress. How many beverages per day that contain caffeine: 2-3.  Types of beverages you drink: coffee and soda. Past treatments include nothing. Typical bedtime:  8-10 P.M..  How long after going to bed to you fall asleep: 30-60 minutes.   PMH includes: associated symptoms present, hypertension, no depression, no family stress or anxiety, no restless leg syndrome, work related stressors, no chronic pain, no apnea.    Sleeps about 4hrs per night.   reports some improvement in left knee pain after knee injection. Still limping. No redness, no swelling, no weakness, no fever.  Immunizations: (TDAP, Hep C screen, Pneumovax, Influenza, zoster)  Health Maintenance  Topic Date Due  . Colon Cancer Screening  05/29/2015  . Flu Shot  11/05/2017  . Mammogram  01/11/2019  . Tetanus Vaccine  06/08/2021  . HIV Screening  Completed   Diet:regular.  Weight:  Wt Readings from Last 3 Encounters:  08/19/17 247 lb 12.8 oz (112.4 kg)  08/11/17 248 lb (112.5 kg)  03/09/17 246 lb (111.6 kg)   Exercise:none.  Fall Risk: Fall Risk  02/16/2017 12/18/2016 04/22/2016  Falls in the past year? No No No   Home Safety:home with mother.  Depression/Suicide: Depression screen [facility] 2/9 02/16/2017 12/18/2016 04/22/2016 10/25/2015  Decreased Interest 0 0 0 0  Down, Depressed, Hopeless 0 0 0 0  PHQ - 2 Score 0 0 0 0    Colonoscopy (every 5-67yrs, >50-65yrs):done [facility] Dr[facility] Elnoria Howard [facility], report requested  Vision:up to date  Dental:up to date  Advanced Directive: Advanced Directives 04/03/2015  Does Patient Have a  Medical Advance Directive? No  Would patient like information on creating a medical advance directive? No - patient declined information    Medications and allergies reviewed with patient and updated if appropriate.  Patient Active Problem List   Diagnosis Date Noted  . Degenerative arthritis of knee, bilateral 03/09/2017  . Right lateral epicondylitis 03/09/2017  . Encounter for health maintenance examination in adult 10/25/2015  . Microscopic hematuria 10/25/2015  . History of bronchitis 10/25/2015  . Overactive bladder 09/26/2014  . Anxiety 09/26/2014  . Arthritis   . Gout   . Essential hypertension, benign 12/27/2013  . Obesity (BMI 30-39.9) 12/27/2013  . Cervical dystonia 11/15/2012  . CARPAL TUNNEL SYNDROME, BILATERAL 05/22/2010    Current Outpatient Medications on File Prior to Visit  Medication Sig Dispense Refill  . Diclofenac Sodium (PENNSAID) 2 % SOLN Place 2 g onto the skin 2 (two) times daily. 112 g 3  . Elastic Bandages & Supports (KNEE COMPRESSION SLEEVE/L/XL) MISC 1 Units by Does not apply route daily. 1 each 0  . lisinopril-hydrochlorothiazide (PRINZIDE,ZESTORETIC) 20-12.5 MG tablet Take 1 tablet daily by mouth. 90 tablet 1  . Vitamin D, Ergocalciferol, 2000 units CAPS Take 1 tablet at bedtime by mouth. 90 capsule 0  . Diclofenac Sodium (PENNSAID) 2 % SOLN Place 1 application 2 (two) times daily as needed onto the skin. (Patient not taking: Reported on 08/11/2017) 112 g 0   No current facility-administered medications on file prior to visit.     Past Medical History:  Diagnosis Date  . Anxiety   .  Arthritis 2014  . CARPAL TUNNEL SYNDROME, BILATERAL 05/22/2010  . Dental caries   . Gout   . History of CT scan of head 07/2014   due to syncope, normal scan  . History of echocardiogram 07/17/14   TTE, normal LV function, 55-60% EF  . History of EKG 07/2014   PVCs, otherwise normal  . History of nuclear stress test 07/2014   normal  . HYPERTENSION 10/25/2008  .  Impaired fasting blood sugar   . Normal cardiac stress test 07/17/14   Dr. Peter [place]  . Overactive bladder   . Vitamin D deficiency   . Wears glasses     Past Surgical History:  Procedure Laterality Date  . ABDOMINAL HYSTERECTOMY  09/2010   fibroid, partial hysterectomy   . TOENAIL EXCISION Bilateral 2000's   "big toes" (12/23/2012)  . WISDOM TOOTH EXTRACTION  ~ 1985    Social History   Socioeconomic History  . Marital status: Single    Spouse name: Not on file  . Number of children: Not on file  . Years of education: Not on file  . Highest education level: Not on file  Occupational History  . Occupation: Systems analyst  . Occupation: Sport and exercise psychologist at [employer]  Social Needs  . Financial resource strain: Not on file  . Food insecurity:    Worry: Not on file    Inability: Not on file  . Transportation needs:    Medical: Not on file    Non-medical: Not on file  Tobacco Use  . Smoking status: Never Smoker  . Smokeless tobacco: Never Used  Substance and Sexual Activity  . Alcohol use: Yes    Comment: social  . Drug use: No  . Sexual activity: Yes    Birth control/protection: Surgical  Lifestyle  . Physical activity:    Days per week: Not on file    Minutes per session: Not on file  . Stress: Not on file  Relationships  . Social connections:    Talks on phone: Not on file    Gets together: Not on file    Attends religious service: Not on file    Active member of club or organization: Not on file    Attends meetings of clubs or organizations: Not on file    Relationship status: Not on file  Other Topics Concern  . Not on file  Social History Narrative   Single, 2 children, ages 71yo son, daughter age 63yo, exercise - none.  Works in Theatre stage manager at [employer].   As of 10/2015    Family History  Problem Relation Age of Onset  . Hypertension Father   . Gout Father   . Clotting disorder Father   . Lung disease Father         listeria  . Stroke Father   . Arthritis Father   . Heart disease Father   . Cancer Mother        breast cancer  . Migraines Sister   . Hypertension Sister   . Mental illness Sister   . Stroke Maternal Grandmother   . Cancer Maternal Grandfather        lung cancer (smoking)  . Heart disease Daughter        heart murmur  . Diabetes Neg Hx         Review of Systems  Constitutional: Positive for malaise/fatigue. Negative for fever and weight loss.  HENT: Negative for congestion and sore  throat.   Eyes:       Negative for visual changes  Respiratory: Negative for apnea, cough and shortness of breath.   Cardiovascular: Negative for chest pain, palpitations and leg swelling.  Gastrointestinal: Negative for blood in stool, constipation, diarrhea and heartburn.  Genitourinary: Negative for dysuria, frequency and urgency.  Musculoskeletal: Positive for joint pain. Negative for falls and myalgias.  Skin: Negative for rash.  Neurological: Negative for dizziness, sensory change and headaches.  Endo/Heme/Allergies: Does not bruise/bleed easily.  Psychiatric/Behavioral: Negative for depression, substance abuse and suicidal ideas. The patient has insomnia. The patient is not nervous/anxious.     Objective:   Vitals:   08/19/17 1520  BP: 124/78  Pulse: 98  Temp: 98.7 F (37.1 C)  SpO2: 97%    Body mass index is 38.24 kg/m.   Physical Examination:  Physical Exam  Constitutional: She is oriented to person, place, and time. She appears well-developed. No distress.  HENT:  Right Ear: External ear normal.  Left Ear: External ear normal.  Nose: Nose normal.  Mouth/Throat: Oropharynx is clear and moist. No oropharyngeal exudate.  Eyes: Pupils are equal, round, and reactive to light. Conjunctivae and EOM are normal.  Neck: Normal range of motion. Neck supple.  Cardiovascular: Normal rate, regular rhythm, normal heart sounds and intact distal pulses.  Pulmonary/Chest: Effort  normal and breath sounds normal. No respiratory distress. She exhibits no tenderness.  Abdominal: Soft. Bowel sounds are normal.  Genitourinary:  Genitourinary Comments: Deferred breast and pelvic exam to GYN  Musculoskeletal: Normal range of motion. She exhibits edema. She exhibits no tenderness.  Trace bilateral ankle edema (non pitting)  Neurological: She is alert and oriented to person, place, and time. She has normal reflexes.  Skin: Skin is warm and dry.  Psychiatric: She has a normal mood and affect. Her behavior is normal. Thought content normal.  Vitals reviewed.   ASSESSMENT and PLAN:  Torria was seen today for annual exam.  Diagnoses and all orders for this visit:  Encounter for preventative adult health care exam with abnormal findings -     CBC -     TSH -     Lipid panel -     Comprehensive metabolic panel  Essential hypertension, benign  Insomnia, unspecified type -     Melatonin 5 MG TABS; Take 1-2 tablets (5-10 mg total) by mouth daily.  Encounter for lipid screening for cardiovascular disease -     Lipid panel  Hyperglycemia -     Hemoglobin A1c  Other orders -     Cancel: VITAMIN D 25 Hydroxy (Vit-D Deficiency, Fractures)   No problem-specific Assessment & Plan notes found for this encounter.     Follow up: Return in about 3 months (around 11/19/2017) for HTN and weight loss.  Alysia Penna, NP

## 2017-08-19 NOTE — Patient Instructions (Signed)
Sign medical release to get colonoscopy report from Dr. Benson Norway with [facility].  Return to lab fasting for blood draw.  Form will be completed after lab are completed.  Health Maintenance, Female Adopting a healthy lifestyle and getting preventive care can go a long way to promote health and wellness. Talk with your health care provider about what schedule of regular examinations is right for you. This is a good chance for you to check in with your provider about disease prevention and staying healthy. In between checkups, there are plenty of things you can do on your own. Experts have done a lot of research about which lifestyle changes and preventive measures are most likely to keep you healthy. Ask your health care provider for more information. Weight and diet Eat a healthy diet  Be sure to include plenty of vegetables, fruits, low-fat dairy products, and lean protein.  Do not eat a lot of foods high in solid fats, added sugars, or salt.  Get regular exercise. This is one of the most important things you can do for your health. ? Most adults should exercise for at least 150 minutes each week. The exercise should increase your heart rate and make you sweat (moderate-intensity exercise). ? Most adults should also do strengthening exercises at least twice a week. This is in addition to the moderate-intensity exercise.  Maintain a healthy weight  Body mass index (BMI) is a measurement that can be used to identify possible weight problems. It estimates body fat based on height and weight. Your health care provider can help determine your BMI and help you achieve or maintain a healthy weight.  For females 8 years of age and older: ? A BMI below 18.5 is considered underweight. ? A BMI of 18.5 to 24.9 is normal. ? A BMI of 25 to 29.9 is considered overweight. ? A BMI of 30 and above is considered obese.  Watch levels of cholesterol and blood lipids  You should start having  your blood tested for lipids and cholesterol at 52 years of age, then have this test every 5 years.  You may need to have your cholesterol levels checked more often if: ? Your lipid or cholesterol levels are high. ? You are older than 52 years of age. ? You are at high risk for heart disease.  Cancer screening Lung Cancer  Lung cancer screening is recommended for adults 43-58 years old who are at high risk for lung cancer because of a history of smoking.  A yearly low-dose CT scan of the lungs is recommended for people who: ? Currently smoke. ? Have quit within the past 15 years. ? Have at least a 30-pack-year history of smoking. A pack year is smoking an average of one pack of cigarettes a day for 1 year.  Yearly screening should continue until it has been 15 years since you quit.  Yearly screening should stop if you develop a health problem that would prevent you from having lung cancer treatment.  Breast Cancer  Practice breast self-awareness. This means understanding how your breasts normally appear and feel.  It also means doing regular breast self-exams. Let your health care provider know about any changes, no matter how small.  If you are in your 20s or 30s, you should have a clinical breast exam (CBE) by a health care provider every 1-3 years as part of a regular health exam.  If you are 35 or older, have a CBE every year. Also consider having  a breast X-ray (mammogram) every year.  If you have a family history of breast cancer, talk to your health care provider about genetic screening.  If you are at high risk for breast cancer, talk to your health care provider about having an MRI and a mammogram every year.  Breast cancer gene (BRCA) assessment is recommended for women who have family members with BRCA-related cancers. BRCA-related cancers include: ? Breast. ? Ovarian. ? Tubal. ? Peritoneal cancers.  Results of the assessment will determine the need for genetic  counseling and BRCA1 and BRCA2 testing.  Cervical Cancer Your health care provider may recommend that you be screened regularly for cancer of the pelvic organs (ovaries, uterus, and vagina). This screening involves a pelvic examination, including checking for microscopic changes to the surface of your cervix (Pap test). You may be encouraged to have this screening done every 3 years, beginning at age 68.  For women ages 50-65, health care providers may recommend pelvic exams and Pap testing every 3 years, or they may recommend the Pap and pelvic exam, combined with testing for human papilloma virus (HPV), every 5 years. Some types of HPV increase your risk of cervical cancer. Testing for HPV may also be done on women of any age with unclear Pap test results.  Other health care providers may not recommend any screening for nonpregnant women who are considered low risk for pelvic cancer and who do not have symptoms. Ask your health care provider if a screening pelvic exam is right for you.  If you have had past treatment for cervical cancer or a condition that could lead to cancer, you need Pap tests and screening for cancer for at least 20 years after your treatment. If Pap tests have been discontinued, your risk factors (such as having a new sexual partner) need to be reassessed to determine if screening should resume. Some women have medical problems that increase the chance of getting cervical cancer. In these cases, your health care provider may recommend more frequent screening and Pap tests.  Colorectal Cancer  This type of cancer can be detected and often prevented.  Routine colorectal cancer screening usually begins at 52 years of age and continues through 52 years of age.  Your health care provider may recommend screening at an earlier age if you have risk factors for colon cancer.  Your health care provider may also recommend using home test kits to check for hidden blood in the  stool.  A small camera at the end of a tube can be used to examine your colon directly (sigmoidoscopy or colonoscopy). This is done to check for the earliest forms of colorectal cancer.  Routine screening usually begins at age 43.  Direct examination of the colon should be repeated every 5-10 years through 52 years of age. However, you may need to be screened more often if early forms of precancerous polyps or small growths are found.  Skin Cancer  Check your skin from head to toe regularly.  Tell your health care provider about any new moles or changes in moles, especially if there is a change in a mole's shape or color.  Also tell your health care provider if you have a mole that is larger than the size of a pencil eraser.  Always use sunscreen. Apply sunscreen liberally and repeatedly throughout the day.  Protect yourself by wearing long sleeves, pants, a wide-brimmed hat, and sunglasses whenever you are outside.  Heart disease, diabetes, and high blood pressure  High blood pressure causes heart disease and increases the risk of stroke. High blood pressure is more likely to develop in: ? People who have blood pressure in the high end of the normal range (130-139/85-89 mm Hg). ? People who are overweight or obese. ? People who are African American.  If you are 14-51 years of age, have your blood pressure checked every 3-5 years. If you are 50 years of age or older, have your blood pressure checked every year. You should have your blood pressure measured twice-once when you are at a hospital or clinic, and once when you are not at a hospital or clinic. Record the average of the two measurements. To check your blood pressure when you are not at a hospital or clinic, you can use: ? An automated blood pressure machine at a pharmacy. ? A home blood pressure monitor.  If you are between 6 years and 19 years old, ask your health care provider if you should take aspirin to prevent  strokes.  Have regular diabetes screenings. This involves taking a blood sample to check your fasting blood sugar level. ? If you are at a normal weight and have a low risk for diabetes, have this test once every three years after 52 years of age. ? If you are overweight and have a high risk for diabetes, consider being tested at a younger age or more often. Preventing infection Hepatitis B  If you have a higher risk for hepatitis B, you should be screened for this virus. You are considered at high risk for hepatitis B if: ? You were born in a country where hepatitis B is common. Ask your health care provider which countries are considered high risk. ? Your parents were born in a high-risk country, and you have not been immunized against hepatitis B (hepatitis B vaccine). ? You have HIV or AIDS. ? You use needles to inject street drugs. ? You live with someone who has hepatitis B. ? You have had sex with someone who has hepatitis B. ? You get hemodialysis treatment. ? You take certain medicines for conditions, including cancer, organ transplantation, and autoimmune conditions.  Hepatitis C  Blood testing is recommended for: ? Everyone born from [birth year] through 1965. ? Anyone with known risk factors for hepatitis C.  Sexually transmitted infections (STIs)  You should be screened for sexually transmitted infections (STIs) including gonorrhea and chlamydia if: ? You are sexually active and are younger than 52 years of age. ? You are older than 52 years of age and your health care provider tells you that you are at risk for this type of infection. ? Your sexual activity has changed since you were last screened and you are at an increased risk for chlamydia or gonorrhea. Ask your health care provider if you are at risk.  If you do not have HIV, but are at risk, it may be recommended that you take a prescription medicine daily to prevent HIV infection. This is called pre-exposure prophylaxis  (PrEP). You are considered at risk if: ? You are sexually active and do not regularly use condoms or know the HIV status of your partner(s). ? You take drugs by injection. ? You are sexually active with a partner who has HIV.  Talk with your health care provider about whether you are at high risk of being infected with HIV. If you choose to begin PrEP, you should first be tested for HIV. You should then be tested every 3 months  for as long as you are taking PrEP. Pregnancy  If you are premenopausal and you may become pregnant, ask your health care provider about preconception counseling.  If you may become pregnant, take 400 to 800 micrograms (mcg) of folic acid every day.  If you want to prevent pregnancy, talk to your health care provider about birth control (contraception). Osteoporosis and menopause  Osteoporosis is a disease in which the bones lose minerals and strength with aging. This can result in serious bone fractures. Your risk for osteoporosis can be identified using a bone density scan.  If you are 8 years of age or older, or if you are at risk for osteoporosis and fractures, ask your health care provider if you should be screened.  Ask your health care provider whether you should take a calcium or vitamin D supplement to lower your risk for osteoporosis.  Menopause may have certain physical symptoms and risks.  Hormone replacement therapy may reduce some of these symptoms and risks. Talk to your health care provider about whether hormone replacement therapy is right for you. Follow these instructions at home:  Schedule regular health, dental, and eye exams.  Stay current with your immunizations.  Do not use any tobacco products including cigarettes, chewing tobacco, or electronic cigarettes.  If you are pregnant, do not drink alcohol.  If you are breastfeeding, limit how much and how often you drink alcohol.  Limit alcohol intake to no more than 1 drink per day for  nonpregnant women. One drink equals 12 ounces of beer, 5 ounces of wine, or 1 ounces of hard liquor.  Do not use street drugs.  Do not share needles.  Ask your health care provider for help if you need support or information about quitting drugs.  Tell your health care provider if you often feel depressed.  Tell your health care provider if you have ever been abused or do not feel safe at home. This information is not intended to replace advice given to you by your health care provider. Make sure you discuss any questions you have with your health care provider. Document Released: 10/07/2010 Document Revised: 08/30/2015 Document Reviewed: 12/26/2014 Elsevier Interactive Patient Education  [employer].

## 2017-08-20 ENCOUNTER — Encounter: Payer: Self-pay | Admitting: Nurse Practitioner

## 2017-08-24 ENCOUNTER — Other Ambulatory Visit (INDEPENDENT_AMBULATORY_CARE_PROVIDER_SITE_OTHER): Payer: BLUE CROSS/BLUE SHIELD

## 2017-08-24 DIAGNOSIS — R739 Hyperglycemia, unspecified: Secondary | ICD-10-CM

## 2017-08-24 LAB — LIPID PANEL
CHOL/HDL RATIO: 3
Cholesterol: 138 mg/dL (ref 0–200)
HDL: 53.7 mg/dL (ref >39.00)
LDL CALC: 66 mg/dL (ref 0–99)
NONHDL: 84.35
Triglycerides: 90 mg/dL (ref 0.0–149.0)
VLDL: 18 mg/dL (ref 0.0–40.0)

## 2017-08-24 LAB — TSH: TSH: 1.07 u[IU]/mL (ref 0.35–4.50)

## 2017-08-24 LAB — CBC
HCT: 38.8 % (ref 36.0–46.0)
HEMOGLOBIN: 13.1 g/dL (ref 12.0–15.0)
MCHC: 33.8 g/dL (ref 30.0–36.0)
MCV: 85.8 fl (ref 78.0–100.0)
Platelets: 327 10*3/uL (ref 150.0–400.0)
RBC: 4.52 Mil/uL (ref 3.87–5.11)
RDW: 13.3 % (ref 11.5–15.5)
WBC: 5.6 10*3/uL (ref 4.0–10.5)

## 2017-08-24 LAB — COMPREHENSIVE METABOLIC PANEL
ALT: 9 U/L (ref 0–35)
AST: 10 U/L (ref 0–37)
Albumin: 3.8 g/dL (ref 3.5–5.2)
Alkaline Phosphatase: 79 U/L (ref 39–117)
BUN: 14 mg/dL (ref 6–23)
CO2: 26 meq/L (ref 19–32)
CREATININE: 0.72 mg/dL (ref 0.40–1.20)
Calcium: 9.1 mg/dL (ref 8.4–10.5)
Chloride: 105 mEq/L (ref 96–112)
GFR: 109.29 mL/min (ref >60.00)
GLUCOSE: 101 mg/dL — AB (ref 70–99)
Potassium: 4 mEq/L (ref 3.5–5.1)
SODIUM: 139 meq/L (ref 135–145)
Total Bilirubin: 0.3 mg/dL (ref 0.2–1.2)
Total Protein: 6.9 g/dL (ref 6.0–8.3)

## 2017-08-24 LAB — HEMOGLOBIN A1C: Hgb A1c MFr Bld: 6.3 % (ref 4.6–6.5)

## 2017-09-07 ENCOUNTER — Other Ambulatory Visit: Payer: Self-pay | Admitting: Nurse Practitioner

## 2017-09-07 DIAGNOSIS — I10 Essential (primary) hypertension: Secondary | ICD-10-CM

## 2017-10-27 ENCOUNTER — Encounter: Payer: Self-pay | Admitting: Nurse Practitioner

## 2017-10-27 NOTE — Progress Notes (Unsigned)
Abstracted result and sent to scan  

## 2017-11-20 ENCOUNTER — Encounter: Payer: Self-pay | Admitting: Nurse Practitioner

## 2017-11-20 ENCOUNTER — Ambulatory Visit (INDEPENDENT_AMBULATORY_CARE_PROVIDER_SITE_OTHER): Payer: BLUE CROSS/BLUE SHIELD | Admitting: Nurse Practitioner

## 2017-11-20 VITALS — BP 120/78 | HR 98 | Temp 98.4°F | Ht 67.5 in | Wt 251.8 lb

## 2017-11-20 DIAGNOSIS — E6609 Other obesity due to excess calories: Secondary | ICD-10-CM | POA: Diagnosis not present

## 2017-11-20 DIAGNOSIS — I10 Essential (primary) hypertension: Secondary | ICD-10-CM | POA: Diagnosis not present

## 2017-11-20 DIAGNOSIS — Z6838 Body mass index (BMI) 38.0-38.9, adult: Secondary | ICD-10-CM | POA: Diagnosis not present

## 2017-11-20 MED ORDER — PHENTERMINE HCL 30 MG PO CAPS: 30.0000 mg | ORAL_CAPSULE | ORAL | 0 refills | Status: DC

## 2017-11-20 MED ORDER — LISINOPRIL-HYDROCHLOROTHIAZIDE 20-12.5 MG PO TABS: 1.0000 | ORAL_TABLET | Freq: Every day | ORAL | 3 refills | Status: DC

## 2017-11-20 NOTE — Patient Instructions (Signed)
Start exercise (walking or stationery bicycle 30-3745mins) daily.  Make diet changes and decrease portion size.   Calorie Counting for Weight Loss Calories are units of energy. Your body needs a certain amount of calories from food to keep you going throughout the day. When you eat more calories than your body needs, your body stores the extra calories as fat. When you eat fewer calories than your body needs, your body burns fat to get the energy it needs. Calorie counting means keeping track of how many calories you eat and drink each day. Calorie counting can be helpful if you need to lose weight. If you make sure to eat fewer calories than your body needs, you should lose weight. Ask your health care provider what a healthy weight is for you. For calorie counting to work, you will need to eat the right number of calories in a day in order to lose a healthy amount of weight per week. A dietitian can help you determine how many calories you need in a day and will give you suggestions on how to reach your calorie goal.  A healthy amount of weight to lose per week is usually 1-2 lb (0.5-0.9 kg). This usually means that your daily calorie intake should be reduced by 500-750 calories.  Eating 1,200 - 1,500 calories per day can help most women lose weight.  Eating 1,500 - 1,800 calories per day can help most men lose weight.  What is my plan? My goal is to have __1800________ calories per day. If I have this many calories per day, I should lose around _____1_____ pounds per week. What do I need to know about calorie counting? In order to meet your daily calorie goal, you will need to:  Find out how many calories are in each food you would like to eat. Try to do this before you eat.  Decide how much of the food you plan to eat.  Write down what you ate and how many calories it had. Doing this is called keeping a food log.  To successfully lose weight, it is important to balance calorie counting  with a healthy lifestyle that includes regular activity. Aim for 150 minutes of moderate exercise (such as walking) or 75 minutes of vigorous exercise (such as running) each week. Where do I find calorie information?  The number of calories in a food can be found on a Nutrition Facts label. If a food does not have a Nutrition Facts label, try to look up the calories online or ask your dietitian for help. Remember that calories are listed per serving. If you choose to have more than one serving of a food, you will have to multiply the calories per serving by the amount of servings you plan to eat. For example, the label on a package of bread might say that a serving size is 1 slice and that there are 90 calories in a serving. If you eat 1 slice, you will have eaten 90 calories. If you eat 2 slices, you will have eaten 180 calories. How do I keep a food log? Immediately after each meal, record the following information in your food log:  What you ate. Don't forget to include toppings, sauces, and other extras on the food.  How much you ate. This can be measured in cups, ounces, or number of items.  How many calories each food and drink had.  The total number of calories in the meal.  Keep your food log near  you, such as in a small notebook in your pocket, or use a mobile app or website. Some programs will calculate calories for you and show you how many calories you have left for the day to meet your goal. What are some calorie counting tips?  Use your calories on foods and drinks that will fill you up and not leave you hungry: ? Some examples of foods that fill you up are nuts and nut butters, vegetables, lean proteins, and high-fiber foods like whole grains. High-fiber foods are foods with more than 5 g fiber per serving. ? Drinks such as sodas, specialty coffee drinks, alcohol, and juices have a lot of calories, yet do not fill you up.  Eat nutritious foods and avoid empty calories. Empty  calories are calories you get from foods or beverages that do not have many vitamins or protein, such as candy, sweets, and soda. It is better to have a nutritious high-calorie food (such as an avocado) than a food with few nutrients (such as a bag of chips).  Know how many calories are in the foods you eat most often. This will help you calculate calorie counts faster.  Pay attention to calories in drinks. Low-calorie drinks include water and unsweetened drinks.  Pay attention to nutrition labels for "low fat" or "fat free" foods. These foods sometimes have the same amount of calories or more calories than the full fat versions. They also often have added sugar, starch, or salt, to make up for flavor that was removed with the fat.  Find a way of tracking calories that works for you. Get creative. Try different apps or programs if writing down calories does not work for you. What are some portion control tips?  Know how many calories are in a serving. This will help you know how many servings of a certain food you can have.  Use a measuring cup to measure serving sizes. You could also try weighing out portions on a kitchen scale. With time, you will be able to estimate serving sizes for some foods.  Take some time to put servings of different foods on your favorite plates, bowls, and cups so you know what a serving looks like.  Try not to eat straight from a bag or box. Doing this can lead to overeating. Put the amount you would like to eat in a cup or on a plate to make sure you are eating the right portion.  Use smaller plates, glasses, and bowls to prevent overeating.  Try not to multitask (for example, watch TV or use your computer) while eating. If it is time to eat, sit down at a table and enjoy your food. This will help you to know when you are full. It will also help you to be aware of what you are eating and how much you are eating. What are tips for following this plan? Reading food  labels  Check the calorie count compared to the serving size. The serving size may be smaller than what you are used to eating.  Check the source of the calories. Make sure the food you are eating is high in vitamins and protein and low in saturated and trans fats. Shopping  Read nutrition labels while you shop. This will help you make healthy decisions before you decide to purchase your food.  Make a grocery list and stick to it. Cooking  Try to cook your favorite foods in a healthier way. For example, try baking instead of frying.  Use low-fat dairy products. Meal planning  Use more fruits and vegetables. Half of your plate should be fruits and vegetables.  Include lean proteins like poultry and fish. How do I count calories when eating out?  Ask for smaller portion sizes.  Consider sharing an entree and sides instead of getting your own entree.  If you get your own entree, eat only half. Ask for a box at the beginning of your meal and put the rest of your entree in it so you are not tempted to eat it.  If calories are listed on the menu, choose the lower calorie options.  Choose dishes that include vegetables, fruits, whole grains, low-fat dairy products, and lean protein.  Choose items that are boiled, broiled, grilled, or steamed. Stay away from items that are buttered, battered, fried, or served with cream sauce. Items labeled "crispy" are usually fried, unless stated otherwise.  Choose water, low-fat milk, unsweetened iced tea, or other drinks without added sugar. If you want an alcoholic beverage, choose a lower calorie option such as a glass of wine or light beer.  Ask for dressings, sauces, and syrups on the side. These are usually high in calories, so you should limit the amount you eat.  If you want a salad, choose a garden salad and ask for grilled meats. Avoid extra toppings like bacon, cheese, or fried items. Ask for the dressing on the side, or ask for olive oil  and vinegar or lemon to use as dressing.  Estimate how many servings of a food you are given. For example, a serving of cooked rice is  cup or about the size of half a baseball. Knowing serving sizes will help you be aware of how much food you are eating at restaurants. The list below tells you how big or small some common portion sizes are based on everyday objects: ? 1 oz-4 stacked dice. ? 3 oz-1 deck of cards. ? 1 tsp-1 die. ? 1 Tbsp- a ping-pong ball. ? 2 Tbsp-1 ping-pong ball. ?  cup- baseball. ? 1 cup-1 baseball. Summary  Calorie counting means keeping track of how many calories you eat and drink each day. If you eat fewer calories than your body needs, you should lose weight.  A healthy amount of weight to lose per week is usually 1-2 lb (0.5-0.9 kg). This usually means reducing your daily calorie intake by 500-750 calories.  The number of calories in a food can be found on a Nutrition Facts label. If a food does not have a Nutrition Facts label, try to look up the calories online or ask your dietitian for help.  Use your calories on foods and drinks that will fill you up, and not on foods and drinks that will leave you hungry.  Use smaller plates, glasses, and bowls to prevent overeating. This information is not intended to replace advice given to you by your health care provider. Make sure you discuss any questions you have with your health care provider. Document Released: 03/24/2005 Document Revised: 02/22/2016 Document Reviewed: 02/22/2016 Elsevier Interactive Patient Education  [employer].

## 2017-11-20 NOTE — Progress Notes (Signed)
Subjective:  Patient ID: Kimberly Underwood, female    DOB: [birth year]  Age: 52 y.o. MRN: [id number]  CC: Follow-up (3 mo fu/ HTN and weight loss. not exercise or diet right now. )   HPI  HTN: Controlled with lisinopril/HCTZ. BP Readings from Last 3 Encounters:  11/20/17 120/78  08/19/17 124/78  08/11/17 106/74   OBESITY: HAS NOT MADE CHNAGES TO DIET NOR HAS SHE START ANY EXERCISE REGIMEN. WILL LIKE HELP WITH DIET, AS WELL AS USE MEDICATION.  Reviewed past Medical, Social and Family history today.  Outpatient Medications Prior to Visit  Medication Sig Dispense Refill  . Diclofenac Sodium (PENNSAID) 2 % SOLN Place 2 g onto the skin 2 (two) times daily. 112 g 3  . Melatonin 5 MG TABS Take 1-2 tablets (5-10 mg total) by mouth daily.  0  . Vitamin D, Ergocalciferol, 2000 units CAPS Take 1 tablet at bedtime by mouth. 90 capsule 0  . lisinopril-hydrochlorothiazide (PRINZIDE,ZESTORETIC) 20-12.5 MG tablet TAKE 1 TABLET BY MOUTH ONCE DAILY 90 tablet 0  . Diclofenac Sodium (PENNSAID) 2 % SOLN Place 1 application 2 (two) times daily as needed onto the skin. (Patient not taking: Reported on 08/11/2017) 112 g 0  . Elastic Bandages & Supports (KNEE COMPRESSION SLEEVE/L/XL) MISC 1 Units by Does not apply route daily. (Patient not taking: Reported on 11/20/2017) 1 each 0   No facility-administered medications prior to visit.     ROS See HPI  Objective:  BP 120/78   Pulse 98   Temp 98.4 F (36.9 C) (Oral)   Ht 5' 7.5" (1.715 m)   Wt 251 lb 12.8 oz (114.2 kg)   SpO2 95%   BMI 38.86 kg/m   BP Readings from Last 3 Encounters:  11/20/17 120/78  08/19/17 124/78  08/11/17 106/74    Wt Readings from Last 3 Encounters:  11/20/17 251 lb 12.8 oz (114.2 kg)  08/19/17 247 lb 12.8 oz (112.4 kg)  08/11/17 248 lb (112.5 kg)    Physical Exam  Constitutional: She is oriented to person, place, and time. She appears well-developed and well-nourished.  Cardiovascular: Normal rate, regular rhythm  and normal heart sounds.  Pulmonary/Chest: Effort normal and breath sounds normal.  Musculoskeletal: She exhibits no edema.  Neurological: She is alert and oriented to person, place, and time.  Vitals reviewed.   Lab Results  Component Value Date   WBC 5.6 08/24/2017   HGB 13.1 08/24/2017   HCT 38.8 08/24/2017   PLT 327.0 08/24/2017   GLUCOSE 101 (H) 08/24/2017   CHOL 138 08/24/2017   TRIG 90.0 08/24/2017   HDL 53.70 08/24/2017   LDLCALC 66 08/24/2017   ALT 9 08/24/2017   AST 10 08/24/2017   NA 139 08/24/2017   K 4.0 08/24/2017   CL 105 08/24/2017   CREATININE 0.72 08/24/2017   BUN 14 08/24/2017   CO2 26 08/24/2017   TSH 1.07 08/24/2017   INR 0.99 07/17/2014   HGBA1C 6.3 08/24/2017   MICROALBUR 0.6 10/25/2015    Assessment & Plan:  I Spent 30mins with Mrs. Stankovic, 50% of today's visit was spent discussing lifestyle modification to enable healthy and sustainable weight loss. We discussed the need for Heart healthy diet, portion control and regular exercise. We also talked about different appetite suppressants, their cost and possible side effects. She opted to use phentermine. SHE WAS advised ABOUT POSSIBLE SIDE EFFECTS OF PHENTERMINE. SHE IS TO STOP MEDICATION IF SHE DEVELOPS ANY OF THE ADVERSE SIDE EFFECTS.  Kimberly Underwood was  seen today for follow-up.  Diagnoses and all orders for this visit:  Essential hypertension, benign -     lisinopril-hydrochlorothiazide (PRINZIDE,ZESTORETIC) 20-12.5 MG tablet; Take 1 tablet by mouth daily.  Class 2 obesity due to excess calories with body mass index (BMI) of 38.0 to 38.9 in adult, unspecified whether serious comorbidity present -     Amb Ref to Medical Weight Management -     phentermine 30 MG capsule; Take 1 capsule (30 mg total) by mouth every morning.   I have discontinued Kimberly Underwood's Knee Compression Sleeve/L/XL. I have also changed her lisinopril-hydrochlorothiazide. Additionally, I am having her start on phentermine.  Lastly, I am having her maintain her Vitamin D (Ergocalciferol), Diclofenac Sodium, and Melatonin.  Meds ordered this encounter  Medications  . phentermine 30 MG capsule    Sig: Take 1 capsule (30 mg total) by mouth every morning.    Dispense:  30 capsule    Refill:  0    Order Specific Question:   Supervising Provider    Answer:   Clare GandySCHMITZ, JEREMY E [[id number]]  . lisinopril-hydrochlorothiazide (PRINZIDE,ZESTORETIC) 20-12.5 MG tablet    Sig: Take 1 tablet by mouth daily.    Dispense:  90 tablet    Refill:  3    Order Specific Question:   Supervising Provider    Answer:   Clare GandySCHMITZ, JEREMY E [[id number]]    Follow-up: Return in about 4 weeks (around 12/18/2017) for weight loss management.  Alysia Pennaharlotte Steele Stracener, NP

## 2017-11-23 ENCOUNTER — Encounter: Payer: Self-pay | Admitting: Nurse Practitioner

## 2017-12-11 DIAGNOSIS — E669 Obesity, unspecified: Secondary | ICD-10-CM | POA: Diagnosis not present

## 2017-12-11 DIAGNOSIS — Z713 Dietary counseling and surveillance: Secondary | ICD-10-CM | POA: Diagnosis not present

## 2017-12-21 ENCOUNTER — Ambulatory Visit: Payer: BLUE CROSS/BLUE SHIELD | Admitting: Nurse Practitioner

## 2018-02-03 DIAGNOSIS — Z803 Family history of malignant neoplasm of breast: Secondary | ICD-10-CM | POA: Diagnosis not present

## 2018-02-03 DIAGNOSIS — Z1231 Encounter for screening mammogram for malignant neoplasm of breast: Secondary | ICD-10-CM | POA: Diagnosis not present

## 2018-02-03 LAB — HM MAMMOGRAPHY

## 2018-02-04 ENCOUNTER — Encounter: Payer: Self-pay | Admitting: Nurse Practitioner

## 2018-02-04 NOTE — Progress Notes (Signed)
Abstracted result and sent to scan  

## 2018-02-18 ENCOUNTER — Encounter (INDEPENDENT_AMBULATORY_CARE_PROVIDER_SITE_OTHER): Payer: BLUE CROSS/BLUE SHIELD

## 2018-03-01 ENCOUNTER — Ambulatory Visit (INDEPENDENT_AMBULATORY_CARE_PROVIDER_SITE_OTHER): Payer: BLUE CROSS/BLUE SHIELD | Admitting: Family Medicine

## 2018-03-17 ENCOUNTER — Ambulatory Visit (INDEPENDENT_AMBULATORY_CARE_PROVIDER_SITE_OTHER): Payer: BLUE CROSS/BLUE SHIELD | Admitting: Family Medicine

## 2018-06-14 DIAGNOSIS — N898 Other specified noninflammatory disorders of vagina: Secondary | ICD-10-CM | POA: Diagnosis not present

## 2018-06-14 DIAGNOSIS — E669 Obesity, unspecified: Secondary | ICD-10-CM | POA: Diagnosis not present

## 2018-06-14 DIAGNOSIS — Z01411 Encounter for gynecological examination (general) (routine) with abnormal findings: Secondary | ICD-10-CM | POA: Diagnosis not present

## 2018-06-14 DIAGNOSIS — N3281 Overactive bladder: Secondary | ICD-10-CM | POA: Diagnosis not present

## 2018-07-23 ENCOUNTER — Encounter: Payer: Self-pay | Admitting: Nurse Practitioner

## 2018-07-23 ENCOUNTER — Ambulatory Visit (INDEPENDENT_AMBULATORY_CARE_PROVIDER_SITE_OTHER): Payer: BLUE CROSS/BLUE SHIELD | Admitting: Nurse Practitioner

## 2018-07-23 VITALS — BP 124/76 | HR 96 | Temp 98.6°F | Ht 67.5 in | Wt 248.2 lb

## 2018-07-23 DIAGNOSIS — M778 Other enthesopathies, not elsewhere classified: Secondary | ICD-10-CM

## 2018-07-23 DIAGNOSIS — R739 Hyperglycemia, unspecified: Secondary | ICD-10-CM

## 2018-07-23 DIAGNOSIS — L738 Other specified follicular disorders: Secondary | ICD-10-CM

## 2018-07-23 DIAGNOSIS — E559 Vitamin D deficiency, unspecified: Secondary | ICD-10-CM | POA: Diagnosis not present

## 2018-07-23 DIAGNOSIS — I10 Essential (primary) hypertension: Secondary | ICD-10-CM | POA: Diagnosis not present

## 2018-07-23 MED ORDER — IBUPROFEN-FAMOTIDINE 800-26.6 MG PO TABS: 1.0000 | ORAL_TABLET | Freq: Two times a day (BID) | ORAL | 0 refills | Status: DC | PRN

## 2018-07-23 MED ORDER — IBUPROFEN-FAMOTIDINE 800-26.6 MG PO TABS: 1.0000 | ORAL_TABLET | Freq: Two times a day (BID) | ORAL | 0 refills | Status: DC

## 2018-07-23 MED ORDER — VITAMIN D (ERGOCALCIFEROL) 50 MCG (2000 UT) PO CAPS: 1.0000 | ORAL_CAPSULE | Freq: Every day | ORAL | 3 refills | Status: DC

## 2018-07-23 MED ORDER — PREDNISONE 10 MG (21) PO TBPK: ORAL_TABLET | ORAL | 0 refills | Status: DC

## 2018-07-23 NOTE — Progress Notes (Signed)
Subjective:  Patient ID: Kimberly Underwood, female    DOB: Jul 11, 1965  Age: 53 y.o. MRN: [id number]  CC: Follow-up (follow up on weight loss--do not check weight daily/ pt request refill for Vit D?); Cyst (pt notice lump or cyst on her right arm,denied pain/noticed 2 days); and Arm Pain (pt complaining of right arm pain on certain motions,stiffness,domanet hand/2-3 weeks/rest no otc med. )  Rash  This is a new problem. The current episode started in the past 7 days. The problem is unchanged. The affected locations include the right axilla. Rash characteristics: nodule. She was exposed to nothing. Pertinent negatives include no anorexia, fever, joint pain or nail changes. Past treatments include nothing.  Wrist Pain   The pain is present in the right wrist and right arm. This is a new problem. The current episode started 1 to 4 weeks ago. The problem occurs constantly. The problem has been waxing and waning. The quality of the pain is described as aching and dull. Associated symptoms include joint swelling, stiffness and tingling. Pertinent negatives include no fever, inability to bear weight, itching, joint locking, limited range of motion or numbness. The symptoms are aggravated by activity. She has tried rest for the symptoms. The treatment provided no relief. Family history does not include gout or rheumatoid arthritis. Her past medical history is significant for osteoarthritis. There is no history of diabetes, gout or rheumatoid arthritis.  shaves with blade every 3weeks.  Current job entails repetitive movement with right hand, right hand dominant.  HTN: controlled with lisinopril/HCTZ BP Readings from Last 3 Encounters:  07/23/18 124/76  11/20/17 120/78  08/19/17 124/78   Reviewed past Medical, Social and Family history today.  Outpatient Medications Prior to Visit  Medication Sig Dispense Refill  . lisinopril-hydrochlorothiazide (PRINZIDE,ZESTORETIC) 20-12.5 MG tablet Take 1 tablet by  mouth daily. 90 tablet 3  . Melatonin 5 MG TABS Take 1-2 tablets (5-10 mg total) by mouth daily.  0  . Diclofenac Sodium (PENNSAID) 2 % SOLN Place 2 g onto the skin 2 (two) times daily. 112 g 3  . Vitamin D, Ergocalciferol, 2000 units CAPS Take 1 tablet at bedtime by mouth. 90 capsule 0  . phentermine 30 MG capsule Take 1 capsule (30 mg total) by mouth every morning. (Patient not taking: Reported on 07/23/2018) 30 capsule 0   No facility-administered medications prior to visit.     ROS See HPI  Objective:  BP 124/76   Pulse 96   Temp 98.6 F (37 C) (Oral)   Ht 5' 7.5" (1.715 m)   Wt 248 lb 3.2 oz (112.6 kg)   SpO2 97%   BMI 38.30 kg/m   BP Readings from Last 3 Encounters:  07/23/18 124/76  11/20/17 120/78  08/19/17 124/78    Wt Readings from Last 3 Encounters:  07/23/18 248 lb 3.2 oz (112.6 kg)  11/20/17 251 lb 12.8 oz (114.2 kg)  08/19/17 247 lb 12.8 oz (112.4 kg)    Physical Exam Vitals signs reviewed. Exam conducted with a chaperone present.  Cardiovascular:     Rate and Rhythm: Normal rate.  Pulmonary:     Effort: Pulmonary effort is normal.  Chest:     Breasts: Breasts are symmetrical.        Right: Normal.        Left: Normal.    Musculoskeletal: Normal range of motion.        General: Tenderness present. No swelling or deformity.     Right shoulder: Normal.  Right elbow: Normal.    Right wrist: She exhibits tenderness. She exhibits normal range of motion, no bony tenderness, no swelling and no effusion.     Cervical back: Normal.     Right upper arm: Normal.     Right forearm: Normal.     Right hand: She exhibits normal range of motion, no tenderness, no laceration and no swelling. Normal sensation noted. Decreased strength noted. She exhibits thumb/finger opposition. She exhibits no finger abduction and no wrist extension trouble.     Right lower leg: No edema.     Left lower leg: No edema.  Lymphadenopathy:     Upper Body:     Right upper body:  No supraclavicular, axillary or pectoral adenopathy.     Left upper body: No supraclavicular, axillary or pectoral adenopathy.  Skin:    Findings: No erythema or rash.  Neurological:     Mental Status: She is alert.     Lab Results  Component Value Date   WBC 5.6 08/24/2017   HGB 13.1 08/24/2017   HCT 38.8 08/24/2017   PLT 327.0 08/24/2017   GLUCOSE 101 (H) 08/24/2017   CHOL 138 08/24/2017   TRIG 90.0 08/24/2017   HDL 53.70 08/24/2017   LDLCALC 66 08/24/2017   ALT 9 08/24/2017   AST 10 08/24/2017   NA 139 08/24/2017   K 4.0 08/24/2017   CL 105 08/24/2017   CREATININE 0.72 08/24/2017   BUN 14 08/24/2017   CO2 26 08/24/2017   TSH 1.07 08/24/2017   INR 0.99 07/17/2014   HGBA1C 6.3 08/24/2017   MICROALBUR 0.6 10/25/2015    Assessment & Plan:   Kimberly Underwood was seen today for follow-up, cyst and arm pain.  Diagnoses and all orders for this visit:  Essential hypertension, benign -     Basic metabolic panel  Vitamin D deficiency -     Vitamin D, Ergocalciferol, 50 MCG (2000 UT) CAPS; Take 1 tablet by mouth at bedtime.  Hyperglycemia -     Hemoglobin A1c  Tendonitis of wrist, right -     Discontinue: Ibuprofen-Famotidine (DUEXIS) 800-26.6 MG TABS; Take 1 tablet by mouth every 12 (twelve) hours. -     predniSONE (STERAPRED UNI-PAK 21 TAB) 10 MG (21) TBPK tablet; As directed on package -     Ibuprofen-Famotidine (DUEXIS) 800-26.6 MG TABS; Take 1 tablet by mouth every 12 (twelve) hours as needed (with food).  Folliculitis barbae   I have discontinued Kimberly Underwood. Kimberly Underwood's Diclofenac Sodium and phentermine. I have also changed her Vitamin D (Ergocalciferol) and Ibuprofen-Famotidine. Additionally, I am having her start on predniSONE. Lastly, I am having her maintain her Melatonin and lisinopril-hydrochlorothiazide.  Meds ordered this encounter  Medications  . Vitamin D, Ergocalciferol, 50 MCG (2000 UT) CAPS    Sig: Take 1 tablet by mouth at bedtime.    Dispense:  90 capsule     Refill:  3    Order Specific Question:   Supervising Provider    Answer:   MATTHEWS, CODY [[id number]]  . DISCONTD: Ibuprofen-Famotidine (DUEXIS) 800-26.6 MG TABS    Sig: Take 1 tablet by mouth every 12 (twelve) hours.    Dispense:  8 tablet    Refill:  0    Order Specific Question:   Supervising Provider    Answer:   MATTHEWS, CODY [[id number]]  . predniSONE (STERAPRED UNI-PAK 21 TAB) 10 MG (21) TBPK tablet    Sig: As directed on package    Dispense:  21 tablet  Refill:  0    Order Specific Question:   Supervising Provider    Answer:   MATTHEWS, CODY [[id number]]  . Ibuprofen-Famotidine (DUEXIS) 800-26.6 MG TABS    Sig: Take 1 tablet by mouth every 12 (twelve) hours as needed (with food).    Dispense:  8 tablet    Refill:  0    Order Specific Question:   Supervising Provider    Answer:   MATTHEWS, CODY [[id number]]    Problem List Items Addressed This Visit      Cardiovascular and Mediastinum   Essential hypertension, benign - Primary   Relevant Orders   Basic metabolic panel    Other Visit Diagnoses    Vitamin D deficiency       Relevant Medications   Vitamin D, Ergocalciferol, 50 MCG (2000 UT) CAPS   Hyperglycemia       Relevant Orders   Hemoglobin A1c   Tendonitis of wrist, right       Relevant Medications   predniSONE (STERAPRED UNI-PAK 21 TAB) 10 MG (21) TBPK tablet   Ibuprofen-Famotidine (DUEXIS) 800-26.6 MG TABS   Folliculitis barbae           Follow-up: Return in about 3 months (around 10/22/2018) for HTN.  Alysia Pennaharlotte Elhadji Pecore, NP

## 2018-07-23 NOTE — Patient Instructions (Addendum)
Go to lab for blood draw.  Use wrist brace daily for 2weeks.  Call office if no improvement in 2weeks.  Do wrist exercise once a day, 10 repetition, apply cold compress after completion.  Wrist and Forearm Exercises Ask your health care provider which exercises are safe for you. Do exercises exactly as told by your health care provider and adjust them as directed. It is normal to feel mild stretching, pulling, tightness, or discomfort as you do these exercises, but you should stop right away if you feel sudden pain or your pain gets worse. Do not begin these exercises until told by your health care provider. Range of motion exercises These exercises warm up your muscles and joints and improve the movement and flexibility of your injured wrist and forearm. These exercises also help to relieve pain, numbness, and tingling. These exercises are done using the muscles in your injured wrist and forearm. Exercise A: Wrist flexion, active 1. With your fingers relaxed, bend your wrist forward as far as you can. 2. Hold this position for __________ seconds. Repeat __________ times. Complete this exercise __________ times a day. Exercise B: Wrist extension, active 1. With your fingers relaxed, bend your wrist backward as far as you can. 2. Hold this position for __________ seconds. Repeat __________ times. Complete this exercise __________ times a day. Exercise C: Supination, active  1. Stand or sit with your arms at your sides. 2. Bend your left / right elbow to an "L" shape (90 degrees). 3. Turn your palm upward until you feel a gentle stretch on the inside of your forearm. 4. Hold this position for __________ seconds. 5. Slowly return your palm to the starting position. Repeat __________ times. Complete this exercise __________ times a day. Exercise D: Pronation, active  1. Stand or sit with your arms at your sides. 2. Bend your left / right elbow to an "L" shape (90 degrees). 3. Turn your  palm downward until you feel a gentle stretch on the top of your forearm. 4. Hold this position for __________ seconds. 5. Slowly return your palm to the starting position. Repeat __________ times. Complete this exercise __________ times a day. Stretching These exercises warm up your muscles and joints and improve the movement and flexibility of your injured wrist and forearm. These exercises also help to relieve pain, numbness, and tingling. These exercises are done using your healthy wrist and forearm to help stretch the muscles in your injured wrist and forearm. Exercise E: Wrist flexion, passive  1. Extend your left / right arm in front of you, relax your wrist, and point your fingers downward. 2. Gently push on the back of your hand. Stop when you feel a gentle stretch on the top of your forearm. 3. Hold this position for __________ seconds. Repeat __________ times. Complete this exercise __________ times a day. Exercise F: Wrist extension, passive  1. Extend your left / right arm in front of you and turn your palm upward. 2. Gently pull your palm and fingertips back so your fingers point downward. You should feel a gentle stretch on the palm-side of your forearm. 3. Hold this position for __________ seconds. Repeat __________ times. Complete this exercise __________ times a day. Exercise G: Forearm rotation, supination, passive 1. Sit with your left / right elbow bent to an "L" shape (90 degrees) with your forearm resting on a table. 2. Keeping your upper body and shoulder still, use your other hand to rotate your forearm palm-up until you feel  a gentle to moderate stretch. 3. Hold this position for __________ seconds. 4. Slowly release the stretch and return to the starting position. Repeat __________ times. Complete this exercise __________ times a day. Exercise H: Forearm rotation, pronation, passive 1. Sit with your left / right elbow bent to an "L" shape (90 degrees) with your  forearm resting on a table. 2. Keeping your upper body and shoulder still, use your other hand to rotate your forearm palm-down until you feel a gentle to moderate stretch. 3. Hold this position for __________ seconds. 4. Slowly release the stretch and return to the starting position. Repeat __________ times. Complete this exercise __________ times a day. Strengthening exercises These exercises build strength and endurance in your wrist and forearm. Endurance is the ability to use your muscles for a long time, even after they get tired. Exercise I: Wrist flexors  1. Sit with your left / right forearm supported on a table and your hand resting palm-up over the edge of the table. Your elbow should be bent to an "L" shape (about 90 degrees) and be below the level of your shoulder. 2. Hold a __________ weight in your left / right hand. Or, hold a rubber exercise band or tube in both hands, keeping your hands at the same level and hip distance apart. There should be a slight tension in the exercise band or tube. 3. Slowly curl your hand up toward your forearm. 4. Hold this position for __________ seconds. 5. Slowly lower your hand back to the starting position. Repeat __________ times. Complete this exercise __________ times a day. Exercise J: Wrist extensors  1. Sit with your left / right forearm supported on a table and your hand resting palm-down over the edge of the table. Your elbow should be bent to an "L" shape (about 90 degrees) and be below the level of your shoulder. 2. Hold a __________ weight in your left / right hand. Or, hold a rubber exercise band or tube in both hands, keeping your hands at the same level and hip distance apart. There should be a slight tension in the exercise band or tube. 3. Slowly curl your hand up toward your forearm. 4. Hold this position for __________ seconds. 5. Slowly lower your hand back to the starting position. Repeat __________ times. Complete this  exercise __________ times a day. Exercise K: Forearm rotation, supination  1. Sit with your left / right forearm supported on a table and your hand resting palm-down. Your elbow should be at your side, bent to an "L" shape (about 90 degrees), and below the level of your shoulder. Keep your wrist stable and in a neutral position throughout the exercise. 2. Gently hold a lightweight hammer with your left / right hand. 3. Without moving your elbow or wrist, slowly rotate your palm upward to a thumbs-up position. 4. Hold this position for __________ seconds. 5. Slowly return your forearm to the starting position. Repeat __________ times. Complete this exercise __________ times a day. Exercise L: Forearm rotation, pronation  1. Sit with your left / right forearm supported on a table and your hand resting palm-up. Your elbow should be at your side, bent to an "L" shape (about 90 degrees), and below the level of your shoulder. Keep your wrist stable. Do not allow it to move backward or forward during the exercise. 2. Gently hold a lightweight hammer with your left / right hand. 3. Without moving your elbow or wrist, slowly rotate your palm and  hand upward to a thumbs-up position. 4. Hold this position for __________ seconds. 5. Slowly return your forearm to the starting position. Repeat __________ times. Complete this exercise __________ times a day. Exercise M: Grip strengthening  1. Hold one of these items in your left / right hand: play dough, therapy putty, a dense sponge, a stress ball, or a large, rolled sock. 2. Squeeze as hard as you can without increasing pain. 3. Hold this position for __________ seconds. 4. Slowly release your grip. Repeat __________ times. Complete this exercise __________ times a day. This information is not intended to replace advice given to you by your health care provider. Make sure you discuss any questions you have with your health care provider. Document  Released: 02/05/2005 Document Revised: 07/28/2017 Document Reviewed: 12/17/2014 Elsevier Interactive Patient Education  2019 [employer]   Wrist Pain, Adult There are many things that can cause wrist pain. Some common causes include:  An injury to the wrist area.  Overuse of the joint.  A condition that causes too much pressure to be put on a nerve in the wrist (carpal tunnel syndrome).  Wear and tear of the joints that happens as a person gets older (osteoarthritis).  Other types of arthritis. Sometimes, the cause of wrist pain is not known. Often, the pain goes away when you follow your doctors instructions for helping pain at home, such as resting or icing your wrist. If your wrist pain does not go away, it is important to tell your doctor. Follow these instructions at home:  Rest the wrist area for 48 hours or more, or as long as told by your doctor.  If a splint or elastic bandage has been put on your wrist, use it as told by your doctor. ? Take off the splint or bandage only as told by your doctor. ? Loosen the splint or bandage if your fingers tingle, lose feeling (get numb), or turn cold or blue.  If directed, apply ice to the injured area: ? If you have a removable splint or elastic bandage, remove it as told by your doctor. ? Put ice in a plastic bag. ? Place a towel between your skin and the bag or between your splint or bandage and the bag. ? Leave the ice on for 20 minutes, 2-3 times a day.   Keep your arm raised (elevated) above the level of your heart while you are sitting or lying down.  Take over-the-counter and prescription medicines only as told by your doctor.  Keep all follow-up visits as told by your doctor. This is important. Contact a doctor if:  You have a sudden sharp pain in the wrist, hand, or arm that is different or new.  The swelling or bruising on your wrist or hand gets worse.  Your skin becomes red, gets a rash, or has open  sores.  Your pain does not get better or it gets worse. Get help right away if:  You lose feeling in your fingers or hand.  Your fingers turn white, very red, or cold and blue.  You cannot move your fingers.  You have a fever or chills. This information is not intended to replace advice given to you by your health care provider. Make sure you discuss any questions you have with your health care provider. Document Released: 09/10/2007 Document Revised: 10/18/2015 Document Reviewed: 10/11/2015 Elsevier Interactive Patient Education  2019 [employer] Inc.   Folliculitis  Folliculitis is inflammation of the hair follicles. Folliculitis most commonly  occurs on the scalp, thighs, legs, back, and buttocks. However, it can occur anywhere on the body. What are the causes? This condition may be caused by:  A bacterial infection (common).  A fungal infection.  A viral infection.  Coming into contact with certain chemicals, especially oils and tars.  Shaving or waxing.  Applying greasy ointments or creams to your skin often. Long-lasting folliculitis and folliculitis that keeps coming back can be caused by bacteria that live in the nostrils. What increases the risk? This condition is more likely to develop in people with:  A weakened immune system.  Diabetes.  Obesity. What are the signs or symptoms? Symptoms of this condition include:  Redness.  Soreness.  Swelling.  Itching.  Small white or yellow, pus-filled, itchy spots (pustules) that appear over a reddened area. If there is an infection that goes deep into the follicle, these may develop into a boil (furuncle).  A group of closely packed boils (carbuncle). These tend to form in hairy, sweaty areas of the body. How is this diagnosed? This condition is diagnosed with a skin exam. To find what is causing the condition, your health care provider may take a sample of one of the pustules or boils for testing. How is this  treated? This condition may be treated by:  Applying warm compresses to the affected areas.  Taking an antibiotic medicine or applying an antibiotic medicine to the skin.  Applying or bathing with an antiseptic solution.  Taking an over-the-counter medicine to help with itching.  Having a procedure to drain any pustules or boils. This may be done if a pustule or boil contains a lot of pus or fluid.  Laser hair removal. This may be done to treat long-lasting folliculitis. Follow these instructions at home:  If directed, apply heat to the affected area as often as told by your health care provider. Use the heat source that your health care provider recommends, such as a moist heat pack or a heating pad. ? Place a towel between your skin and the heat source. ? Leave the heat on for 20-30 minutes. ? Remove the heat if your skin turns bright red. This is especially important if you are unable to feel pain, heat, or cold. You may have a greater risk of getting burned.  If you were prescribed an antibiotic medicine, use it as told by your health care provider. Do not stop using the antibiotic even if you start to feel better.  Take over-the-counter and prescription medicines only as told by your health care provider.  Do not shave irritated skin.  Keep all follow-up visits as told by your health care provider. This is important. Get help right away if:  You have more redness, swelling, or pain in the affected area.  Red streaks are spreading from the affected area.  You have a fever. This information is not intended to replace advice given to you by your health care provider. Make sure you discuss any questions you have with your health care provider. Document Released: 06/02/2001 Document Revised: 10/12/2015 Document Reviewed: 01/12/2015 Elsevier Interactive Patient Education  2019 [employer].

## 2018-07-24 LAB — HEMOGLOBIN A1C
Hgb A1c MFr Bld: 6.2 % of total Hgb — ABNORMAL HIGH (ref <5.7)
Mean Plasma Glucose: 131 (calc)
eAG (mmol/L): 7.3 (calc)

## 2018-07-24 LAB — BASIC METABOLIC PANEL
BUN: 12 mg/dL (ref 7–25)
CO2: 24 mmol/L (ref 20–32)
Calcium: 9.2 mg/dL (ref 8.6–10.4)
Chloride: 102 mmol/L (ref 98–110)
Creat: 0.84 mg/dL (ref 0.50–1.05)
Glucose, Bld: 104 mg/dL — ABNORMAL HIGH (ref 65–99)
Potassium: 4 mmol/L (ref 3.5–5.3)
Sodium: 137 mmol/L (ref 135–146)

## 2018-07-24 LAB — SPECIMEN COMPROMISED

## 2018-07-26 ENCOUNTER — Telehealth: Payer: Self-pay

## 2018-07-26 MED ORDER — LISINOPRIL-HYDROCHLOROTHIAZIDE 20-12.5 MG PO TABS: 1.0000 | ORAL_TABLET | Freq: Every day | ORAL | 3 refills | Status: DC

## 2018-07-26 NOTE — Telephone Encounter (Signed)
Returned pt call and pt was given lab results and verbalized understanding.  Copied from CRM [id number]. Topic: Quick Communication - See Telephone Encounter >> Jul 26, 2018  1:32 PM Jens Som A wrote: CRM for notification. See Telephone encounter for: 07/26/18  Patient is returning Danielle's call for lab results CB- [phone] (H)

## 2018-07-26 NOTE — Addendum Note (Signed)
Addended by: Alysia Penna L on: 07/26/2018 12:29 PM   Modules accepted: Orders

## 2018-08-20 ENCOUNTER — Ambulatory Visit: Payer: Self-pay | Admitting: *Deleted

## 2018-08-20 NOTE — Telephone Encounter (Signed)
Please contact patient and offer appointment with Dr. Salena Saner this afternoon

## 2018-08-20 NOTE — Telephone Encounter (Signed)
Pt called with complaints of feeling feverish and cold symptoms (runny nose, NP cough); she says that she feels achy, and can not taste or smell; her symptoms started on 08/18/2018;she is concerned that she has COVID; the pt just returned to work 08/17/18 and has been out since 07/07/2018 due to closure; she works in a lab; she has not taken her temp because she does not have a thermometer; recommendations made per nurse triage protocol; she verbalizes understanding; phone disconnected; attempted to call pt back; left message on voicemail for pt to expect call back from office; spoke with Tonya at [facility]; she will contact the pt for scheduling. Reason for Disposition . HIGH RISK patient (e.g., age > 64 years, diabetes, heart or lung disease, weak immune system)  Answer Assessment - Initial Assessment Questions 1. COVID-19 DIAGNOSIS: "Who made your Coronavirus (COVID-19) diagnosis?" "Was it confirmed by a positive lab test?" If not diagnosed by a HCP, ask "Are there lots of cases (community spread) where you live?" (See public health department website, if unsure)   * MAJOR community spread: high number of cases; numbers of cases are increasing; many people hospitalized.   * MINOR community spread: low number of cases; not increasing; few or no people hospitalized     major 2. ONSET: "When did the COVID-19 symptoms start?"     08/18/2018 3. WORST SYMPTOM: "What is your worst symptom?" (e.g., cough, fever, shortness of breath, muscle aches)     achiness 4. COUGH: "Do you have a cough?" If so, ask: "How bad is the cough?"       mild 5. FEVER: "Do you have a fever?" If so, ask: "What is your temperature, how was it measured, and when did it start?"     Feels feverish; not taken temp 6. RESPIRATORY STATUS: "Describe your breathing?" (e.g., shortness of breath, wheezing, unable to speak)      no 7. BETTER-SAME-WORSE: "Are you getting better, staying the same or getting worse compared to yesterday?"  If  getting worse, ask, "In what way?"     The same 8. HIGH RISK DISEASE: "Do you have any chronic medical problems?" (e.g., asthma, heart or lung disease, weak immune system, etc.)     High blood pressure 9. PREGNANCY: "Is there any chance you are pregnant?" "When was your last menstrual period?"    No hyterectomy 10. OTHER SYMPTOMS: "Do you have any other symptoms?"  (e.g., runny nose, headache, sore throat, loss of smell)       Runny nose, loss of taste and smell, chills  Protocols used: CORONAVIRUS (COVID-19) DIAGNOSED OR SUSPECTED-A-AH

## 2018-08-20 NOTE — Telephone Encounter (Signed)
CN-Plz see note below/pt C/O feeling feverish and chilled/runny nose/nonproductive cough/achy/no smell or taste/Sx started on 5.13.20/does not have a thermometer/I do not see where pt was scheduled or any notation that she was contacted/plz advise what you would like her to do/thx dmf

## 2018-08-23 NOTE — Telephone Encounter (Signed)
DP-Do you remember [place] discussing this pt on Friday before I left? You had said that  Kimberly Underwood was trying to get her scheduled with [facility]? Did you talk with the patient? Plz advise/thx dmf

## 2018-08-23 NOTE — Telephone Encounter (Signed)
Kimberly Underwood couldn't get in touch with her then Kimberly Underwood also said she was unreachable. I called her today 08/23/2018 left VM to call back to see If she needed an appointment still. Unable to reach.

## 2018-09-27 ENCOUNTER — Encounter: Payer: Self-pay | Admitting: Nurse Practitioner

## 2018-09-27 DIAGNOSIS — I10 Essential (primary) hypertension: Secondary | ICD-10-CM

## 2018-09-27 MED ORDER — OLMESARTAN MEDOXOMIL-HCTZ 20-12.5 MG PO TABS: 1.0000 | ORAL_TABLET | Freq: Every day | ORAL | 1 refills | Status: DC

## 2018-09-27 NOTE — Telephone Encounter (Signed)
Spoke with the pharmacy and they stated this medication has been on back order for a while now and they do not know when they will have it back in stock.   Pt is aware that Lisinopril-hydrochlorothiazide has been on back order but she has 3 pills left. [place] please advise.

## 2019-02-09 DIAGNOSIS — Z803 Family history of malignant neoplasm of breast: Secondary | ICD-10-CM | POA: Diagnosis not present

## 2019-02-09 DIAGNOSIS — Z1231 Encounter for screening mammogram for malignant neoplasm of breast: Secondary | ICD-10-CM | POA: Diagnosis not present

## 2019-02-09 LAB — HM MAMMOGRAPHY

## 2019-02-11 ENCOUNTER — Encounter: Payer: Self-pay | Admitting: Nurse Practitioner

## 2019-02-11 NOTE — Progress Notes (Signed)
Abstracted result and sent to scan  

## 2019-04-04 ENCOUNTER — Other Ambulatory Visit: Payer: Self-pay | Admitting: Nurse Practitioner

## 2019-04-04 DIAGNOSIS — I10 Essential (primary) hypertension: Secondary | ICD-10-CM

## 2019-04-04 MED ORDER — OLMESARTAN MEDOXOMIL-HCTZ 20-12.5 MG PO TABS: 1.0000 | ORAL_TABLET | Freq: Every day | ORAL | 0 refills | Status: DC

## 2019-04-04 NOTE — Telephone Encounter (Signed)
Copied from [place] [id number]. Topic: Quick Communication - Rx Refill/Question >> Apr 04, 2019  9:03 AM Leward Quan A wrote: Medication: olmesartan-hydrochlorothiazide (BENICAR HCT) 20-12.5 MG tablet  Has the patient contacted their pharmacy? Yes.   (Agent: If no, request that the patient contact the pharmacy for the refill.) (Agent: If yes, when and what did the pharmacy advise?)  Preferred Pharmacy (with phone number or street name): [id number] - [place], [place]  Phone:  [phone] Fax:  [phone]     Agent: Please be advised that RX refills may take up to 3 business days. We ask that you follow-up with your pharmacy.

## 2019-04-04 NOTE — Telephone Encounter (Signed)
LVM for the pt to call back, need to offer virtual visit for BP follow up with [facility].   90 days supply sent.

## 2019-04-04 NOTE — Telephone Encounter (Signed)
Requested medication (s) are due for refill today: yes  Requested medication (s) are on the active medication list: yes  Last refill:  09/27/2018  Future visit scheduled: no  Notes to clinic:  no valid encounter in last 6 months    Requested Prescriptions  Pending Prescriptions Disp Refills   olmesartan-hydrochlorothiazide (BENICAR HCT) 20-12.5 MG tablet 90 tablet 1    Sig: Take 1 tablet by mouth daily.      Cardiovascular: ARB + Diuretic Combos Failed - 04/04/2019  9:07 AM      Failed - K in normal range and within 180 days    Potassium  Date Value Ref Range Status  07/23/2018 4.0 3.5 - 5.3 mmol/L Final          Failed - Na in normal range and within 180 days    Sodium  Date Value Ref Range Status  07/23/2018 137 135 - 146 mmol/L Final          Failed - Cr in normal range and within 180 days    Creat  Date Value Ref Range Status  07/23/2018 0.84 0.50 - 1.05 mg/dL Final    Comment:    For patients >64 years of age, the reference limit for Creatinine is approximately 13% higher for people identified as African-American. .    Creatinine, Urine  Date Value Ref Range Status  10/25/2015 244 20 - 320 mg/dL Final          Failed - Ca in normal range and within 180 days    Calcium  Date Value Ref Range Status  07/23/2018 9.2 8.6 - 10.4 mg/dL Final   Calcium, Ion  Date Value Ref Range Status  10/07/2008 1.19 1.12 - 1.32 mmol/L Final          Failed - Valid encounter within last 6 months    Recent Outpatient Visits           8 months ago Essential hypertension, benign   [facility], Charlene Brooke, NP   1 year ago Essential hypertension, benign   [facility], Charlene Brooke, NP   1 year ago Encounter for preventative adult health care exam with abnormal findings   [facility], Charlene Brooke, NP   1 year ago Chronic pain of left knee   [facility],  Charlene Brooke, NP   2 years ago Right elbow pain   [place], [place], [place] - Patient is not pregnant      Passed - Last BP in normal range    BP Readings from Last 1 Encounters:  07/23/18 124/76

## 2019-04-07 DIAGNOSIS — Z20828 Contact with and (suspected) exposure to other viral communicable diseases: Secondary | ICD-10-CM | POA: Diagnosis not present

## 2019-04-17 DIAGNOSIS — Z20828 Contact with and (suspected) exposure to other viral communicable diseases: Secondary | ICD-10-CM | POA: Diagnosis not present

## 2019-06-17 DIAGNOSIS — N3281 Overactive bladder: Secondary | ICD-10-CM | POA: Diagnosis not present

## 2019-06-17 DIAGNOSIS — Z01411 Encounter for gynecological examination (general) (routine) with abnormal findings: Secondary | ICD-10-CM | POA: Diagnosis not present

## 2019-06-17 DIAGNOSIS — N898 Other specified noninflammatory disorders of vagina: Secondary | ICD-10-CM | POA: Diagnosis not present

## 2019-06-17 DIAGNOSIS — Z113 Encounter for screening for infections with a predominantly sexual mode of transmission: Secondary | ICD-10-CM | POA: Diagnosis not present

## 2019-08-29 ENCOUNTER — Other Ambulatory Visit: Payer: Self-pay

## 2019-08-29 ENCOUNTER — Encounter: Payer: Self-pay | Admitting: Nurse Practitioner

## 2019-08-29 ENCOUNTER — Ambulatory Visit (INDEPENDENT_AMBULATORY_CARE_PROVIDER_SITE_OTHER): Payer: 59 | Admitting: Nurse Practitioner

## 2019-08-29 VITALS — BP 120/80 | HR 95 | Temp 97.5°F | Ht 67.5 in | Wt 255.0 lb

## 2019-08-29 DIAGNOSIS — Z13220 Encounter for screening for lipoid disorders: Secondary | ICD-10-CM | POA: Diagnosis not present

## 2019-08-29 DIAGNOSIS — Z0001 Encounter for general adult medical examination with abnormal findings: Secondary | ICD-10-CM | POA: Diagnosis not present

## 2019-08-29 DIAGNOSIS — M1000 Idiopathic gout, unspecified site: Secondary | ICD-10-CM | POA: Diagnosis not present

## 2019-08-29 DIAGNOSIS — Z136 Encounter for screening for cardiovascular disorders: Secondary | ICD-10-CM | POA: Diagnosis not present

## 2019-08-29 DIAGNOSIS — I10 Essential (primary) hypertension: Secondary | ICD-10-CM

## 2019-08-29 DIAGNOSIS — Z6839 Body mass index (BMI) 39.0-39.9, adult: Secondary | ICD-10-CM

## 2019-08-29 DIAGNOSIS — R739 Hyperglycemia, unspecified: Secondary | ICD-10-CM | POA: Diagnosis not present

## 2019-08-29 MED ORDER — OLMESARTAN MEDOXOMIL-HCTZ 20-12.5 MG PO TABS: 1.0000 | ORAL_TABLET | Freq: Every day | ORAL | 1 refills | Status: DC

## 2019-08-29 NOTE — Progress Notes (Signed)
Subjective:    Patient ID: Kimberly Underwood, female    DOB: February 25, 1966, 54 y.o.   MRN: [id number]  Patient presents today for complete physical and eval of chronic conditions  HPI HTN: Chronic LE edema, admits to being non compliant with diet BP at goal today Current use of olmesartan/HCTZ BP Readings from Last 3 Encounters:  08/29/19 120/80  07/23/18 124/76  11/20/17 120/78   Obesity: BMI of 39 Has difficulty to maintain heart healthy diet, portion control, and daily exercise. Admits to emotional eating. All her meals come from a restaurant. She knows how to cook but chooses to eat out. Reports chronic knee and back pain due to weight gain Last HgbA1c of 6.2 Normal lipid panel Wt Readings from Last 3 Encounters:  08/29/19 255 lb (115.7 kg)  07/23/18 248 lb 3.2 oz (112.6 kg)  11/20/17 251 lb 12.8 oz (114.2 kg)   Sexual History (orientation,birth control, marital status, STD):breast and pelvic exam done by GYN per patient. Last mammogram 02/2019 (normal) Up to date with colonoscopy.  Depression/Suicide: Depression screen [facility] 2/9 07/23/2018 11/20/2017 02/16/2017 12/18/2016 04/22/2016 10/25/2015  Decreased Interest 0 0 0 0 0 0  Down, Depressed, Hopeless 0 0 0 0 0 0  PHQ - 2 Score 0 0 0 0 0 0   Vision:up to date  Dental:up to date  Immunizations: (TDAP, Hep C screen, Pneumovax, Influenza, zoster)  Health Maintenance  Topic Date Due  . Flu Shot  11/06/2019  . Mammogram  02/08/2021  . Tetanus Vaccine  06/08/2021  . Colon Cancer Screening  12/04/2025  . COVID-19 Vaccine  Completed  . HIV Screening  Completed   Diet:regular  Weight:  Wt Readings from Last 3 Encounters:  08/29/19 255 lb (115.7 kg)  07/23/18 248 lb 3.2 oz (112.6 kg)  11/20/17 251 lb 12.8 oz (114.2 kg)    Fall Risk: Fall Risk  07/23/2018 11/20/2017 02/16/2017 12/18/2016 04/22/2016  Falls in the past year? 0 No No No No   Medications and allergies reviewed with patient and updated if appropriate.  Patient  Active Problem List   Diagnosis Date Noted  . Degenerative arthritis of knee, bilateral 03/09/2017  . Right lateral epicondylitis 03/09/2017  . Encounter for health maintenance examination in adult 10/25/2015  . Microscopic hematuria 10/25/2015  . History of bronchitis 10/25/2015  . Overactive bladder 09/26/2014  . Anxiety 09/26/2014  . Arthritis   . Gout   . Essential hypertension, benign 12/27/2013  . Obesity (BMI 30-39.9) 12/27/2013  . Cervical dystonia 11/15/2012  . CARPAL TUNNEL SYNDROME, BILATERAL 05/22/2010    Current Outpatient Medications on File Prior to Visit  Medication Sig Dispense Refill  . Melatonin 5 MG TABS Take 1-2 tablets (5-10 mg total) by mouth daily.  0  . Vitamin D, Ergocalciferol, 50 MCG (2000 UT) CAPS Take 1 tablet by mouth at bedtime. 90 capsule 3   No current facility-administered medications on file prior to visit.    Past Medical History:  Diagnosis Date  . Anxiety   . Arthritis 2014  . CARPAL TUNNEL SYNDROME, BILATERAL 05/22/2010  . Dental caries   . Gout   . History of CT scan of head 07/2014   due to syncope, normal scan  . History of echocardiogram 07/17/14   TTE, normal LV function, 55-60% EF  . History of EKG 07/2014   PVCs, otherwise normal  . History of nuclear stress test 07/2014   normal  . HYPERTENSION 10/25/2008  . Impaired fasting blood sugar   .  Normal cardiac stress test 07/17/14   Dr. Peter [place]  . Overactive bladder   . Vitamin D deficiency   . Wears glasses     Past Surgical History:  Procedure Laterality Date  . ABDOMINAL HYSTERECTOMY  09/2010   fibroid, partial hysterectomy   . TOENAIL EXCISION Bilateral 2000's   "big toes" (12/23/2012)  . WISDOM TOOTH EXTRACTION  ~ 1985    Social History   Socioeconomic History  . Marital status: Single    Spouse name: Not on file  . Number of children: Not on file  . Years of education: Not on file  . Highest education level: Not on file  Occupational History  . Occupation:  Systems analyst  . 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 Smoking status: Never Smoker  . Smokeless tobacco: Never Used  Substance and Sexual Activity  . Alcohol use: Yes    Comment: social  . Drug use: No  . Sexual activity: Yes    Birth control/protection: Surgical  Other Topics Concern  . Not on file  Social History Narrative   Single, 2 children, ages 22yo son, daughter age 53yo, exercise - none.  Works in Theatre stage manager at [employer].   As of 10/2015   Social Determinants of Health   Financial Resource Strain:   . Difficulty of Paying Living Expenses:   Food Insecurity:   . Worried About Programme researcher, broadcasting/film/video in the Last Year:   . Barista in the Last Year:   Transportation Needs:   . Freight forwarder (Medical):   Marland Kitchen Lack of Transportation (Non-Medical):   Physical Activity:   . Days of Exercise per Week:   . Minutes of Exercise per Session:   Stress:   . Feeling of Stress :   Social Connections:   . Frequency of Communication with Friends and Family:   . Frequency of Social Gatherings with Friends and Family:   . Attends Religious Services:   . Active Member of Clubs or Organizations:   . Attends Banker Meetings:   Marland Kitchen Marital Status:     Family History  Problem Relation Age of Onset  . Hypertension Father   . Gout Father   . Clotting disorder Father   . Lung disease Father        listeria  . Stroke Father   . Arthritis Father   . Heart disease Father   . Cancer Mother        breast cancer  . Migraines Sister   . Hypertension Sister   . Mental illness Sister   . Stroke Maternal Grandmother   . Cancer Maternal Grandfather        lung cancer (smoking)  . Heart disease Daughter        heart murmur  . Diabetes Neg Hx         Review of Systems  Constitutional: Negative for fever, malaise/fatigue and weight loss.  HENT: Negative for congestion and sore throat.   Eyes:        Negative for visual changes  Respiratory: Negative for cough and shortness of breath.   Cardiovascular: Negative for chest pain, palpitations and leg swelling.  Gastrointestinal: Negative for blood in stool, constipation, diarrhea and heartburn.  Genitourinary: Negative for dysuria, frequency and urgency.  Musculoskeletal: Negative for falls, joint pain and myalgias.  Skin: Negative for rash.  Neurological: Negative for dizziness, sensory change and headaches.  Endo/Heme/Allergies: Does not bruise/bleed easily.  Psychiatric/Behavioral: Negative for depression, substance abuse and suicidal ideas. The patient is not nervous/anxious.     Objective:   Vitals:   08/29/19 1402  BP: 120/80  Pulse: 95  Temp: (!) 97.5 F (36.4 C)  SpO2: 98%   Body mass index is 39.35 kg/m.  Physical Examination:  Physical Exam Vitals reviewed.  Constitutional:      General: She is not in acute distress.    Appearance: She is well-developed. She is obese.  HENT:     Right Ear: Tympanic membrane, ear canal and external ear normal.     Left Ear: Tympanic membrane, ear canal and external ear normal.     Mouth/Throat:     Pharynx: No oropharyngeal exudate.  Eyes:     Conjunctiva/sclera: Conjunctivae normal.     Pupils: Pupils are equal, round, and reactive to light.  Cardiovascular:     Rate and Rhythm: Normal rate and regular rhythm.     Pulses: Normal pulses.     Heart sounds: Normal heart sounds.  Pulmonary:     Effort: Pulmonary effort is normal. No respiratory distress.     Breath sounds: Normal breath sounds.  Chest:     Chest wall: No tenderness.  Abdominal:     General: Bowel sounds are normal.     Palpations: Abdomen is soft.  Genitourinary:    Comments: Deferred breast and pelvic exam to GYN per patient Musculoskeletal:        General: Normal range of motion.     Cervical back: Normal range of motion and neck supple.     Right lower leg: Edema present.     Left lower leg: Edema  present.  Lymphadenopathy:     Cervical: No cervical adenopathy.  Skin:    General: Skin is warm and dry.  Neurological:     Mental Status: She is alert and oriented to person, place, and time.     Deep Tendon Reflexes: Reflexes are normal and symmetric.  Psychiatric:        Mood and Affect: Mood normal.        Behavior: Behavior normal.        Thought Content: Thought content normal.     ASSESSMENT and PLAN: This visit occurred during the SARS-CoV-2 public health emergency.  Safety protocols were in place, including screening questions prior to the visit, additional usage of staff PPE, and extensive cleaning of exam room while observing appropriate contact time as indicated for disinfecting solutions.   Kimberly Underwood was seen today for annual exam.  Diagnoses and all orders for this visit:  Encounter for preventative adult health care exam with abnormal findings -     Cancel: CBC -     Cancel: Lipid panel -     Cancel: TSH -     Cancel: Comprehensive metabolic panel -     CBC; Future -     Comprehensive metabolic panel; Future -     Lipid panel; Future -     TSH; Future  Essential hypertension, benign -     olmesartan-hydrochlorothiazide (BENICAR HCT) 20-12.5 MG tablet; Take 1 tablet by mouth daily.  Hyperglycemia -     Cancel: Hemoglobin A1c -     Amb Ref to Medical Weight Management -     Hemoglobin A1c; Future  Encounter for lipid screening for cardiovascular disease -     Cancel: Lipid panel -     Lipid panel; Future  Idiopathic gout, unspecified  chronicity, unspecified site -     Cancel: Uric acid -     Uric acid; Future  Class 2 severe obesity due to excess calories with serious comorbidity and body mass index (BMI) of 39.0 to 39.9 in adult [facility]) -     Amb Ref to Medical Weight Management  Schedule lab appt (need to be fasting at least 4-6hrs prior to blood draw, ok to drink water) You are due to mammogram in November. Next colonoscopy is due 2027 Start DASH diet  and daily exercise (walking 30-49mins a day) You will be contacted to schedule appt with weight loss clinic Will complete insurance form once I get lab results.  No problem-specific Assessment & Plan notes found for this encounter.     Problem List Items Addressed This Visit      Cardiovascular and Mediastinum   Essential hypertension, benign   Relevant Medications   olmesartan-hydrochlorothiazide (BENICAR HCT) 20-12.5 MG tablet     Other   Encounter for health maintenance examination in adult - Primary   Gout   Relevant Orders   Uric acid    Other Visit Diagnoses    Hyperglycemia       Relevant Orders   Amb Ref to Medical Weight Management   Hemoglobin A1c   Encounter for lipid screening for cardiovascular disease       Relevant Orders   Lipid panel   Class 2 severe obesity due to excess calories with serious comorbidity and body mass index (BMI) of 39.0 to 39.9 in adult [facility])       Relevant Orders   Amb Ref to Medical Weight Management      Follow up: Return in about 3 months (around 11/29/2019) for HTN and hyperglycemia.  Alysia Penna, NP

## 2019-08-29 NOTE — Patient Instructions (Addendum)
Schedule lab appt (need to be fasting at least 4-6hrs prior to blood draw, ok to drink water)  You are due to mammogram in November.  Start DASH diet and daily exercise (walking 30-47mins a day) You will be contacted to schedule appt with weight loss clinic  Will complete insurance form once I get lab results.  DASH Eating Plan DASH stands for "Dietary Approaches to Stop Hypertension." The DASH eating plan is a healthy eating plan that has been shown to reduce high blood pressure (hypertension). It may also reduce your risk for type 2 diabetes, heart disease, and stroke. The DASH eating plan may also help with weight loss. What are tips for following this plan?  General guidelines  Avoid eating more than 2,300 mg (milligrams) of salt (sodium) a day. If you have hypertension, you may need to reduce your sodium intake to 1,500 mg a day.  Limit alcohol intake to no more than 1 drink a day for nonpregnant women and 2 drinks a day for men. One drink equals 12 oz of beer, 5 oz of wine, or 1 oz of hard liquor.  Work with your health care provider to maintain a healthy body weight or to lose weight. Ask what an ideal weight is for you.  Get at least 30 minutes of exercise that causes your heart to beat faster (aerobic exercise) most days of the week. Activities may include walking, swimming, or biking.  Work with your health care provider or diet and nutrition specialist (dietitian) to adjust your eating plan to your individual calorie needs. Reading food labels   Check food labels for the amount of sodium per serving. Choose foods with less than 5 percent of the Daily Value of sodium. Generally, foods with less than 300 mg of sodium per serving fit into this eating plan.  To find whole grains, look for the word "whole" as the first word in the ingredient list. Shopping  Buy products labeled as "low-sodium" or "no salt added."  Buy fresh foods. Avoid canned foods and premade or frozen  meals. Cooking  Avoid adding salt when cooking. Use salt-free seasonings or herbs instead of table salt or sea salt. Check with your health care provider or pharmacist before using salt substitutes.  Do not fry foods. Cook foods using healthy methods such as baking, boiling, grilling, and broiling instead.  Cook with heart-healthy oils, such as olive, canola, soybean, or sunflower oil. Meal planning  Eat a balanced diet that includes: ? 5 or more servings of fruits and vegetables each day. At each meal, try to fill half of your plate with fruits and vegetables. ? Up to 6-8 servings of whole grains each day. ? Less than 6 oz of lean meat, poultry, or fish each day. A 3-oz serving of meat is about the same size as a deck of cards. One egg equals 1 oz. ? 2 servings of low-fat dairy each day. ? A serving of nuts, seeds, or beans 5 times each week. ? Heart-healthy fats. Healthy fats called Omega-3 fatty acids are found in foods such as flaxseeds and coldwater fish, like sardines, salmon, and mackerel.  Limit how much you eat of the following: ? Canned or prepackaged foods. ? Food that is high in trans fat, such as fried foods. ? Food that is high in saturated fat, such as fatty meat. ? Sweets, desserts, sugary drinks, and other foods with added sugar. ? Full-fat dairy products.  Do not salt foods before eating.  Try  to eat at least 2 vegetarian meals each week.  Eat more home-cooked food and less restaurant, buffet, and fast food.  When eating at a restaurant, ask that your food be prepared with less salt or no salt, if possible. What foods are recommended? The items listed may not be a complete list. Talk with your dietitian about what dietary choices are best for you. Grains Whole-grain or whole-wheat bread. Whole-grain or whole-wheat pasta. Brown rice. [place]row. Bulgur. Whole-grain and low-sodium cereals. Pita bread. Low-fat, low-sodium crackers. Whole-wheat flour  tortillas. Vegetables Fresh or frozen vegetables (raw, steamed, roasted, or grilled). Low-sodium or reduced-sodium tomato and vegetable juice. Low-sodium or reduced-sodium tomato sauce and tomato paste. Low-sodium or reduced-sodium canned vegetables. Fruits All fresh, dried, or frozen fruit. Canned fruit in natural juice (without added sugar). Meat and other protein foods Skinless chicken or [place]. Ground chicken or [place]. Pork with fat trimmed off. Fish and seafood. Egg whites. Dried beans, peas, or lentils. Unsalted nuts, nut butters, and seeds. Unsalted canned beans. Lean cuts of beef with fat trimmed off. Low-sodium, lean deli meat. Dairy Low-fat (1%) or fat-free (skim) milk. Fat-free, low-fat, or reduced-fat cheeses. Nonfat, low-sodium ricotta or cottage cheese. Low-fat or nonfat yogurt. Low-fat, low-sodium cheese. Fats and oils Soft margarine without trans fats. Vegetable oil. Low-fat, reduced-fat, or light mayonnaise and salad dressings (reduced-sodium). Canola, safflower, olive, soybean, and sunflower oils. Avocado. Seasoning and other foods Herbs. Spices. Seasoning mixes without salt. Unsalted popcorn and pretzels. Fat-free sweets. What foods are not recommended? The items listed may not be a complete list. Talk with your dietitian about what dietary choices are best for you. Grains Baked goods made with fat, such as croissants, muffins, or some breads. Dry pasta or rice meal packs. Vegetables Creamed or fried vegetables. Vegetables in a cheese sauce. Regular canned vegetables (not low-sodium or reduced-sodium). Regular canned tomato sauce and paste (not low-sodium or reduced-sodium). Regular tomato and vegetable juice (not low-sodium or reduced-sodium). Angie Fava. Olives. Fruits Canned fruit in a light or heavy syrup. Fried fruit. Fruit in cream or butter sauce. Meat and other protein foods Fatty cuts of meat. Ribs. Fried meat. Berniece Salines. Sausage. Bologna and other processed lunch meats.  Salami. Fatback. Hotdogs. Bratwurst. Salted nuts and seeds. Canned beans with added salt. Canned or smoked fish. Whole eggs or egg yolks. Chicken or [place] with skin. Dairy Whole or 2% milk, cream, and half-and-half. Whole or full-fat cream cheese. Whole-fat or sweetened yogurt. Full-fat cheese. Nondairy creamers. Whipped toppings. Processed cheese and cheese spreads. Fats and oils Butter. Stick margarine. Lard. Shortening. Ghee. Bacon fat. Tropical oils, such as coconut, palm kernel, or palm oil. Seasoning and other foods Salted popcorn and pretzels. Onion salt, garlic salt, seasoned salt, table salt, and sea salt. Worcestershire sauce. Tartar sauce. Barbecue sauce. Teriyaki sauce. Soy sauce, including reduced-sodium. Steak sauce. Canned and packaged gravies. Fish sauce. Oyster sauce. Cocktail sauce. Horseradish that you find on the shelf. Ketchup. Mustard. Meat flavorings and tenderizers. Bouillon cubes. Hot sauce and Tabasco sauce. Premade or packaged marinades. Premade or packaged taco seasonings. Relishes. Regular salad dressings. Where to find more information:  National Heart, Lung, and [place]: https://wilson-eaton.com/  American Heart Association: www.heart.org Summary  The DASH eating plan is a healthy eating plan that has been shown to reduce high blood pressure (hypertension). It may also reduce your risk for type 2 diabetes, heart disease, and stroke.  With the DASH eating plan, you should limit salt (sodium) intake to 2,300 mg a day. If you  have hypertension, you may need to reduce your sodium intake to 1,500 mg a day.  When on the DASH eating plan, aim to eat more fresh fruits and vegetables, whole grains, lean proteins, low-fat dairy, and heart-healthy fats.  Work with your health care provider or diet and nutrition specialist (dietitian) to adjust your eating plan to your individual calorie needs. This information is not intended to replace advice given to you by your health  care provider. Make sure you discuss any questions you have with your health care provider. Document Revised: 03/06/2017 Document Reviewed: 03/17/2016 Elsevier Patient Education  2020 [employer].

## 2019-09-01 ENCOUNTER — Other Ambulatory Visit: Payer: Self-pay

## 2019-09-02 ENCOUNTER — Other Ambulatory Visit (INDEPENDENT_AMBULATORY_CARE_PROVIDER_SITE_OTHER): Payer: BC Managed Care – PPO

## 2019-09-02 ENCOUNTER — Encounter: Payer: Self-pay | Admitting: Nurse Practitioner

## 2019-09-02 DIAGNOSIS — Z136 Encounter for screening for cardiovascular disorders: Secondary | ICD-10-CM

## 2019-09-02 DIAGNOSIS — Z13220 Encounter for screening for lipoid disorders: Secondary | ICD-10-CM | POA: Diagnosis not present

## 2019-09-02 DIAGNOSIS — R739 Hyperglycemia, unspecified: Secondary | ICD-10-CM

## 2019-09-02 DIAGNOSIS — Z0001 Encounter for general adult medical examination with abnormal findings: Secondary | ICD-10-CM | POA: Diagnosis not present

## 2019-09-02 DIAGNOSIS — M1000 Idiopathic gout, unspecified site: Secondary | ICD-10-CM

## 2019-09-02 LAB — COMPREHENSIVE METABOLIC PANEL
ALT: 11 U/L (ref 0–35)
AST: 13 U/L (ref 0–37)
Albumin: 3.9 g/dL (ref 3.5–5.2)
Alkaline Phosphatase: 76 U/L (ref 39–117)
BUN: 12 mg/dL (ref 6–23)
CO2: 29 mEq/L (ref 19–32)
Calcium: 9.2 mg/dL (ref 8.4–10.5)
Chloride: 104 mEq/L (ref 96–112)
Creatinine, Ser: 0.76 mg/dL (ref 0.40–1.20)
GFR: 95.87 mL/min (ref >60.00)
Glucose, Bld: 99 mg/dL (ref 70–99)
Potassium: 4 mEq/L (ref 3.5–5.1)
Sodium: 140 mEq/L (ref 135–145)
Total Bilirubin: 0.4 mg/dL (ref 0.2–1.2)
Total Protein: 6.8 g/dL (ref 6.0–8.3)

## 2019-09-02 LAB — CBC
HCT: 38.9 % (ref 36.0–46.0)
Hemoglobin: 12.8 g/dL (ref 12.0–15.0)
MCHC: 33 g/dL (ref 30.0–36.0)
MCV: 85.8 fl (ref 78.0–100.0)
Platelets: 326 K/uL (ref 150.0–400.0)
RBC: 4.53 Mil/uL (ref 3.87–5.11)
RDW: 13.3 % (ref 11.5–15.5)
WBC: 5.1 K/uL (ref 4.0–10.5)

## 2019-09-02 LAB — LIPID PANEL
Cholesterol: 139 mg/dL (ref 0–200)
HDL: 51.7 mg/dL (ref >39.00)
LDL Cholesterol: 70 mg/dL (ref 0–99)
NonHDL: 86.93
Total CHOL/HDL Ratio: 3
Triglycerides: 87 mg/dL (ref 0.0–149.0)
VLDL: 17.4 mg/dL (ref 0.0–40.0)

## 2019-09-02 LAB — URIC ACID: Uric Acid, Serum: 6 mg/dL (ref 2.4–7.0)

## 2019-09-02 LAB — HEMOGLOBIN A1C: Hgb A1c MFr Bld: 6.2 % (ref 4.6–6.5)

## 2019-09-02 LAB — TSH: TSH: 0.83 u[IU]/mL (ref 0.35–4.50)

## 2019-09-06 ENCOUNTER — Encounter: Payer: Self-pay | Admitting: Nurse Practitioner

## 2019-09-06 DIAGNOSIS — E559 Vitamin D deficiency, unspecified: Secondary | ICD-10-CM

## 2019-09-07 MED ORDER — VITAMIN D (ERGOCALCIFEROL) 50 MCG (2000 UT) PO CAPS: 1.0000 | ORAL_CAPSULE | Freq: Every day | ORAL | 3 refills | Status: DC

## 2019-11-22 DIAGNOSIS — N3281 Overactive bladder: Secondary | ICD-10-CM | POA: Diagnosis not present

## 2020-02-11 DIAGNOSIS — Z1231 Encounter for screening mammogram for malignant neoplasm of breast: Secondary | ICD-10-CM | POA: Diagnosis not present

## 2020-03-15 DIAGNOSIS — Z20822 Contact with and (suspected) exposure to covid-19: Secondary | ICD-10-CM | POA: Diagnosis not present

## 2020-04-03 DIAGNOSIS — Z03818 Encounter for observation for suspected exposure to other biological agents ruled out: Secondary | ICD-10-CM | POA: Diagnosis not present

## 2020-04-15 ENCOUNTER — Other Ambulatory Visit: Payer: Self-pay | Admitting: Nurse Practitioner

## 2020-04-15 DIAGNOSIS — I10 Essential (primary) hypertension: Secondary | ICD-10-CM

## 2020-04-19 NOTE — Telephone Encounter (Signed)
Last OV 08/29/19 Last fill 08/29/19  #90/1 Patient need an office visit before any other refills.

## 2020-07-10 ENCOUNTER — Other Ambulatory Visit: Payer: Self-pay | Admitting: Nurse Practitioner

## 2020-07-10 DIAGNOSIS — I10 Essential (primary) hypertension: Secondary | ICD-10-CM

## 2020-07-11 ENCOUNTER — Other Ambulatory Visit: Payer: Self-pay

## 2020-07-12 ENCOUNTER — Encounter: Payer: Self-pay | Admitting: Nurse Practitioner

## 2020-07-12 ENCOUNTER — Ambulatory Visit (INDEPENDENT_AMBULATORY_CARE_PROVIDER_SITE_OTHER): Payer: BC Managed Care – PPO | Admitting: Nurse Practitioner

## 2020-07-12 VITALS — BP 100/74 | HR 80 | Temp 97.0°F | Ht 67.5 in | Wt 266.6 lb

## 2020-07-12 DIAGNOSIS — I10 Essential (primary) hypertension: Secondary | ICD-10-CM

## 2020-07-12 DIAGNOSIS — R7303 Prediabetes: Secondary | ICD-10-CM

## 2020-07-12 DIAGNOSIS — Z6841 Body Mass Index (BMI) 40.0 and over, adult: Secondary | ICD-10-CM | POA: Diagnosis not present

## 2020-07-12 LAB — BASIC METABOLIC PANEL
BUN: 14 mg/dL (ref 6–23)
CO2: 29 mEq/L (ref 19–32)
Calcium: 9.7 mg/dL (ref 8.4–10.5)
Chloride: 105 mEq/L (ref 96–112)
Creatinine, Ser: 0.74 mg/dL (ref 0.40–1.20)
GFR: 91.27 mL/min (ref >60.00)
Glucose, Bld: 94 mg/dL (ref 70–99)
Potassium: 4.2 mEq/L (ref 3.5–5.1)
Sodium: 140 mEq/L (ref 135–145)

## 2020-07-12 LAB — LIPID PANEL
Cholesterol: 119 mg/dL (ref 0–200)
HDL: 48 mg/dL (ref >39.00)
LDL Cholesterol: 58 mg/dL (ref 0–99)
NonHDL: 71.25
Total CHOL/HDL Ratio: 2
Triglycerides: 64 mg/dL (ref 0.0–149.0)
VLDL: 12.8 mg/dL (ref 0.0–40.0)

## 2020-07-12 LAB — HEMOGLOBIN A1C: Hgb A1c MFr Bld: 6.3 % (ref 4.6–6.5)

## 2020-07-12 LAB — HEPATIC FUNCTION PANEL
ALT: 10 U/L (ref 0–35)
AST: 12 U/L (ref 0–37)
Albumin: 4.1 g/dL (ref 3.5–5.2)
Alkaline Phosphatase: 74 U/L (ref 39–117)
Bilirubin, Direct: 0.1 mg/dL (ref 0.0–0.3)
Total Bilirubin: 0.4 mg/dL (ref 0.2–1.2)
Total Protein: 7.2 g/dL (ref 6.0–8.3)

## 2020-07-12 MED ORDER — SAXENDA 18 MG/3ML ~~LOC~~ SOPN
PEN_INJECTOR | SUBCUTANEOUS | 5 refills | Status: DC
Start: 2020-07-12 — End: 2021-03-05

## 2020-07-12 NOTE — Patient Instructions (Signed)
Go to lab for blood draw Start DASH diet, small meal portions, and daily exercise. Start saxenda as prescribed.   Exercising to Stay Healthy To become healthy and stay healthy, it is recommended that you do moderate-intensity and vigorous-intensity exercise. You can tell that you are exercising at a moderate intensity if your heart starts beating faster and you start breathing faster but can still hold a conversation. You can tell that you are exercising at a vigorous intensity if you are breathing much harder and faster and cannot hold a conversation while exercising. Exercising regularly is important. It has many health benefits, such as:  Improving overall fitness, flexibility, and endurance.  Increasing bone density.  Helping with weight control.  Decreasing body fat.  Increasing muscle strength.  Reducing stress and tension.  Improving overall health. How often should I exercise? Choose an activity that you enjoy, and set realistic goals. Your health care provider can help you make an activity plan that works for you. Exercise regularly as told by your health care provider. This may include:  Doing strength training two times a week, such as: ? Lifting weights. ? Using resistance bands. ? Push-ups. ? Sit-ups. ? Yoga.  Doing a certain intensity of exercise for a given amount of time. Choose from these options: ? A total of 150 minutes of moderate-intensity exercise every week. ? A total of 75 minutes of vigorous-intensity exercise every week. ? A mix of moderate-intensity and vigorous-intensity exercise every week. Children, pregnant women, people who have not exercised regularly, people who are overweight, and older adults may need to talk with a health care provider about what activities are safe to do. If you have a medical condition, be sure to talk with your health care provider before you start a new exercise program. What are some exercise ideas? Moderate-intensity  exercise ideas include:  Walking 1 mile (1.6 km) in about 15 minutes.  Biking.  Hiking.  Golfing.  Dancing.  Water aerobics. Vigorous-intensity exercise ideas include:  Walking 4.5 miles (7.2 km) or more in about 1 hour.  Jogging or running 5 miles (8 km) in about 1 hour.  Biking 10 miles (16.1 km) or more in about 1 hour.  Lap swimming.  Roller-skating or in-line skating.  Cross-country skiing.  Vigorous competitive sports, such as football, basketball, and soccer.  Jumping rope.  Aerobic dancing.   What are some everyday activities that can help me to get exercise?  Yard work, such as: ? Pushing a Surveyor, mining. ? Raking and bagging leaves.  Washing your car.  Pushing a stroller.  Shoveling snow.  Gardening.  Washing windows or floors. How can I be more active in my day-to-day activities?  Use stairs instead of an elevator.  Take a walk during your lunch break.  If you drive, park your car farther away from your work or school.  If you take public transportation, get off one stop early and walk the rest of the way.  Stand up or walk around during all of your indoor phone calls.  Get up, stretch, and walk around every 30 minutes throughout the day.  Enjoy exercise with a friend. Support to continue exercising will help you keep a regular routine of activity. What guidelines can I follow while exercising?  Before you start a new exercise program, talk with your health care provider.  Do not exercise so much that you hurt yourself, feel dizzy, or get very short of breath.  Wear comfortable clothes and wear shoes  with good support.  Drink plenty of water while you exercise to prevent dehydration or heat stroke.  Work out until your breathing and your heartbeat get faster. Where to find more information  [place] Department of Health and Human Services: ThisPath.fi  Centers for Disease Control and Prevention (CDC): FootballExhibition.com.br Summary  Exercising  regularly is important. It will improve your overall fitness, flexibility, and endurance.  Regular exercise also will improve your overall health. It can help you control your weight, reduce stress, and improve your bone density.  Do not exercise so much that you hurt yourself, feel dizzy, or get very short of breath.  Before you start a new exercise program, talk with your health care provider. This information is not intended to replace advice given to you by your health care provider. Make sure you discuss any questions you have with your health care provider. Document Revised: 03/06/2017 Document Reviewed: 02/12/2017 Elsevier Patient Education  2021 Elsevier Inc.   Liraglutide injection (Weight Management) What is this medicine? LIRAGLUTIDE (LIR a GLOO tide) is used to help people lose weight and maintain weight loss. It is used with a reduced-calorie diet and exercise. This medicine may be used for other purposes; ask your health care provider or pharmacist if you have questions. COMMON BRAND NAME(S): Saxenda What should I tell my health care provider before I take this medicine? They need to know if you have any of these conditions:  endocrine tumors (MEN 2) or if someone in your family had these tumors  gallbladder disease  high cholesterol  history of alcohol abuse problem  history of pancreatitis  kidney disease or if you are on dialysis  liver disease  previous swelling of the tongue, face, or lips with difficulty breathing, difficulty swallowing, hoarseness, or tightening of the throat  stomach problems  suicidal thoughts, plans, or attempt; a previous suicide attempt by you or a family member  thyroid cancer or if someone in your family had thyroid cancer  an unusual or allergic reaction to liraglutide, other medicines, foods, dyes, or preservatives  pregnant or trying to get pregnant  breast-feeding How should I use this medicine? This medicine is for  injection under the skin of your upper leg, stomach area, or upper arm. You will be taught how to prepare and give this medicine. Use exactly as directed. Take your medicine at regular intervals. Do not take it more often than directed. This medicine comes with INSTRUCTIONS FOR USE. Ask your pharmacist for directions on how to use this medicine. Read the information carefully. Talk to your pharmacist or health care provider if you have questions. It is important that you put your used needles and syringes in a special sharps container. Do not put them in a trash can. If you do not have a sharps container, call your pharmacist or healthcare provider to get one. A special MedGuide will be given to you by the pharmacist with each prescription and refill. Be sure to read this information carefully each time. Talk to your health care provider about the use of this medicine in children. While it may be prescribed for children as young as 47 years of age for selected conditions, precautions do apply. Overdosage: If you think you have taken too much of this medicine contact a poison control center or emergency room at once. NOTE: This medicine is only for you. Do not share this medicine with others. What if I miss a dose? If you miss a dose, take it as soon  as you can. If it is almost time for your next dose, take only that dose. Do not take double or extra doses. If you miss your dose for 3 days or more, call your doctor or health care professional to talk about how to restart this medicine. What may interact with this medicine?  insulin and other medicines for diabetes This list may not describe all possible interactions. Give your health care provider a list of all the medicines, herbs, non-prescription drugs, or dietary supplements you use. Also tell them if you smoke, drink alcohol, or use illegal drugs. Some items may interact with your medicine. What should I watch for while using this medicine? Visit  your doctor or health care professional for regular checks on your progress. Drink plenty of fluids while taking this medicine. Check with your doctor or health care professional if you get an attack of severe diarrhea, nausea, and vomiting. The loss of too much body fluid can make it dangerous for you to take this medicine. This medicine may affect blood sugar levels. Ask your healthcare provider if changes in diet or medicines are needed if you have diabetes. Patients and their families should watch out for worsening depression or thoughts of suicide. Also watch out for sudden changes in feelings such as feeling anxious, agitated, panicky, irritable, hostile, aggressive, impulsive, severely restless, overly excited and hyperactive, or not being able to sleep. If this happens, especially at the beginning of treatment or after a change in dose, call your health care professional. Women should inform their health care provider if they wish to become pregnant or think they might be pregnant. Losing weight while pregnant is not advised and may cause harm to the unborn child. Talk to your health care provider for more information. What side effects may I notice from receiving this medicine? Side effects that you should report to your doctor or health care professional as soon as possible:  allergic reactions like skin rash, itching or hives, swelling of the face, lips, or tongue  breathing problems  diarrhea that continues or is severe  lump or swelling on the neck  severe nausea  signs and symptoms of infection like fever or chills; cough; sore throat; pain or trouble passing urine  signs and symptoms of low blood sugar such as feeling anxious; confusion; dizziness; increased hunger; unusually weak or tired; increased sweating; shakiness; cold, clammy skin; irritable; headache; blurred vision; fast heartbeat; loss of consciousness  signs and symptoms of kidney injury like trouble passing urine or  change in the amount of urine  trouble swallowing  unusual stomach upset or pain  vomiting Side effects that usually do not require medical attention (report to your doctor or health care professional if they continue or are bothersome):  constipation  decreased appetite  diarrhea  fatigue  headache  nausea  pain, redness, or irritation at site where injected  stomach upset  stuffy or runny nose This list may not describe all possible side effects. Call your doctor for medical advice about side effects. You may report side effects to FDA at 1-800-FDA-1088. Where should I keep my medicine? Keep out of the reach of children. Store unopened pen in a refrigerator between 2 and 8 degrees C (36 and 46 degrees F). Do not freeze or use if the medicine has been frozen. Protect from light and excessive heat. After you first use the pen, it can be stored at room temperature between 15 and 30 degrees C (59 and 86 degrees  F) or in a refrigerator. Throw away your used pen after 30 days or after the expiration date, whichever comes first. Do not store your pen with the needle attached. If the needle is left on, medicine may leak from the pen. NOTE: This sheet is a summary. It may not cover all possible information. If you have questions about this medicine, talk to your doctor, pharmacist, or health care provider.  2021 Elsevier/Gold Standard (2019-03-17 13:56:25)

## 2020-07-12 NOTE — Progress Notes (Signed)
Subjective:  Patient ID: Kimberly Underwood, female    DOB: Oct 31, 1965  Age: 55 y.o. MRN: [id number]  CC: Follow-up (6 month f/u on HTN, hyperglycemia, and Vitamin D. Medication refills needed. )   HPI  Essential hypertension, benign BP at goal with benicar/hctz BP Readings from Last 3 Encounters:  07/12/20 100/74  08/29/19 120/80  07/23/18 124/76   Repeat BMP: stable renal function Continue current dose  Class 3 severe obesity due to excess calories with serious comorbidity and body mass index (BMI) of 40.0 to 44.9 in adult [facility]) Reports she has difficulty maintain heart healthy diet, small portions, regular exercise regimen. She is interested in starting sexanda injection. No hx of acute pancreatitis or cholelithiasis or cholecystitis or constipation No FHx of thyroid cancer.  Advised about her risk of cardiovascular disease, cancer and diabetes. hgbA1c of 6.3 today, normal renal and liver function. Normal lipid panel. Advised about possible side effects of Saxenda and need to titrate dose. Advised about the importance of combining medication with dietary changes and exercise. She verbalized understanding. She declined need for nutritionist referral at this time. Saxenda rx sent F/up in 32month   Reviewed past Medical, Social and Family history today.  Outpatient Medications Prior to Visit  Medication Sig Dispense Refill  . Melatonin 5 MG TABS Take 1-2 tablets (5-10 mg total) by mouth daily.  0  . olmesartan-hydrochlorothiazide (BENICAR HCT) 20-12.5 MG tablet TAKE 1 TABLET BY MOUTH EVERY DAY 90 tablet 0  . Vitamin D, Ergocalciferol, 50 MCG (2000 UT) CAPS Take 1 tablet by mouth at bedtime. 90 capsule 3   No facility-administered medications prior to visit.    ROS See HPI  Objective:  BP 100/74 (BP Location: Left Arm, Patient Position: Sitting, Cuff Size: Large)   Pulse 80   Temp (!) 97 F (36.1 C) (Temporal)   Ht 5' 7.5" (1.715 m)   Wt 266 lb 9.6 oz (120.9 kg)   SpO2  98%   BMI 41.14 kg/m   Physical Exam Vitals reviewed.  Constitutional:      Appearance: She is obese.  Cardiovascular:     Rate and Rhythm: Normal rate and regular rhythm.     Pulses: Normal pulses.     Heart sounds: Normal heart sounds.  Pulmonary:     Effort: Pulmonary effort is normal.     Breath sounds: Normal breath sounds.  Abdominal:     General: Bowel sounds are normal.     Palpations: Abdomen is soft.  Musculoskeletal:     Right lower leg: No edema.     Left lower leg: No edema.  Neurological:     Mental Status: She is alert and oriented to person, place, and time.  Psychiatric:        Mood and Affect: Mood normal.        Behavior: Behavior normal.        Thought Content: Thought content normal.     Assessment & Plan:  This visit occurred during the SARS-CoV-2 public health emergency.  Safety protocols were in place, including screening questions prior to the visit, additional usage of staff PPE, and extensive cleaning of exam room while observing appropriate contact time as indicated for disinfecting solutions.   Zada was seen today for follow-up.  Diagnoses and all orders for this visit:  Essential hypertension, benign -     Basic metabolic panel  Prediabetes -     Hemoglobin A1c -     Hepatic function panel -  Liraglutide -Weight Management (SAXENDA) 18 MG/3ML SOPN; 0.6mg  subcutaneous once a day x 2weeks, then 1.2mg  daily continuously  Class 3 severe obesity due to excess calories with serious comorbidity and body mass index (BMI) of 40.0 to 44.9 in adult [facility]) -     Hepatic function panel -     Lipid panel -     Liraglutide -Weight Management (SAXENDA) 18 MG/3ML SOPN; 0.6mg  subcutaneous once a day x 2weeks, then 1.2mg  daily continuously   Problem List Items Addressed This Visit      Cardiovascular and Mediastinum   Essential hypertension, benign - Primary    BP at goal with benicar/hctz BP Readings from Last 3 Encounters:  07/12/20 100/74   08/29/19 120/80  07/23/18 124/76   Repeat BMP: stable renal function Continue current dose      Relevant Orders   Basic metabolic panel (Completed)     Other   Class 3 severe obesity due to excess calories with serious comorbidity and body mass index (BMI) of 40.0 to 44.9 in adult [facility])    Reports she has difficulty maintain heart healthy diet, small portions, regular exercise regimen. She is interested in starting sexanda injection. No hx of acute pancreatitis or cholelithiasis or cholecystitis or constipation No FHx of thyroid cancer.  Advised about her risk of cardiovascular disease, cancer and diabetes. hgbA1c of 6.3 today, normal renal and liver function. Normal lipid panel. Advised about possible side effects of Saxenda and need to titrate dose. Advised about the importance of combining medication with dietary changes and exercise. She verbalized understanding. She declined need for nutritionist referral at this time. Saxenda rx sent F/up in 38month      Relevant Medications   Liraglutide -Weight Management (SAXENDA) 18 MG/3ML SOPN   Other Relevant Orders   Hepatic function panel (Completed)   Lipid panel (Completed)   Prediabetes   Relevant Medications   Liraglutide -Weight Management (SAXENDA) 18 MG/3ML SOPN   Other Relevant Orders   Hemoglobin A1c (Completed)   Hepatic function panel (Completed)      Follow-up: Return in about 4 weeks (around 08/09/2020) for weight management.  Alysia Penna, NP

## 2020-07-14 ENCOUNTER — Encounter: Payer: Self-pay | Admitting: Nurse Practitioner

## 2020-07-14 DIAGNOSIS — R7303 Prediabetes: Secondary | ICD-10-CM | POA: Insufficient documentation

## 2020-07-14 DIAGNOSIS — Z6841 Body Mass Index (BMI) 40.0 and over, adult: Secondary | ICD-10-CM | POA: Insufficient documentation

## 2020-07-14 DIAGNOSIS — E119 Type 2 diabetes mellitus without complications: Secondary | ICD-10-CM | POA: Insufficient documentation

## 2020-07-14 DIAGNOSIS — E66813 Obesity, class 3: Secondary | ICD-10-CM | POA: Insufficient documentation

## 2020-07-14 NOTE — Assessment & Plan Note (Addendum)
Reports she has difficulty maintain heart healthy diet, small portions, regular exercise regimen. She is interested in starting sexanda injection. No hx of acute pancreatitis or cholelithiasis or cholecystitis or constipation No FHx of thyroid cancer.  Advised about her risk of cardiovascular disease, cancer and diabetes. hgbA1c of 6.3 today, normal renal and liver function. Normal lipid panel. Advised about possible side effects of Saxenda and need to titrate dose. Advised about the importance of combining medication with dietary changes and exercise. She verbalized understanding. She declined need for nutritionist referral at this time. Saxenda rx sent F/up in 51month

## 2020-07-14 NOTE — Assessment & Plan Note (Signed)
>>  ASSESSMENT AND PLAN FOR SEVERE OBESITY (BMI >= 40) (HCC) WRITTEN ON 07/14/2020  6:04 PM BY Germani Gavilanes LUM, NP  Reports she has difficulty maintain heart healthy diet, small portions, regular exercise regimen. She is interested in starting sexanda injection. No hx of acute pancreatitis or cholelithiasis or cholecystitis or constipation No FHx of thyroid cancer.  Advised about her risk of cardiovascular disease, cancer and diabetes. hgbA1c of 6.3 today, normal renal and liver function. Normal lipid panel. Advised about possible side effects of Saxenda and need to titrate dose. Advised about the importance of combining medication with dietary changes and exercise. She verbalized understanding. She declined need for nutritionist referral at this time. Saxenda rx sent F/up in 33month

## 2020-07-14 NOTE — Assessment & Plan Note (Signed)
BP at goal with benicar/hctz BP Readings from Last 3 Encounters:  07/12/20 100/74  08/29/19 120/80  07/23/18 124/76   Repeat BMP: stable renal function Continue current dose

## 2020-07-17 ENCOUNTER — Other Ambulatory Visit: Payer: Self-pay | Admitting: Nurse Practitioner

## 2020-07-17 DIAGNOSIS — I10 Essential (primary) hypertension: Secondary | ICD-10-CM

## 2020-07-24 ENCOUNTER — Telehealth: Payer: Self-pay

## 2020-07-24 NOTE — Telephone Encounter (Signed)
Pt asking if there has been a response to the prior auth sent to her insurance to cover Liraglutide -Weight Management (SAXENDA) 18 MG/3ML SOPN   Please advise Thank!

## 2020-07-27 NOTE — Telephone Encounter (Signed)
Called and informed patient that forms for medication were faxed over to her insurance today and once we receive a response we will contact her.

## 2020-07-27 NOTE — Telephone Encounter (Signed)
Patient is calling back to follow up on previous message. Please give her a call back.

## 2020-08-07 NOTE — Telephone Encounter (Signed)
Pt is stating she spoke with [facility] earlier today 08/07/20 and they told her-they([facility]) is needing more information for her to be able to get this script.

## 2020-08-09 NOTE — Telephone Encounter (Signed)
Spoke with patient and informed her we have not received any additional paperwork from her insurance. Pt verbalized understanding

## 2020-08-23 DIAGNOSIS — M222X2 Patellofemoral disorders, left knee: Secondary | ICD-10-CM | POA: Diagnosis not present

## 2020-08-23 DIAGNOSIS — M25761 Osteophyte, right knee: Secondary | ICD-10-CM | POA: Diagnosis not present

## 2020-08-23 DIAGNOSIS — M170 Bilateral primary osteoarthritis of knee: Secondary | ICD-10-CM | POA: Diagnosis not present

## 2020-08-23 DIAGNOSIS — M25762 Osteophyte, left knee: Secondary | ICD-10-CM | POA: Diagnosis not present

## 2020-08-23 DIAGNOSIS — M25561 Pain in right knee: Secondary | ICD-10-CM | POA: Diagnosis not present

## 2020-08-23 DIAGNOSIS — M25562 Pain in left knee: Secondary | ICD-10-CM | POA: Diagnosis not present

## 2020-08-23 DIAGNOSIS — M1712 Unilateral primary osteoarthritis, left knee: Secondary | ICD-10-CM | POA: Diagnosis not present

## 2020-08-23 DIAGNOSIS — M222X1 Patellofemoral disorders, right knee: Secondary | ICD-10-CM | POA: Diagnosis not present

## 2020-08-23 DIAGNOSIS — M1711 Unilateral primary osteoarthritis, right knee: Secondary | ICD-10-CM | POA: Diagnosis not present

## 2020-08-27 DIAGNOSIS — M1712 Unilateral primary osteoarthritis, left knee: Secondary | ICD-10-CM | POA: Diagnosis not present

## 2020-08-28 DIAGNOSIS — M1711 Unilateral primary osteoarthritis, right knee: Secondary | ICD-10-CM | POA: Diagnosis not present

## 2020-09-04 DIAGNOSIS — M1712 Unilateral primary osteoarthritis, left knee: Secondary | ICD-10-CM | POA: Diagnosis not present

## 2020-09-05 DIAGNOSIS — M1711 Unilateral primary osteoarthritis, right knee: Secondary | ICD-10-CM | POA: Diagnosis not present

## 2020-09-11 ENCOUNTER — Telehealth: Payer: Self-pay

## 2020-09-11 DIAGNOSIS — M1712 Unilateral primary osteoarthritis, left knee: Secondary | ICD-10-CM | POA: Diagnosis not present

## 2020-09-11 DIAGNOSIS — M2352 Chronic instability of knee, left knee: Secondary | ICD-10-CM | POA: Diagnosis not present

## 2020-09-11 NOTE — Telephone Encounter (Signed)
PA for Saxenda was approved by [facility] [employer] from 09/10/20 - 01/10/21.  Pharmacy notified VIA phone. Dm/cma

## 2020-09-13 DIAGNOSIS — M1711 Unilateral primary osteoarthritis, right knee: Secondary | ICD-10-CM | POA: Diagnosis not present

## 2020-09-17 ENCOUNTER — Telehealth: Payer: Self-pay | Admitting: Nurse Practitioner

## 2020-09-17 NOTE — Telephone Encounter (Signed)
Patient states that she needs a prescription for her needles. She picked up the medication, but did not get her needles. Please give her a call back at [phone] once they have been sent in.

## 2020-09-18 DIAGNOSIS — M1712 Unilateral primary osteoarthritis, left knee: Secondary | ICD-10-CM | POA: Diagnosis not present

## 2020-09-18 NOTE — Telephone Encounter (Signed)
Patient called back regarding her needles. Please advise.

## 2020-09-18 NOTE — Telephone Encounter (Signed)
Pharmacy contacted and verbal given for needles for Saxenda.

## 2020-09-20 DIAGNOSIS — M1711 Unilateral primary osteoarthritis, right knee: Secondary | ICD-10-CM | POA: Diagnosis not present

## 2020-11-26 DIAGNOSIS — Z01419 Encounter for gynecological examination (general) (routine) without abnormal findings: Secondary | ICD-10-CM | POA: Diagnosis not present

## 2020-12-18 DIAGNOSIS — H401131 Primary open-angle glaucoma, bilateral, mild stage: Secondary | ICD-10-CM | POA: Diagnosis not present

## 2021-01-07 ENCOUNTER — Other Ambulatory Visit: Payer: Self-pay | Admitting: Nurse Practitioner

## 2021-01-07 DIAGNOSIS — I10 Essential (primary) hypertension: Secondary | ICD-10-CM

## 2021-01-16 ENCOUNTER — Telehealth: Payer: Self-pay | Admitting: Nurse Practitioner

## 2021-01-16 DIAGNOSIS — E559 Vitamin D deficiency, unspecified: Secondary | ICD-10-CM

## 2021-01-16 DIAGNOSIS — I10 Essential (primary) hypertension: Secondary | ICD-10-CM

## 2021-01-18 ENCOUNTER — Telehealth: Payer: Self-pay

## 2021-01-18 NOTE — Telephone Encounter (Signed)
PA for Saxend submitted through cover my meds to [employer]. Awaiting response. Dm/cma  Key: [id number]

## 2021-01-21 NOTE — Telephone Encounter (Signed)
Pt requesting refill on olmesartan-hydrochlorothiazide (BENICAR HCT) 20-12.5 MG tablet [[id number]]

## 2021-01-21 NOTE — Telephone Encounter (Signed)
PA approved from 01/20/21 - 05/23/21. Pharmacy notified VIA phone.  Dm/cma

## 2021-01-23 MED ORDER — OLMESARTAN MEDOXOMIL-HCTZ 20-12.5 MG PO TABS: 1.0000 | ORAL_TABLET | Freq: Every day | ORAL | 0 refills | Status: DC

## 2021-01-23 NOTE — Addendum Note (Signed)
Addended by: Elyn Peers on: 01/23/2021 09:00 AM   Modules accepted: Orders

## 2021-01-24 DIAGNOSIS — H401131 Primary open-angle glaucoma, bilateral, mild stage: Secondary | ICD-10-CM | POA: Diagnosis not present

## 2021-01-24 DIAGNOSIS — H2513 Age-related nuclear cataract, bilateral: Secondary | ICD-10-CM | POA: Diagnosis not present

## 2021-03-04 ENCOUNTER — Other Ambulatory Visit: Payer: Self-pay | Admitting: Nurse Practitioner

## 2021-03-04 DIAGNOSIS — Z6841 Body Mass Index (BMI) 40.0 and over, adult: Secondary | ICD-10-CM

## 2021-03-04 DIAGNOSIS — R7303 Prediabetes: Secondary | ICD-10-CM

## 2021-03-05 NOTE — Telephone Encounter (Signed)
Chart supports rx refill Last ov: 12/20/2020 Last refill: 11/1/022

## 2021-03-12 DIAGNOSIS — H401131 Primary open-angle glaucoma, bilateral, mild stage: Secondary | ICD-10-CM | POA: Diagnosis not present

## 2021-03-16 DIAGNOSIS — Z1231 Encounter for screening mammogram for malignant neoplasm of breast: Secondary | ICD-10-CM | POA: Diagnosis not present

## 2021-03-16 LAB — HM MAMMOGRAPHY

## 2021-04-16 ENCOUNTER — Other Ambulatory Visit: Payer: Self-pay | Admitting: Nurse Practitioner

## 2021-04-16 DIAGNOSIS — I10 Essential (primary) hypertension: Secondary | ICD-10-CM

## 2021-04-25 ENCOUNTER — Telehealth: Payer: Self-pay | Admitting: Nurse Practitioner

## 2021-04-25 DIAGNOSIS — I10 Essential (primary) hypertension: Secondary | ICD-10-CM

## 2021-04-29 NOTE — Telephone Encounter (Signed)
Pt informed last refill that a f/u appointment was needed for additional refills, pt has not scheduled appointment.

## 2021-04-30 NOTE — Telephone Encounter (Signed)
Pt has an upcoming app on 06/05/21 with Claris Gower.

## 2021-05-03 ENCOUNTER — Encounter: Payer: Self-pay | Admitting: Nurse Practitioner

## 2021-05-03 ENCOUNTER — Other Ambulatory Visit: Payer: Self-pay

## 2021-05-03 ENCOUNTER — Ambulatory Visit (INDEPENDENT_AMBULATORY_CARE_PROVIDER_SITE_OTHER): Payer: BC Managed Care – PPO | Admitting: Nurse Practitioner

## 2021-05-03 VITALS — BP 130/84 | HR 76 | Temp 96.4°F | Ht 67.0 in | Wt 259.6 lb

## 2021-05-03 DIAGNOSIS — I10 Essential (primary) hypertension: Secondary | ICD-10-CM

## 2021-05-03 DIAGNOSIS — Z23 Encounter for immunization: Secondary | ICD-10-CM

## 2021-05-03 DIAGNOSIS — Z6841 Body Mass Index (BMI) 40.0 and over, adult: Secondary | ICD-10-CM | POA: Diagnosis not present

## 2021-05-03 DIAGNOSIS — R7303 Prediabetes: Secondary | ICD-10-CM | POA: Diagnosis not present

## 2021-05-03 DIAGNOSIS — Z90710 Acquired absence of both cervix and uterus: Secondary | ICD-10-CM | POA: Insufficient documentation

## 2021-05-03 LAB — COMPREHENSIVE METABOLIC PANEL
ALT: 10 U/L (ref 0–35)
AST: 15 U/L (ref 0–37)
Albumin: 4.1 g/dL (ref 3.5–5.2)
Alkaline Phosphatase: 74 U/L (ref 39–117)
BUN: 14 mg/dL (ref 6–23)
CO2: 31 mEq/L (ref 19–32)
Calcium: 9.9 mg/dL (ref 8.4–10.5)
Chloride: 103 mEq/L (ref 96–112)
Creatinine, Ser: 0.86 mg/dL (ref 0.40–1.20)
GFR: 75.78 mL/min (ref >60.00)
Glucose, Bld: 96 mg/dL (ref 70–99)
Potassium: 3.8 mEq/L (ref 3.5–5.1)
Sodium: 140 mEq/L (ref 135–145)
Total Bilirubin: 0.4 mg/dL (ref 0.2–1.2)
Total Protein: 7.7 g/dL (ref 6.0–8.3)

## 2021-05-03 LAB — TSH: TSH: 0.81 u[IU]/mL (ref 0.35–5.50)

## 2021-05-03 LAB — HEMOGLOBIN A1C: Hgb A1c MFr Bld: 6.4 % (ref 4.6–6.5)

## 2021-05-03 MED ORDER — SAXENDA 18 MG/3ML ~~LOC~~ SOPN: PEN_INJECTOR | SUBCUTANEOUS | 5 refills | Status: DC

## 2021-05-03 MED ORDER — OLMESARTAN MEDOXOMIL-HCTZ 20-12.5 MG PO TABS: 1.0000 | ORAL_TABLET | Freq: Every day | ORAL | 1 refills | Status: DC

## 2021-05-03 NOTE — Assessment & Plan Note (Signed)
Repeat hgbA1c Advised about importance of exercise and low carb/low sugar diet.

## 2021-05-03 NOTE — Progress Notes (Signed)
Subjective:  Patient ID: Kimberly Underwood, female    DOB: 1965/10/31  Age: 56 y.o. MRN: [id number]  CC: Follow-up (Follow up on HTN. Medication refills needed (Olmesartan)/Flu and Shingles vaccine given today. )  HPI  Essential hypertension, benign BP at goal with olmesartan/hctz BP Readings from Last 3 Encounters:  05/03/21 130/84  07/12/20 100/74  08/29/19 120/80   Maintain med dose Repeat CMP F/up in 3-7months  Prediabetes Repeat hgbA1c Advised about importance of exercise and low carb/low sugar diet.  Class 3 severe obesity due to excess calories with serious comorbidity and body mass index (BMI) of 40.0 to 44.9 in adult [facility]) Lost 7lbs with sacenda injection. Current dose of 1.2mg  daily. Denies any adverse side effects. Admits she has not incorporated regular exercise regimen and does not always make healthy diet choices. Advised the importance of incorporating regular exercise and heart healthy diet to prevent possible complications like heart disease, diabetes, stroke, cancer, and fatty liver disease. Printed information provided.  Increase saxenda dose to 1.8mg  x 2weeks then 2.4mg  till next appt in 26months Repeat CMP, TSH and HgbA1c  Wt Readings from Last 3 Encounters:  05/03/21 259 lb 9.6 oz (117.8 kg)  07/12/20 266 lb 9.6 oz (120.9 kg)  08/29/19 255 lb (115.7 kg)    Reviewed past Medical, Social and Family history today.  Outpatient Medications Prior to Visit  Medication Sig Dispense Refill   Melatonin 5 MG TABS Take 1-2 tablets (5-10 mg total) by mouth daily.  0   olmesartan-hydrochlorothiazide (BENICAR HCT) 20-12.5 MG tablet Take 1 tablet by mouth daily. 90 tablet 0   Vitamin D, Ergocalciferol, 50 MCG (2000 UT) CAPS Take 1 tablet by mouth at bedtime. 90 capsule 3   Liraglutide -Weight Management (SAXENDA) 18 MG/3ML SOPN 0.6MG  SUBCUTANEOUS ONCE A DAY X 2WEEKS, THEN 1.2MG  DAILY CONTINUOUSLY 15 mL 5   No facility-administered medications prior to visit.     ROS See HPI  Objective:  BP 130/84 (BP Location: Left Arm, Patient Position: Sitting, Cuff Size: Large)    Pulse 76    Temp (!) 96.4 F (35.8 C) (Temporal)    Ht 5\' 7"  (1.702 m)    Wt 259 lb 9.6 oz (117.8 kg)    SpO2 97%    BMI 40.66 kg/m   Physical Exam Vitals reviewed.  Constitutional:      Appearance: She is obese.  Cardiovascular:     Rate and Rhythm: Normal rate and regular rhythm.     Pulses: Normal pulses.     Heart sounds: Normal heart sounds.  Pulmonary:     Effort: Pulmonary effort is normal.     Breath sounds: Normal breath sounds.  Musculoskeletal:     Right lower leg: No edema.     Left lower leg: No edema.  Neurological:     Mental Status: She is alert and oriented to person, place, and time.  Psychiatric:        Mood and Affect: Mood normal.        Behavior: Behavior normal.        Thought Content: Thought content normal.   Assessment & Plan:  This visit occurred during the SARS-CoV-2 public health emergency.  Safety protocols were in place, including screening questions prior to the visit, additional usage of staff PPE, and extensive cleaning of exam room while observing appropriate contact time as indicated for disinfecting solutions.   Otto was seen today for follow-up.  Diagnoses and all orders for this visit:  Essential  hypertension, benign -     Comprehensive metabolic panel -     TSH  Need for shingles vaccine -     Varicella-zoster vaccine IM  Flu vaccine need -     Flu Vaccine QUAD High Dose(Fluad)  Prediabetes -     Comprehensive metabolic panel -     Hemoglobin A1c -     Liraglutide -Weight Management (SAXENDA) 18 MG/3ML SOPN; 1.8mg  daily x 2weeka, then 2.4mg  daily continuously to next appt  Class 3 severe obesity due to excess calories with serious comorbidity and body mass index (BMI) of 40.0 to 44.9 in adult (HCC) -     Comprehensive metabolic panel -     TSH -     Liraglutide -Weight Management (SAXENDA) 18 MG/3ML SOPN; 1.8mg   daily x 2weeka, then 2.4mg  daily continuously to next appt   Problem List Items Addressed This Visit       Cardiovascular and Mediastinum   Essential hypertension, benign - Primary    BP at goal with olmesartan/hctz BP Readings from Last 3 Encounters:  05/03/21 130/84  07/12/20 100/74  08/29/19 120/80   Maintain med dose Repeat CMP F/up in 3-16months      Relevant Orders   Comprehensive metabolic panel   TSH     Other   Class 3 severe obesity due to excess calories with serious comorbidity and body mass index (BMI) of 40.0 to 44.9 in adult [facility])    Lost 7lbs with sacenda injection. Current dose of 1.2mg  daily. Denies any adverse side effects. Admits she has not incorporated regular exercise regimen and does not always make healthy diet choices. Advised the importance of incorporating regular exercise and heart healthy diet to prevent possible complications like heart disease, diabetes, stroke, cancer, and fatty liver disease. Printed information provided.  Increase saxenda dose to 1.8mg  x 2weeks then 2.4mg  till next appt in 49months Repeat CMP, TSH and HgbA1c      Relevant Medications   Liraglutide -Weight Management (SAXENDA) 18 MG/3ML SOPN   Other Relevant Orders   Comprehensive metabolic panel   TSH   Prediabetes    Repeat hgbA1c Advised about importance of exercise and low carb/low sugar diet.      Relevant Medications   Liraglutide -Weight Management (SAXENDA) 18 MG/3ML SOPN   Other Relevant Orders   Comprehensive metabolic panel   Hemoglobin A1c   Other Visit Diagnoses     Need for shingles vaccine       Relevant Orders   Varicella-zoster vaccine IM   Flu vaccine need       Relevant Orders   Flu Vaccine QUAD High Dose(Fluad)       Follow-up: Return in about 2 months (around 07/01/2021) for weight management.  Alysia Penna, NP

## 2021-05-03 NOTE — Assessment & Plan Note (Signed)
BP at goal with olmesartan/hctz BP Readings from Last 3 Encounters:  05/03/21 130/84  07/12/20 100/74  08/29/19 120/80   Maintain med dose Repeat CMP F/up in 3-4months

## 2021-05-03 NOTE — Assessment & Plan Note (Signed)
>>  ASSESSMENT AND PLAN FOR SEVERE OBESITY (BMI >= 40) (HCC) WRITTEN ON 05/03/2021  8:40 AM BY Tacey Dimaggio LUM, NP  Lost 7lbs with sacenda injection. Current dose of 1.2mg  daily. Denies any adverse side effects. Admits she has not incorporated regular exercise regimen and does not always make healthy diet choices. Advised the importance of incorporating regular exercise and heart healthy diet to prevent possible complications like heart disease, diabetes, stroke, cancer, and fatty liver disease. Printed information provided.  Increase saxenda dose to 1.8mg  x 2weeks then 2.4mg  till next appt in 2months Repeat CMP, TSH and HgbA1c

## 2021-05-03 NOTE — Assessment & Plan Note (Signed)
Lost 7lbs with sacenda injection. Current dose of 1.2mg  daily. Denies any adverse side effects. Admits she has not incorporated regular exercise regimen and does not always make healthy diet choices. Advised the importance of incorporating regular exercise and heart healthy diet to prevent possible complications like heart disease, diabetes, stroke, cancer, and fatty liver disease. Printed information provided.  Increase saxenda dose to 1.8mg  x 2weeks then 2.4mg  till next appt in 29months Repeat CMP, TSH and HgbA1c

## 2021-05-03 NOTE — Patient Instructions (Addendum)
Go to lab for blood draw Increase saxenda dose as directed It is essential to incorporate regular exercise and heart healthy diet to prevent possible complications like heart disease, diabetes, stroke, cancer, and fatty liver disease.  Form completed and return to Ms. Grego in office  How to Increase Your Level of Physical Activity Getting regular physical activity is important for your overall health and well-being. Most people do not get enough exercise. There are easy ways to increase your level of physical activity, even if you have not been very active in the past or if you are just starting out. What are the benefits of physical activity? Physical activity has many short-term and long-term benefits. Being active on a regular basis can improve your physical and mental health as well as provide other benefits. Physical health benefits Helping you lose weight or maintain a healthy weight. Strengthening your muscles and bones. Reducing your risk of certain long-term (chronic) diseases, including heart disease, cancer, and diabetes. Being able to move around more easily and for longer periods of time without getting tired (increased endurance or stamina). Improving your ability to fight off illness (enhanced immunity). Being able to sleep better. Helping you stay healthy as you get older, including: Helping you stay mobile, or capable of walking and moving around. Preventing accidents, such as falls. Increasing life expectancy. Mental health benefits Boosting your mood and improving your self-esteem. Lowering your chance of having mental health problems, such as depression or anxiety. Helping you feel good about your body. Other benefits Finding new sources of fun and enjoyment. Meeting new people who share a common interest. Before you begin If you have a chronic illness or have not been active for a while, check with your health care provider about how to get started. Ask your health  care provider what activities are safe for you. Start out slowly. Walking or doing some simple chair exercises is a good place to start, especially if you have not been active before or for a long time. Set goals that you can work toward. Ask your health care provider how much exercise is best for you. In general, most adults should: Do moderate-intensity exercise for at least 150 minutes each week (30 minutes on most days of the week) or vigorous exercise for at least 75 minutes each week, or a combination of these. Moderate-intensity exercise can include walking at a quick pace, biking, yoga, water aerobics, or gardening. Vigorous exercise involves activities that take more effort, such as jogging or running, playing sports, swimming laps, or jumping rope. Do strength exercises on at least 2 days each week. This can include weight lifting, body weight exercises, and resistance-band exercises. How to be more physically active Make a plan  Try to find activities that you enjoy. You are more likely to commit to an exercise routine if it does not feel like a chore. If you have bone or joint problems, choose low-impact exercises, like walking or swimming. Use these tips for being successful with an exercise plan: Find a workout partner for accountability. Join a group or class, such as an aerobics class, cycling class, or sports team. Make family time active. Go for a walk, bike, or swim. Include a variety of exercises each week. Consider using a fitness tracker, such as a mobile phone app or a device worn like a watch, that will count the number of steps you take each day. Many people strive to reach 10,000 steps a day. Find ways to be active  in your daily routines Besides your formal exercise plans, you can find ways to do physical activity during your daily routines, such as: Walking or biking to work or to the store. Taking the stairs instead of the elevator. Parking farther away from the door  at work or at the store. Planning walking meetings. Walking around while you are on the phone. Where to find more information Centers for Disease Control and Prevention: WorkDashboard.es President's Council on Fitness, Sports & Nutrition: www.fitness.gov ChooseMyPlate: MassVoice.es Contact a health care provider if: You have headaches, muscle aches, or joint pain that is concerning. You feel dizzy or light-headed while exercising. You faint. You feel your heart skipping, racing, or fluttering. You have chest pain while exercising. Summary Exercise benefits your mind and body at any age, even if you are just starting out. If you have a chronic illness or have not been active for a while, check with your health care provider before increasing your physical activity. Choose activities that are safe and enjoyable for you. Ask your health care provider what activities are safe for you. Start slowly. Tell your health care provider if you have problems as you start to increase your activity level. This information is not intended to replace advice given to you by your health care provider. Make sure you discuss any questions you have with your health care provider. Document Revised: 07/20/2020 Document Reviewed: 07/20/2020 Elsevier Patient Education  [place] [employer] Calories are units of energy. Your body needs a certain number of calories from food to keep going throughout the day. When you eat or drink more calories than your body needs, your body stores the extra calories mostly as fat. When you eat or drink fewer calories than your body needs, your body burns fat to get the energy it needs. Calorie counting means keeping track of how many calories you eat and drink each day. Calorie counting can be helpful if you need to lose weight. If you eat fewer calories than your body needs, you should lose weight. Ask your health care provider what a  healthy weight is for you. For calorie counting to work, you will need to eat the right number of calories each day to lose a healthy amount of weight per week. A dietitian can help you figure out how many calories you need in a day and will suggest ways to reach your calorie goal. A healthy amount of weight to lose each week is usually 1-2 lb (0.5-0.9 kg). This usually means that your daily calorie intake should be reduced by 500-750 calories. Eating 1,200-1,500 calories a day can help most women lose weight. Eating 1,500-1,800 calories a day can help most men lose weight. What do I need to know about calorie counting? Work with your health care provider or dietitian to determine how many calories you should get each day. To meet your daily calorie goal, you will need to: Find out how many calories are in each food that you would like to eat. Try to do this before you eat. Decide how much of the food you plan to eat. Keep a food log. Do this by writing down what you ate and how many calories it had. To successfully lose weight, it is important to balance calorie counting with a healthy lifestyle that includes regular activity. Where do I find calorie information? The number of calories in a food can be found on a Nutrition Facts label. If a food  does not have a Nutrition Facts label, try to look up the calories online or ask your dietitian for help. Remember that calories are listed per serving. If you choose to have more than one serving of a food, you will have to multiply the calories per serving by the number of servings you plan to eat. For example, the label on a package of bread might say that a serving size is 1 slice and that there are 90 calories in a serving. If you eat 1 slice, you will have eaten 90 calories. If you eat 2 slices, you will have eaten 180 calories. How do I keep a food log? After each time that you eat, record the following in your food log as soon as possible: What you  ate. Be sure to include toppings, sauces, and other extras on the food. How much you ate. This can be measured in cups, ounces, or number of items. How many calories were in each food and drink. The total number of calories in the food you ate. Keep your food log near you, such as in a pocket-sized notebook or on an app or website on your mobile phone. Some programs will calculate calories for you and show you how many calories you have left to meet your daily goal. What are some portion-control tips? Know how many calories are in a serving. This will help you know how many servings you can have of a certain food. Use a measuring cup to measure serving sizes. You could also try weighing out portions on a kitchen scale. With time, you will be able to estimate serving sizes for some foods. Take time to put servings of different foods on your favorite plates or in your favorite bowls and cups so you know what a serving looks like. Try not to eat straight from a food's packaging, such as from a bag or box. Eating straight from the package makes it hard to see how much you are eating and can lead to overeating. Put the amount you would like to eat in a cup or on a plate to make sure you are eating the right portion. Use smaller plates, glasses, and bowls for smaller portions and to prevent overeating. Try not to multitask. For example, avoid watching TV or using your computer while eating. If it is time to eat, sit down at a table and enjoy your food. This will help you recognize when you are full. It will also help you be more mindful of what and how much you are eating. What are tips for following this plan? Reading food labels Check the calorie count compared with the serving size. The serving size may be smaller than what you are used to eating. Check the source of the calories. Try to choose foods that are high in protein, fiber, and vitamins, and low in saturated fat, trans fat, and  sodium. Shopping Read nutrition labels while you shop. This will help you make healthy decisions about which foods to buy. Pay attention to nutrition labels for low-fat or fat-free foods. These foods sometimes have the same number of calories or more calories than the full-fat versions. They also often have added sugar, starch, or salt to make up for flavor that was removed with the fat. Make a grocery list of lower-calorie foods and stick to it. Cooking Try to cook your favorite foods in a healthier way. For example, try baking instead of frying. Use low-fat dairy products. Meal planning Use more  fruits and vegetables. One-half of your plate should be fruits and vegetables. Include lean proteins, such as chicken, [place], and fish. Lifestyle Each week, aim to do one of the following: 150 minutes of moderate exercise, such as walking. 75 minutes of vigorous exercise, such as running. General information Know how many calories are in the foods you eat most often. This will help you calculate calorie counts faster. Find a way of tracking calories that works for you. Get creative. Try different apps or programs if writing down calories does not work for you. What foods should I eat?  Eat nutritious foods. It is better to have a nutritious, high-calorie food, such as an avocado, than a food with few nutrients, such as a bag of potato chips. Use your calories on foods and drinks that will fill you up and will not leave you hungry soon after eating. Examples of foods that fill you up are nuts and nut butters, vegetables, lean proteins, and high-fiber foods such as whole grains. High-fiber foods are foods with more than 5 g of fiber per serving. Pay attention to calories in drinks. Low-calorie drinks include water and unsweetened drinks. The items listed above may not be a complete list of foods and beverages you can eat. Contact a dietitian for more information. What foods should I limit? Limit  foods or drinks that are not good sources of vitamins, minerals, or protein or that are high in unhealthy fats. These include: Candy. Other sweets. Sodas, specialty coffee drinks, alcohol, and juice. The items listed above may not be a complete list of foods and beverages you should avoid. Contact a dietitian for more information. How do I count calories when eating out? Pay attention to portions. Often, portions are much larger when eating out. Try these tips to keep portions smaller: Consider sharing a meal instead of getting your own. If you get your own meal, eat only half of it. Before you start eating, ask for a container and put half of your meal into it. When available, consider ordering smaller portions from the menu instead of full portions. Pay attention to your food and drink choices. Knowing the way food is cooked and what is included with the meal can help you eat fewer calories. If calories are listed on the menu, choose the lower-calorie options. Choose dishes that include vegetables, fruits, whole grains, low-fat dairy products, and lean proteins. Choose items that are boiled, broiled, grilled, or steamed. Avoid items that are buttered, battered, fried, or served with cream sauce. Items labeled as crispy are usually fried, unless stated otherwise. Choose water, low-fat milk, unsweetened iced tea, or other drinks without added sugar. If you want an alcoholic beverage, choose a lower-calorie option, such as a glass of wine or light beer. Ask for dressings, sauces, and syrups on the side. These are usually high in calories, so you should limit the amount you eat. If you want a salad, choose a garden salad and ask for grilled meats. Avoid extra toppings such as bacon, cheese, or fried items. Ask for the dressing on the side, or ask for olive oil and vinegar or lemon to use as dressing. Estimate how many servings of a food you are given. Knowing serving sizes will help you be aware of  how much food you are eating at restaurants. Where to find more information Centers for Disease Control and Prevention: http://www.wolf.info/ U.S. Department of Agriculture: http://www.wilson-mendoza.org/ Summary Calorie counting means keeping track of how many calories you eat and  drink each day. If you eat fewer calories than your body needs, you should lose weight. A healthy amount of weight to lose per week is usually 1-2 lb (0.5-0.9 kg). This usually means reducing your daily calorie intake by 500-750 calories. The number of calories in a food can be found on a Nutrition Facts label. If a food does not have a Nutrition Facts label, try to look up the calories online or ask your dietitian for help. Use smaller plates, glasses, and bowls for smaller portions and to prevent overeating. Use your calories on foods and drinks that will fill you up and not leave you hungry shortly after a meal. This information is not intended to replace advice given to you by your health care provider. Make sure you discuss any questions you have with your health care provider. Document Revised: 05/05/2019 Document Reviewed: 05/05/2019 Elsevier Patient Education  2022 [employer].

## 2021-05-23 DIAGNOSIS — R5383 Other fatigue: Secondary | ICD-10-CM | POA: Diagnosis not present

## 2021-05-23 DIAGNOSIS — R29818 Other symptoms and signs involving the nervous system: Secondary | ICD-10-CM | POA: Diagnosis not present

## 2021-06-05 ENCOUNTER — Ambulatory Visit: Payer: BC Managed Care – PPO | Admitting: Nurse Practitioner

## 2021-07-05 DIAGNOSIS — H401131 Primary open-angle glaucoma, bilateral, mild stage: Secondary | ICD-10-CM | POA: Diagnosis not present

## 2021-07-05 DIAGNOSIS — H2513 Age-related nuclear cataract, bilateral: Secondary | ICD-10-CM | POA: Diagnosis not present

## 2021-07-31 ENCOUNTER — Other Ambulatory Visit: Payer: Self-pay

## 2021-07-31 ENCOUNTER — Emergency Department (HOSPITAL_COMMUNITY)
Admission: EM | Admit: 2021-07-31 | Discharge: 2021-07-31 | Disposition: A | Discharge status: Home / Self Care | Payer: BC Managed Care – PPO | Attending: Student | Admitting: Student

## 2021-07-31 ENCOUNTER — Encounter (HOSPITAL_COMMUNITY): Payer: Self-pay | Admitting: Emergency Medicine

## 2021-07-31 DIAGNOSIS — M545 Low back pain, unspecified: Secondary | ICD-10-CM | POA: Diagnosis not present

## 2021-07-31 DIAGNOSIS — M542 Cervicalgia: Secondary | ICD-10-CM | POA: Diagnosis not present

## 2021-07-31 DIAGNOSIS — Y92410 Unspecified street and highway as the place of occurrence of the external cause: Secondary | ICD-10-CM | POA: Diagnosis not present

## 2021-07-31 MED ORDER — METHOCARBAMOL 500 MG PO TABS: 500.0000 mg | ORAL_TABLET | Freq: Two times a day (BID) | ORAL | 0 refills | Status: DC

## 2021-07-31 MED ORDER — IBUPROFEN 800 MG PO TABS
800.0000 mg | ORAL_TABLET | Freq: Once | ORAL | Status: DC
  Filled 2021-07-31: qty 1
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
## 2021-08-12 ENCOUNTER — Encounter: Payer: Self-pay | Admitting: Nurse Practitioner

## 2021-08-12 ENCOUNTER — Ambulatory Visit (INDEPENDENT_AMBULATORY_CARE_PROVIDER_SITE_OTHER): Payer: BC Managed Care – PPO | Admitting: Nurse Practitioner

## 2021-08-12 DIAGNOSIS — S161XXA Strain of muscle, fascia and tendon at neck level, initial encounter: Secondary | ICD-10-CM | POA: Diagnosis not present

## 2021-08-12 DIAGNOSIS — Z23 Encounter for immunization: Secondary | ICD-10-CM

## 2021-08-12 MED ORDER — IBUPROFEN 600 MG PO TABS: 600.0000 mg | ORAL_TABLET | Freq: Two times a day (BID) | ORAL | 0 refills | Status: DC

## 2021-08-12 MED ORDER — METHOCARBAMOL 750 MG PO TABS: 750.0000 mg | ORAL_TABLET | Freq: Every day | ORAL | 0 refills | Status: DC
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
## 2021-08-12 NOTE — Patient Instructions (Signed)
Call office if no improvement in 1week. ?

## 2021-08-20 ENCOUNTER — Encounter: Payer: Self-pay | Admitting: Podiatry

## 2021-08-20 ENCOUNTER — Ambulatory Visit (INDEPENDENT_AMBULATORY_CARE_PROVIDER_SITE_OTHER): Payer: BC Managed Care – PPO | Admitting: Podiatry

## 2021-08-20 DIAGNOSIS — M79675 Pain in left toe(s): Secondary | ICD-10-CM | POA: Diagnosis not present

## 2021-08-20 DIAGNOSIS — B351 Tinea unguium: Secondary | ICD-10-CM

## 2021-08-20 DIAGNOSIS — M79674 Pain in right toe(s): Secondary | ICD-10-CM | POA: Diagnosis not present

## 2021-08-20 DIAGNOSIS — B353 Tinea pedis: Secondary | ICD-10-CM | POA: Diagnosis not present

## 2021-08-20 MED ORDER — KETOCONAZOLE 2 % EX CREA: 1.0000 "application " | TOPICAL_CREAM | Freq: Every day | CUTANEOUS | 2 refills | Status: DC

## 2021-08-25 ENCOUNTER — Encounter: Payer: Self-pay | Admitting: Podiatry
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
## 2021-09-30 ENCOUNTER — Ambulatory Visit (INDEPENDENT_AMBULATORY_CARE_PROVIDER_SITE_OTHER): Payer: BC Managed Care – PPO | Admitting: Nurse Practitioner

## 2021-09-30 ENCOUNTER — Encounter: Payer: Self-pay | Admitting: Nurse Practitioner

## 2021-09-30 VITALS — BP 122/82 | HR 75 | Temp 96.5°F | Ht 67.0 in | Wt 269.0 lb

## 2021-09-30 DIAGNOSIS — Z136 Encounter for screening for cardiovascular disorders: Secondary | ICD-10-CM | POA: Diagnosis not present

## 2021-09-30 DIAGNOSIS — Z13220 Encounter for screening for lipoid disorders: Secondary | ICD-10-CM | POA: Diagnosis not present

## 2021-09-30 DIAGNOSIS — G4733 Obstructive sleep apnea (adult) (pediatric): Secondary | ICD-10-CM | POA: Insufficient documentation

## 2021-09-30 DIAGNOSIS — E559 Vitamin D deficiency, unspecified: Secondary | ICD-10-CM

## 2021-09-30 DIAGNOSIS — R0681 Apnea, not elsewhere classified: Secondary | ICD-10-CM

## 2021-09-30 DIAGNOSIS — I10 Essential (primary) hypertension: Secondary | ICD-10-CM

## 2021-09-30 DIAGNOSIS — R7303 Prediabetes: Secondary | ICD-10-CM

## 2021-09-30 LAB — BASIC METABOLIC PANEL
BUN: 15 mg/dL (ref 6–23)
CO2: 26 mEq/L (ref 19–32)
Calcium: 9.7 mg/dL (ref 8.4–10.5)
Chloride: 104 mEq/L (ref 96–112)
Creatinine, Ser: 0.87 mg/dL (ref 0.40–1.20)
GFR: 74.52 mL/min (ref >60.00)
Glucose, Bld: 95 mg/dL (ref 70–99)
Potassium: 3.7 mEq/L (ref 3.5–5.1)
Sodium: 138 mEq/L (ref 135–145)

## 2021-09-30 LAB — HEMOGLOBIN A1C: Hgb A1c MFr Bld: 6.3 % (ref 4.6–6.5)

## 2021-09-30 LAB — LIPID PANEL
Cholesterol: 127 mg/dL (ref 0–200)
HDL: 48.5 mg/dL (ref >39.00)
LDL Cholesterol: 43 mg/dL (ref 0–99)
NonHDL: 78.27
Total CHOL/HDL Ratio: 3
Triglycerides: 177 mg/dL — ABNORMAL HIGH (ref 0.0–149.0)
VLDL: 35.4 mg/dL (ref 0.0–40.0)

## 2021-09-30 LAB — VITAMIN D 25 HYDROXY (VIT D DEFICIENCY, FRACTURES): VITD: 22.04 ng/mL — ABNORMAL LOW (ref 30.00–100.00)

## 2021-09-30 MED ORDER — OLMESARTAN MEDOXOMIL-HCTZ 20-12.5 MG PO TABS: 1.0000 | ORAL_TABLET | Freq: Every day | ORAL | 1 refills | Status: DC

## 2021-10-02 MED ORDER — VITAMIN D (ERGOCALCIFEROL) 1.25 MG (50000 UNIT) PO CAPS: 50000.0000 [IU] | ORAL_CAPSULE | ORAL | 0 refills | Status: DC

## 2021-10-02 NOTE — Addendum Note (Signed)
Addended by: Alysia Penna L on: 10/02/2021 01:07 PM   Modules accepted: Orders

## 2021-11-14 ENCOUNTER — Ambulatory Visit (INDEPENDENT_AMBULATORY_CARE_PROVIDER_SITE_OTHER): Payer: BC Managed Care – PPO | Admitting: Neurology

## 2021-11-14 ENCOUNTER — Encounter: Payer: Self-pay | Admitting: Neurology

## 2021-11-14 VITALS — BP 134/79 | HR 74 | Ht 67.0 in | Wt 268.5 lb

## 2021-11-14 DIAGNOSIS — R0683 Snoring: Secondary | ICD-10-CM

## 2021-11-14 DIAGNOSIS — G4719 Other hypersomnia: Secondary | ICD-10-CM

## 2021-11-14 DIAGNOSIS — G4730 Sleep apnea, unspecified: Secondary | ICD-10-CM | POA: Diagnosis not present

## 2021-11-14 DIAGNOSIS — Z820 Family history of epilepsy and other diseases of the nervous system: Secondary | ICD-10-CM

## 2021-11-14 DIAGNOSIS — R351 Nocturia: Secondary | ICD-10-CM

## 2021-11-14 DIAGNOSIS — R0681 Apnea, not elsewhere classified: Secondary | ICD-10-CM

## 2021-11-14 DIAGNOSIS — R519 Headache, unspecified: Secondary | ICD-10-CM

## 2021-11-14 NOTE — Progress Notes (Signed)
Subjective:    Patient ID: Kimberly Underwood is a 56 y.o. female.  HPI    Star Age, MD, PhD [facility] [address], [address].O. [place],  [address]  Dear Baldo Ash,  I saw your patient, Kimberly Underwood, upon your kind request in my sleep clinic today for initial consultation of her sleep disorder, in particular, concern for underlying obstructive sleep apnea.  The patient is unaccompanied today.  As you know, Kimberly Underwood is a 56 year old right-handed woman with an underlying medical history of arthritis, hypertension, vitamin D deficiency, overactive bladder, anxiety and severe obesity with a BMI of over 40, who reports snoring and excessive daytime somnolence as well as witnessed apneas and morning headaches.  I reviewed your office note from 09/30/2021.  Her Epworth sleepiness score is 11/24, fatigue severity score is 38 out of 63.  Her sister has noted apneic pauses while she is asleep, she lives with her mom, sister and son, she has a total of 2 grown children altogether.  Her sister has a sleep apnea diagnosis and uses a machine, her father also has a CPAP machine.  Patient drinks caffeine in the form of soda or tea, typically 1 20 ounce bottle of soda per day and occasional tea.  She is a non-smoker and does not drink alcohol.  She works in Sales executive, has a sedentary job, goes to bed around 7 PM and has a rise time of 3:15 AM except for Fridays when she has to get up at 2 AM.  Her commute is about 15 to 20 minutes.  She has woken up occasionally with a dull, achy headache and sometimes like a migraine.  She has nocturia about 3-4 times per average night.  Her Past Medical History Is Significant For: Past Medical History:  Diagnosis Date   Anxiety    Arthritis 2014   CARPAL TUNNEL SYNDROME, BILATERAL 05/22/2010   Dental caries    Gout    History of bronchitis 10/25/2015   History of CT scan of head 07/2014   due to syncope, normal scan   History  of echocardiogram 07/17/14   TTE, normal LV function, 55-60% EF   History of EKG 07/2014   PVCs, otherwise normal   History of nuclear stress test 07/2014   normal   HYPERTENSION 10/25/2008   Impaired fasting blood sugar    Normal cardiac stress test 07/17/14   Dr. Peter Martinique   Overactive bladder    Vitamin D deficiency    Wears glasses     Her Past Surgical History Is Significant For: Past Surgical History:  Procedure Laterality Date   ABDOMINAL HYSTERECTOMY  09/2010   fibroid, partial hysterectomy    TOENAIL EXCISION Bilateral 2000's   "big toes" (12/23/2012)   WISDOM TOOTH EXTRACTION  ~ 1985    Her Family History Is Significant For: Family History  Problem Relation Age of Onset   Cancer Mother        breast cancer   Hypertension Father    Gout Father    Clotting disorder Father    Lung disease Father        listeria   Stroke Father    Arthritis Father    Heart disease Father    Migraines Sister    Hypertension Sister    Mental illness Sister    Sleep apnea Sister    Stroke Maternal Grandmother    Cancer Maternal Grandfather        lung  cancer (smoking)   Heart disease Daughter        heart murmur   Diabetes Neg Hx     Her Social History Is Significant For: Social History   Socioeconomic History   Marital status: Single    Spouse name: Not on file   Number of children: Not on file   Years of education: Not on file   Highest education level: Not on file  Occupational History   Occupation: Psychologist, educational: [employer]   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   Works in Theatre stage manager at [employer].   As of 10/2015   Social Determinants of Health   Financial Resource Strain: Not on file  Food Insecurity: Not on file  Transportation Needs: Not on file  Physical Activity: Not on file  Stress: Not on file  Social Connections: Not on file    Her Allergies Are:  No Known Allergies:   Her Current Medications Are:  Outpatient Encounter Medications as of 11/14/2021  Medication Sig   bimatoprost (LUMIGAN) 0.01 % SOLN Place 1 drop into both eyes at bedtime.   ibuprofen (ADVIL) 600 MG tablet Take 1 tablet (600 mg total) by mouth every 12 (twelve) hours. With food   Liraglutide -Weight Management (SAXENDA) 18 MG/3ML SOPN 1.8mg  daily x 2weeka, then 2.4mg  daily continuously to next appt   olmesartan-hydrochlorothiazide (BENICAR HCT) 20-12.5 MG tablet Take 1 tablet by mouth daily.   timolol (TIMOPTIC) 0.5 % ophthalmic solution SMARTSIG:In Eye(s)   Vitamin D, Ergocalciferol, (DRISDOL) 1.25 MG (50000 UNIT) CAPS capsule Take 1 capsule (50,000 Units total) by mouth every 7 (seven) days.   No facility-administered encounter medications on file as of 11/14/2021.  :   Review of Systems:  Out of a complete 14 point review of systems, all are reviewed and negative with the exception of these symptoms as listed below:   Review of Systems  Neurological:        Pt here for sleep consult Pt snores,headaches,fatigue,hypertension. [Pt denies sleep study,and CPAP machine     ESS:11 FSS:38    Objective:  Neurological Exam  Physical Exam Physical Examination:   Vitals:   11/14/21 1515  BP: 134/79  Pulse: 74    General Examination: The patient is a very pleasant 56 y.o. female in no acute distress. She appears well-developed and well-nourished and well groomed.   HEENT: Normocephalic, atraumatic, pupils are equal, round and reactive to light, extraocular tracking is good without limitation to gaze excursion or nystagmus noted. Hearing is grossly  intact. Face is symmetric with normal facial animation. Speech with mild voice tremor, at times almost like spasmodic dysphonia.  She has a intermittent head tremor side-to-side, slight right head tilt and slight left head turn.  Airway examination reveals a minimal overbite, tonsillar size of 1-2+ with moderate airway crowding, small airway entry and wider uvula noted.  Neck circumference of 15 7/8 inches, Mallampati class III.   Chest: Clear to auscultation without wheezing, rhonchi or crackles noted.  Heart: S1+S2+0,  regular and normal without murmurs, rubs or gallops noted.   Abdomen: Soft, non-tender and non-distended with normal bowel sounds appreciated on auscultation.  Extremities: There is trace pitting edema in the distal lower extremities bilaterally.   Skin: Warm and dry without trophic changes noted.   Musculoskeletal: exam reveals no obvious joint deformities, tenderness or joint swelling or erythema.   Neurologically:  Mental status: The patient is awake, alert and oriented in all 4 spheres. Her immediate and remote memory, attention, language skills and fund of knowledge are appropriate. There is no evidence of aphasia, agnosia, apraxia or anomia. Speech is clear with normal prosody and enunciation. Thought process is linear. Mood is normal and affect is normal.  Cranial nerves II - XII are as described above under HEENT exam.  Motor exam: Normal bulk, strength and tone is noted. There is no obvious hand tremor. Fine motor skills and coordination: grossly intact.  Cerebellar testing: No dysmetria or intention tremor. There is no truncal or gait ataxia.  Sensory exam: intact to light touch in the upper and lower extremities.  Gait, station and balance: She stands easily. No veering to one side is noted. No leaning to one side is noted. Posture is age-appropriate and stance is narrow based. Gait shows normal stride length and normal pace. No problems turning are noted.    Assessment and Plan:  In summary, YULIYA NOVA is a very pleasant 56 y.o.-year old female with an underlying medical history of arthritis, hypertension, vitamin D deficiency, overactive bladder, anxiety and severe obesity with a BMI of over 40, whose history and physical exam concerning for sleep disordered breathing, supporting a current working diagnosis of unspecified sleep apnea, with the main differential diagnoses of obstructive sleep apnea (OSA) versus upper airway resistance syndrome (UARS) versus central sleep apnea (CSA), or mixed sleep apnea. A laboratory attended sleep study is considered gold standard for evaluation of sleep disordered breathing and is recommended at this time and clinically justified.   I had a long chat with the patient about my findings and the diagnosis of sleep apnea, particularly OSA, its prognosis and treatment options. We talked about medical/conservative treatments, surgical interventions and non-pharmacological approaches for symptom control. I explained, in particular, the risks and ramifications of untreated moderate to severe OSA, especially with respect to developing cardiovascular disease down the road, including congestive heart failure (CHF), difficult to treat hypertension, cardiac arrhythmias (particularly A-fib), neurovascular complications including TIA, stroke and dementia. Even type 2 diabetes has, in part, been linked to untreated OSA. Symptoms of untreated OSA may include (but may not be limited to) daytime sleepiness, nocturia (i.e. frequent nighttime urination), memory problems, mood irritability and suboptimally controlled or worsening mood disorder such as depression and/or anxiety, lack of energy, lack of motivation, physical discomfort, as well as recurrent headaches, especially morning or nocturnal headaches. We talked about the importance of maintaining a healthy lifestyle and striving for healthy weight.  I recommended the following at this  time: sleep study.  I outlined the differences between a laboratory attended sleep study which is considered more comprehensive and accurate over the option of a home sleep test (HST); the latter may lead to underestimation of sleep disordered breathing in some instances and does not help with diagnosing upper airway resistance syndrome and is not accurate enough to diagnose primary central sleep apnea typically. I explained the different sleep test procedures to the patient in detail and also outlined possible surgical and non-surgical treatment options of OSA, including the  use of a pressure airway pressure (PAP) device (ie CPAP, AutoPAP/APAP or BiPAP in certain circumstances), a custom-made dental device (aka oral appliance, which would require a referral to a specialist dentist or orthodontist typically, and is generally speaking not considered a good choice for patients with full dentures or edentulous state), upper airway surgical options, such as traditional UPPP (which is not considered a first-line treatment) or the Inspire device (hypoglossal nerve stimulator, which would involve a referral for consultation with an ENT surgeon, after careful selection, following inclusion criteria). I explained the PAP treatment option to the patient in detail, as this is generally considered first-line treatment.  The patient indicated that she would be willing to try PAP therapy, if the need arises. I explained the importance of being compliant with PAP treatment, not only for insurance purposes but primarily to improve patient's symptoms symptoms, and for the patient's long term health benefit, including to reduce Her cardiovascular risks longer-term.    We will pick up our discussion about the next steps and treatment options after testing.  We will keep her posted as to the test results by phone call and/or MyChart messaging where possible.  We will plan to follow-up in sleep clinic accordingly as well.  I answered  all her questions today and the patient was in agreement.   I encouraged her to call with any interim questions, concerns, problems or updates or email [place] through [place].  Generally speaking, sleep test authorizations may take up to 2 weeks, sometimes less, sometimes longer, the patient is encouraged to get in touch with [place] if they do not hear back from the sleep lab staff directly within the next 2 weeks.  Thank you very much for allowing me to participate in the care of this nice patient. If I can be of any further assistance to you please do not hesitate to call me at [phone].  Sincerely,   Star Age, MD, PhD

## 2021-11-14 NOTE — Patient Instructions (Signed)

## 2021-11-19 ENCOUNTER — Ambulatory Visit (INDEPENDENT_AMBULATORY_CARE_PROVIDER_SITE_OTHER): Payer: BC Managed Care – PPO | Admitting: Podiatry

## 2021-11-19 DIAGNOSIS — B353 Tinea pedis: Secondary | ICD-10-CM

## 2021-11-19 MED ORDER — TERBINAFINE HCL 250 MG PO TABS: 250.0000 mg | ORAL_TABLET | Freq: Every day | ORAL | 0 refills | Status: AC

## 2021-11-19 NOTE — Progress Notes (Signed)
  Subjective:  Patient ID: Kimberly Underwood, female    DOB: 01-Feb-1966,  MRN: [id number]  Chief Complaint  Patient presents with   Follow-up    3 months for athlete's foot, thick nails-patient has been using antifungal spray. Patient interested in taking oral medication.     56 y.o. female presents with the above complaint. History confirmed with patient.  She has not had much relief with the topical medication so far Objective:  Physical Exam: warm, good capillary refill, no trophic changes or ulcerative lesions, normal DP and PT pulses, normal sensory exam, and tinea pedis. Left Foot: dystrophic yellowed discolored nail plates with subungual debris Right Foot: dystrophic yellowed discolored nail plates with subungual debris  Assessment:   1. Tinea pedis of both feet      Plan:  Patient was evaluated and treated and all questions answered.  Discussed the etiology and treatment options for the condition in detail with the patient. Educated patient on the topical and oral treatment options for mycotic nails. Recommended debridement of the nails today. Sharp and mechanical debridement performed of all painful and mycotic nails today. Nails debrided in length and thickness using a nail nipper to level of comfort. Discussed treatment options including appropriate shoe gear. Follow up as needed for painful nails.  Tinea pedis also still bothersome.  I recommended oral therapy for both the nail fungus as well as the tinea pedis.  We discussed use of Lamisil.  Side effects and risk discussed.  She has no known contraindications to this currently.  Rx 90-day course sent to pharmacy.  I will see her back in 3 months for follow-up   Return in about 3 months (around 02/19/2022) for follow up after nail fungus treatment & tinea pedis.

## 2021-11-27 ENCOUNTER — Other Ambulatory Visit: Payer: Self-pay | Admitting: Nurse Practitioner

## 2021-11-27 DIAGNOSIS — R102 Pelvic and perineal pain: Secondary | ICD-10-CM | POA: Diagnosis not present

## 2021-11-27 DIAGNOSIS — N898 Other specified noninflammatory disorders of vagina: Secondary | ICD-10-CM | POA: Diagnosis not present

## 2021-11-27 DIAGNOSIS — Z202 Contact with and (suspected) exposure to infections with a predominantly sexual mode of transmission: Secondary | ICD-10-CM | POA: Diagnosis not present

## 2021-11-27 DIAGNOSIS — Z01419 Encounter for gynecological examination (general) (routine) without abnormal findings: Secondary | ICD-10-CM | POA: Diagnosis not present

## 2021-11-27 NOTE — Telephone Encounter (Signed)
Chart supports Rx Last OV: 09/2021 Next OV: not scheduled    

## 2021-12-03 ENCOUNTER — Other Ambulatory Visit: Payer: Self-pay | Admitting: Podiatry

## 2021-12-03 ENCOUNTER — Telehealth: Payer: Self-pay | Admitting: Nurse Practitioner

## 2021-12-03 NOTE — Telephone Encounter (Signed)
Spoke w/ pt, adv we do not have any forms from the beginning of the year that are scanned into her chart. Says she wants to fax over her form to [place] to fill out, adv it takes 7-10 business days to complete. Pt voiced understanding and says she will send it anyway and let [employer] know that.

## 2021-12-03 NOTE — Telephone Encounter (Signed)
Pt is looking for  a health assessment physical qualification form? She is saying this form should be up here, she is wanting this done by today, or her insurance will double. Please advise pt [phone]

## 2021-12-04 DIAGNOSIS — R0789 Other chest pain: Secondary | ICD-10-CM | POA: Diagnosis not present

## 2021-12-04 DIAGNOSIS — G4733 Obstructive sleep apnea (adult) (pediatric): Secondary | ICD-10-CM | POA: Diagnosis not present

## 2021-12-04 DIAGNOSIS — I10 Essential (primary) hypertension: Secondary | ICD-10-CM | POA: Diagnosis not present

## 2021-12-04 DIAGNOSIS — Z0000 Encounter for general adult medical examination without abnormal findings: Secondary | ICD-10-CM | POA: Diagnosis not present

## 2021-12-04 DIAGNOSIS — E049 Nontoxic goiter, unspecified: Secondary | ICD-10-CM | POA: Diagnosis not present

## 2021-12-04 DIAGNOSIS — Z1211 Encounter for screening for malignant neoplasm of colon: Secondary | ICD-10-CM | POA: Diagnosis not present

## 2021-12-04 DIAGNOSIS — Z13228 Encounter for screening for other metabolic disorders: Secondary | ICD-10-CM | POA: Diagnosis not present

## 2021-12-12 ENCOUNTER — Telehealth: Payer: Self-pay | Admitting: Nurse Practitioner

## 2021-12-12 NOTE — Telephone Encounter (Signed)
Caller Name: pt Call back phone #: [phone]  Reason for Call: Pt needs to know if the information has been faxed or sent to her employer. Please call.

## 2021-12-12 NOTE — Telephone Encounter (Signed)
Called & spoke w/ pt, adv I have faxed over both forms for her lawyer and her qualification form for her job.

## 2021-12-16 DIAGNOSIS — E042 Nontoxic multinodular goiter: Secondary | ICD-10-CM | POA: Diagnosis not present

## 2021-12-21 ENCOUNTER — Emergency Department (HOSPITAL_COMMUNITY)
Admission: EM | Admit: 2021-12-21 | Discharge: 2021-12-21 | Disposition: A | Discharge status: Home / Self Care | Payer: BC Managed Care – PPO | Attending: Emergency Medicine | Admitting: Emergency Medicine

## 2021-12-21 ENCOUNTER — Encounter (HOSPITAL_COMMUNITY): Payer: Self-pay | Admitting: Emergency Medicine

## 2021-12-21 DIAGNOSIS — R220 Localized swelling, mass and lump, head: Secondary | ICD-10-CM | POA: Insufficient documentation

## 2021-12-21 DIAGNOSIS — E876 Hypokalemia: Secondary | ICD-10-CM | POA: Insufficient documentation

## 2021-12-21 DIAGNOSIS — Z20822 Contact with and (suspected) exposure to covid-19: Secondary | ICD-10-CM | POA: Insufficient documentation

## 2021-12-21 LAB — BASIC METABOLIC PANEL
Anion gap: 6 (ref 5–15)
BUN: 13 mg/dL (ref 6–20)
CO2: 26 mmol/L (ref 22–32)
Calcium: 9.1 mg/dL (ref 8.9–10.3)
Chloride: 110 mmol/L (ref 98–111)
Creatinine, Ser: 0.88 mg/dL (ref 0.44–1.00)
GFR, Estimated: >60 mL/min (ref >60)
Glucose, Bld: 164 mg/dL — ABNORMAL HIGH (ref 70–99)
Potassium: 3.3 mmol/L — ABNORMAL LOW (ref 3.5–5.1)
Sodium: 142 mmol/L (ref 135–145)

## 2021-12-21 LAB — CBC WITH DIFFERENTIAL/PLATELET
Abs Immature Granulocytes: 0.02 10*3/uL (ref 0.00–0.07)
Basophils Absolute: 0 10*3/uL (ref 0.0–0.1)
Basophils Relative: 0 %
Eosinophils Absolute: 0 10*3/uL (ref 0.0–0.5)
Eosinophils Relative: 0 %
HCT: 41.6 % (ref 36.0–46.0)
Hemoglobin: 13.7 g/dL (ref 12.0–15.0)
Immature Granulocytes: 0 %
Lymphocytes Relative: 18 %
Lymphs Abs: 1.3 10*3/uL (ref 0.7–4.0)
MCH: 29 pg (ref 26.0–34.0)
MCHC: 32.9 g/dL (ref 30.0–36.0)
MCV: 88.1 fL (ref 80.0–100.0)
Monocytes Absolute: 0.2 10*3/uL (ref 0.1–1.0)
Monocytes Relative: 3 %
Neutro Abs: 5.8 10*3/uL (ref 1.7–7.7)
Neutrophils Relative %: 79 %
Platelets: 345 10*3/uL (ref 150–400)
RBC: 4.72 MIL/uL (ref 3.87–5.11)
RDW: 12.9 % (ref 11.5–15.5)
WBC: 7.4 10*3/uL (ref 4.0–10.5)
nRBC: 0 % (ref 0.0–0.2)

## 2021-12-21 LAB — URINALYSIS, ROUTINE W REFLEX MICROSCOPIC
Bilirubin Urine: NEGATIVE
Glucose, UA: NEGATIVE mg/dL
Hgb urine dipstick: NEGATIVE
Ketones, ur: NEGATIVE mg/dL
Leukocytes,Ua: NEGATIVE
Nitrite: NEGATIVE
Protein, ur: NEGATIVE mg/dL
Specific Gravity, Urine: 1.014 (ref 1.005–1.030)
pH: 5 (ref 5.0–8.0)

## 2021-12-21 LAB — MAGNESIUM: Magnesium: 2.1 mg/dL (ref 1.7–2.4)

## 2021-12-21 LAB — TROPONIN I (HIGH SENSITIVITY): Troponin I (High Sensitivity): <2 ng/L (ref <18)

## 2021-12-21 LAB — RESP PANEL BY RT-PCR (FLU A&B, COVID) ARPGX2
Influenza A by PCR: NEGATIVE
Influenza B by PCR: NEGATIVE
SARS Coronavirus 2 by RT PCR: NEGATIVE

## 2021-12-21 MED ORDER — LACTATED RINGERS IV BOLUS
500.0000 mL | Freq: Once | INTRAVENOUS | Status: AC
  Administered 2021-12-21: 500 mL via INTRAVENOUS

## 2021-12-21 MED ORDER — DEXAMETHASONE 4 MG PO TABS
6.0000 mg | ORAL_TABLET | Freq: Once | ORAL | Status: AC
  Administered 2021-12-21: 6 mg via ORAL
  Filled 2021-12-21: qty 1

## 2021-12-21 MED ORDER — LACTATED RINGERS IV BOLUS: 1000.0000 mL | Freq: Once | INTRAVENOUS | Status: DC

## 2021-12-21 MED ORDER — POTASSIUM CHLORIDE CRYS ER 20 MEQ PO TBCR
40.0000 meq | EXTENDED_RELEASE_TABLET | Freq: Once | ORAL | Status: AC
  Administered 2021-12-21: 40 meq via ORAL
  Filled 2021-12-21: qty 2

## 2021-12-21 NOTE — ED Provider Notes (Signed)
[facility]-EMERGENCY DEPT Provider Note   CSN: [id number] Arrival date & time: 12/21/21  1224     History  Chief Complaint  Patient presents with   Facial Swelling    Kimberly Underwood is a 56 y.o. female.  HPI Patient presents for body aches and facial swelling.  Medical history includes arthritis, gout, anxiety, obesity, prediabetes.  She has not recently started any new medications.  She has no known history of allergies.  Yesterday morning, she woke in her normal state of health.  She typically gets up very early in the morning to go to work.  While she was at work, around 7 AM, she began to feel generalized body aches.  This is most prominent in the area of her left shoulder.  The soreness has persisted since onset.  She has also felt swelling in the area of her face.  This includes periorbital and perioral areas.  She has not experienced any sensation of tongue or throat swelling.  Patient currently endorses mild body aches only.  She denies any recent injuries.    Home Medications Prior to Admission medications   Medication Sig Start Date End Date Taking? Authorizing Provider  bimatoprost (LUMIGAN) 0.01 % SOLN Place 1 drop into both eyes at bedtime.    [provider]  ibuprofen (ADVIL) 600 MG tablet Take 1 tablet (600 mg total) by mouth every 12 (twelve) hours. With food 08/12/21   Nche, Bonna Gains, NP  Liraglutide -Weight Management (SAXENDA) 18 MG/3ML SOPN 1.8mg  daily x 2weeka, then 2.4mg  daily continuously to next appt 05/03/21   Nche, Bonna Gains, NP  olmesartan-hydrochlorothiazide (BENICAR HCT) 20-12.5 MG tablet Take 1 tablet by mouth daily. 09/30/21   Nche, Bonna Gains, NP  terbinafine (LAMISIL) 250 MG tablet Take 1 tablet (250 mg total) by mouth daily. 11/19/21 02/17/22  Edwin Cap, DPM  timolol (TIMOPTIC) 0.5 % ophthalmic solution SMARTSIG:In Eye(s) 11/13/21   [provider]  Vitamin D, Ergocalciferol, (DRISDOL) 1.25 MG (50000  UNIT) CAPS capsule Take 1 capsule (50,000 Units total) by mouth every 7 (seven) days. 10/02/21   Nche, Bonna Gains, NP      Allergies    Patient has no known allergies.    Review of Systems   Review of Systems  HENT:  Positive for facial swelling.   Musculoskeletal:  Positive for myalgias.  All other systems reviewed and are negative.   Physical Exam Updated Vital Signs BP 122/79   Pulse 93   Temp 98.1 F (36.7 C) (Oral)   Resp (!) 24   SpO2 97%  Physical Exam Vitals and nursing note reviewed.  Constitutional:      General: She is not in acute distress.    Appearance: Normal appearance. She is well-developed. She is not ill-appearing, toxic-appearing or diaphoretic.  HENT:     Head: Normocephalic and atraumatic.     Right Ear: External ear normal.     Left Ear: External ear normal.     Nose: Nose normal.     Mouth/Throat:     Mouth: Mucous membranes are moist.     Pharynx: Oropharynx is clear.     Comments: No evidence of tongue or oropharynx swelling. Eyes:     General: No scleral icterus.       Right eye: No discharge.        Left eye: No discharge.     Extraocular Movements: Extraocular movements intact.     Conjunctiva/sclera: Conjunctivae normal.  Comments: Mild periorbital edema that is equal bilaterally  Cardiovascular:     Rate and Rhythm: Normal rate and regular rhythm.     Heart sounds: No murmur heard.    No friction rub. No gallop.  Pulmonary:     Effort: Pulmonary effort is normal. No respiratory distress.     Breath sounds: Normal breath sounds. No wheezing or rales.  Chest:     Chest wall: No tenderness.  Abdominal:     General: There is no distension.     Palpations: Abdomen is soft.     Tenderness: There is no abdominal tenderness.  Musculoskeletal:        General: No swelling. Normal range of motion.     Cervical back: Normal range of motion and neck supple. No rigidity or tenderness.     Right lower leg: No edema.     Left lower leg:  No edema.  Lymphadenopathy:     Cervical: No cervical adenopathy.  Skin:    General: Skin is warm and dry.     Coloration: Skin is not jaundiced or pale.  Neurological:     General: No focal deficit present.     Mental Status: She is alert and oriented to person, place, and time.     Cranial Nerves: No cranial nerve deficit.     Sensory: No sensory deficit.     Motor: No weakness.     Coordination: Coordination normal.  Psychiatric:        Mood and Affect: Mood normal.        Behavior: Behavior normal.        Thought Content: Thought content normal.        Judgment: Judgment normal.     ED Results / Procedures / Treatments   Labs (all labs ordered are listed, but only abnormal results are displayed) Labs Reviewed  BASIC METABOLIC PANEL - Abnormal; Notable for the following components:      Result Value   Potassium 3.3 (*)    Glucose, Bld 164 (*)    All other components within normal limits  URINALYSIS, ROUTINE W REFLEX MICROSCOPIC - Abnormal; Notable for the following components:   Color, Urine STRAW (*)    All other components within normal limits  RESP PANEL BY RT-PCR (FLU A&B, COVID) ARPGX2  CBC WITH DIFFERENTIAL/PLATELET  MAGNESIUM  TROPONIN I (HIGH SENSITIVITY)    EKG EKG Interpretation  Date/Time:  Saturday December 21 2021 14:19:49 EDT Ventricular Rate:  99 PR Interval:  168 QRS Duration: 95 QT Interval:  345 QTC Calculation: 443 R Axis:   43 Text Interpretation: Sinus rhythm Biatrial enlargement Confirmed by Godfrey Pick ([id number]) on 12/21/2021 3:19:49 PM  Radiology No results found.  Procedures Procedures    Medications Ordered in ED Medications  potassium chloride SA (KLOR-CON M) CR tablet 40 mEq (40 mEq Oral Given 12/21/21 1421)  dexamethasone (DECADRON) tablet 6 mg (6 mg Oral Given 12/21/21 1420)  lactated ringers bolus 500 mL (0 mLs Intravenous Stopped 12/21/21 1459)    ED Course/ Medical Decision Making/ A&P                           Medical  Decision Making Amount and/or Complexity of Data Reviewed Labs: ordered.  Risk Prescription drug management.   This patient presents to the ED for concern of facial swelling and body aches, this involves an extensive number of treatment options, and is a complaint that carries with it  a high risk of complications and morbidity.  The differential diagnosis includes allergic reaction, viral illness, nephrosis, ACS   Co morbidities that complicate the patient evaluation  arthritis, gout, anxiety, obesity, prediabetes   Additional history obtained:  Additional history obtained from N/A External records from outside source obtained and reviewed including EMR   Lab Tests:  I Ordered, and personally interpreted labs.  The pertinent results include: Initial lab work shows slight hypokalemia with otherwise normal electrolytes, normal kidney function, normal hemoglobin, no leukocytosis.  COVID and flu are negative.  Troponin is normal.  There is no evidence of proteinuria on urinalysis.  Cardiac Monitoring: / EKG:  The patient was maintained on a cardiac monitor.  I personally viewed and interpreted the cardiac monitored which showed an underlying rhythm of: Sinus rhythm  Problem List / ED Course / Critical interventions / Medication management  Patient is a pleasant 56 year old female presenting for facial swelling and myalgias since yesterday.  She is otherwise been in her normal state of health.  Prior to getting in the ED, basic lab work was obtained.  Results showed mild hypokalemia and were otherwise unremarkable.  On assessment, patient is well-appearing.  She does appear to have very mild generalized facial swelling.  She has no conjunctival injection or discharge.  She has no tongue swelling and oropharynx is widely patent.  Lungs clear to auscultation.  Abdomen soft and nontender.  She has not recently been started on any new medications.  I do suspect some type of environmental  allergen causing her mild facial swelling.  Dose of Decadron was ordered.  In terms of her myalgias, patient does state that this is most prominent in her left shoulder.  This is a concern for possible referred pain from cardiac etiology.  We will check EKG and single troponin.  Patient was given dose of potassium replacement for her mild hypokalemia.  She is on HCTZ and does feel that she has become dehydrated lately.  We will give 500 cc bolus of IV fluid.  We will also check urinalysis for possible proteinuria.  On reassessment, patient reported improved symptoms.  Further lab work shows normal troponin and no evidence of proteinuria on UA.  She was discharged in good condition. I ordered medication including IV fluids for duration; potassium chloride for hypokalemia; Decadron for empiric treatment of mild allergic reaction Reevaluation of the patient after these medicines showed that the patient improved I have reviewed the patients home medicines and have made adjustments as needed   Social Determinants of Health:  Has PCP        Final Clinical Impression(s) / ED Diagnoses Final diagnoses:  Facial swelling    Rx / DC Orders ED Discharge Orders     None         Gloris Manchester, MD 12/21/21 1631

## 2021-12-21 NOTE — Discharge Instructions (Signed)
Follow-up with your primary care doctor for reassessment and ongoing management of your daily medications.  Return to the emergency department for any new or worsening symptoms of concern.

## 2021-12-21 NOTE — ED Provider Triage Note (Signed)
Emergency Medicine Provider Triage Evaluation Note  Kimberly Underwood , a 56 y.o. female  was evaluated in triage.  Pt complains of facial swelling and body tingling/aching/itching. Facial swelling onset yesterday, called EMS and given benadryl. Patient then went to the store and purchased benadryl to continue taking.  Last took benadryl at 6am (25mg ).   Review of Systems  Positive: Facial swelling, body tingling  Negative: Throat swelling, tongue swelling, vomiting, diarrhea   Physical Exam  BP 138/87 (BP Location: Right Arm)   Pulse (!) 110   Temp 98.3 F (36.8 C) (Oral)   Resp 16   SpO2 98%  Gen:   Awake, no distress   Resp:  Normal effort  MSK:   Moves extremities without difficulty  Other:    Medical Decision Making  Medically screening exam initiated at 12:43 PM.  Appropriate orders placed.  DEANE MELICK was informed that the remainder of the evaluation will be completed by another provider, this initial triage assessment does not replace that evaluation, and the importance of remaining in the ED until their evaluation is complete.     Tacy Learn, PA-C 12/21/21 1245

## 2021-12-21 NOTE — ED Triage Notes (Addendum)
Patient reports facial swelling with body aches since yesterday. Denies new medications or contact with allergens. Denies fevers, cough, congestion, dental pains. Denies SOB, difficulty breathing.

## 2021-12-23 ENCOUNTER — Telehealth: Payer: Self-pay

## 2021-12-23 DIAGNOSIS — Z1211 Encounter for screening for malignant neoplasm of colon: Secondary | ICD-10-CM | POA: Diagnosis not present

## 2021-12-23 NOTE — Telephone Encounter (Signed)
Transition Care Management Follow-up Telephone Call Date of discharge and from where: 12/21/21 [facility] How have you been since you were released from the hospital? I'm doing ok, my head is still a little swollen, but other than that I'm doing ok. Any questions or concerns? No  Items Reviewed: Did the pt receive and understand the discharge instructions provided? Yes  Medications obtained and verified? No  Other? No  Any new allergies since your discharge? No  Dietary orders reviewed? Yes Do you have support at home? Yes   Home Care and Equipment/Supplies: Were home health services ordered? not applicable If so, what is the name of the agency? N/a  Has the agency set up a time to come to the patient's home? not applicable Were any new equipment or medical supplies ordered?  No What is the name of the medical supply agency? N/a Were you able to get the supplies/equipment? not applicable Do you have any questions related to the use of the equipment or supplies? No  Functional Questionnaire: (I = Independent and D = Dependent) ADLs: I  Bathing/Dressing- I  Meal Prep- I  Eating- I  Maintaining continence- I  Transferring/Ambulation- I  Managing Meds- I  Follow up appointments reviewed:  PCP Hospital f/u appt confirmed? Yes  Scheduled to see Wilfred Lacy on 01/07/22 @ 11:00am. [facility] f/u appt confirmed?  N/a  Scheduled to see n/a on n/a @ n/a. Are transportation arrangements needed? No  If their condition worsens, is the pt aware to call PCP or go to the Emergency Dept.? Yes Was the patient provided with contact information for the PCP's office or ED? Yes Was to pt encouraged to call back with questions or concerns? Yes  Angeline Slim, RN, BSN

## 2021-12-24 ENCOUNTER — Telehealth: Payer: Self-pay | Admitting: Neurology

## 2021-12-24 ENCOUNTER — Encounter: Payer: Self-pay | Admitting: Internal Medicine

## 2021-12-24 ENCOUNTER — Ambulatory Visit: Payer: BC Managed Care – PPO | Admitting: Internal Medicine

## 2021-12-24 VITALS — Temp 98.0°F | Resp 16 | Ht 67.0 in | Wt 267.0 lb

## 2021-12-24 DIAGNOSIS — I209 Angina pectoris, unspecified: Secondary | ICD-10-CM | POA: Diagnosis not present

## 2021-12-24 DIAGNOSIS — I10 Essential (primary) hypertension: Secondary | ICD-10-CM | POA: Diagnosis not present

## 2021-12-24 DIAGNOSIS — R0609 Other forms of dyspnea: Secondary | ICD-10-CM

## 2021-12-24 HISTORY — DX: Other forms of dyspnea: R06.09

## 2021-12-24 MED ORDER — OLMESARTAN MEDOXOMIL-HCTZ 40-25 MG PO TABS: 1.0000 | ORAL_TABLET | Freq: Every day | ORAL | 2 refills | Status: DC

## 2021-12-24 NOTE — Progress Notes (Signed)
Primary Physician/Referring:  Anne Ng, NP  Patient ID: Kimberly Underwood, female    DOB: [birth year], 56 y.o.   MRN: [id number]  Chief Complaint  Patient presents with   Palpitations   Chest Pain   New Patient (Initial Visit)   HPI:    Kimberly Underwood  is a 56 y.o. AA female with history of hypertension that presents today for evaluation of chest pain and shortness of breath.   She has been having ongoing chest pain for >1 year and shortness of breath with exertion that has worsened over the past few months. She desribes the chest pain as aching pain. She does have associated dizziness without syncope and left arm weakness. She denies nausea, vomiting, diaphoresis. She is not physical active and does not exercise. She reports that diet could be better. She does monitor her blood pressure at home and readings are 140-150s/80.   Past Medical History:  Diagnosis Date   Anxiety    Arthritis 2014   CARPAL TUNNEL SYNDROME, BILATERAL 05/22/2010   Dental caries    Gout    History of bronchitis 10/25/2015   History of CT scan of head 07/2014   due to syncope, normal scan   History of echocardiogram 07/17/14   TTE, normal LV function, 55-60% EF   History of EKG 07/2014   PVCs, otherwise normal   History of nuclear stress test 07/2014   normal   HYPERTENSION 10/25/2008   Impaired fasting blood sugar    Normal cardiac stress test 07/17/14   Dr. Peter [place]   Overactive bladder    Vitamin D deficiency    Wears glasses    Past Surgical History:  Procedure Laterality Date   ABDOMINAL HYSTERECTOMY  09/2010   fibroid, partial hysterectomy    TOENAIL EXCISION Bilateral 2000's   "big toes" (12/23/2012)   WISDOM TOOTH EXTRACTION  ~ 1985   Family History  Problem Relation Age of Onset   Cancer Mother        breast cancer   Hypertension Father    Gout Father    Clotting disorder Father    Lung disease Father        listeria   Stroke Father    Arthritis Father    Heart disease  Father    Migraines Sister    Hypertension Sister    Mental illness Sister    Sleep apnea Sister    Stroke Maternal Grandmother    Cancer Maternal Grandfather        lung cancer (smoking)   Heart disease Daughter        heart murmur   Diabetes Neg Hx     Social History   Tobacco Use   Smoking status: Never   Smokeless tobacco: Never  Substance Use Topics   Alcohol use: Not Currently   Marital Status: Single  ROS  Review of Systems  Cardiovascular:  Positive for chest pain, dyspnea on exertion and leg swelling. Negative for claudication, near-syncope, orthopnea, palpitations and syncope.   Objective  Temperature 98 F (36.7 C), resp. rate 16, height 5\' 7"  (1.702 m), weight 267 lb (121.1 kg), SpO2 96 %. Body mass index is 41.82 kg/m.     12/24/2021    2:39 PM 12/21/2021    5:00 PM 12/21/2021    4:45 PM  Vitals with BMI  Height 5\' 7"     Weight 267 lbs    BMI 41.81    Systolic  132 123  Diastolic  81 77  Pulse  94 88     Physical Exam Constitutional:      Appearance: Normal appearance.  Cardiovascular:     Rate and Rhythm: Normal rate and regular rhythm.     Pulses:          Carotid pulses are 2+ on the right side and 2+ on the left side.      Radial pulses are 2+ on the right side and 2+ on the left side.       Popliteal pulses are 1+ on the right side and 1+ on the left side.       Dorsalis pedis pulses are 1+ on the right side and 1+ on the left side.       Posterior tibial pulses are 1+ on the right side.     Heart sounds: Normal heart sounds. No murmur heard. Pulmonary:     Effort: Pulmonary effort is normal.     Breath sounds: Normal breath sounds.  Chest:     Chest wall: Tenderness present.     Comments: Chest pain reproducible with palpation. Abdominal:     General: Bowel sounds are normal.     Palpations: Abdomen is soft.  Musculoskeletal:     Right lower leg: Edema present.     Left lower leg: Edema present.  Neurological:     Mental Status: She  is alert.    Medications and allergies  No Known Allergies   Medication list after today's encounter   Current Outpatient Medications:    bimatoprost (LUMIGAN) 0.01 % SOLN, Place 1 drop into both eyes at bedtime., Disp: , Rfl:    ibuprofen (ADVIL) 600 MG tablet, Take 1 tablet (600 mg total) by mouth every 12 (twelve) hours. With food, Disp: 30 tablet, Rfl: 0   Liraglutide -Weight Management (SAXENDA) 18 MG/3ML SOPN, 1.8mg  daily x 2weeka, then 2.4mg  daily continuously to next appt, Disp: 15 mL, Rfl: 5   terbinafine (LAMISIL) 250 MG tablet, Take 1 tablet (250 mg total) by mouth daily., Disp: 90 tablet, Rfl: 0   timolol (TIMOPTIC) 0.5 % ophthalmic solution, SMARTSIG:In Eye(s), Disp: , Rfl:    Vitamin D, Ergocalciferol, (DRISDOL) 1.25 MG (50000 UNIT) CAPS capsule, Take 1 capsule (50,000 Units total) by mouth every 7 (seven) days., Disp: 12 capsule, Rfl: 0  Laboratory examination:   Lab Results  Component Value Date   NA 142 12/21/2021   K 3.3 (L) 12/21/2021   CO2 26 12/21/2021   GLUCOSE 164 (H) 12/21/2021   BUN 13 12/21/2021   CREATININE 0.88 12/21/2021   CALCIUM 9.1 12/21/2021   GFRNONAA >60 12/21/2021       Latest Ref Rng & Units 12/21/2021    1:14 PM 09/30/2021    8:45 AM 05/03/2021    8:26 AM  CMP  Glucose 70 - 99 mg/dL 164  95  96   BUN 6 - 20 mg/dL 13  15  14    Creatinine 0.44 - 1.00 mg/dL 0.88  0.87  0.86   Sodium 135 - 145 mmol/L 142  138  140   Potassium 3.5 - 5.1 mmol/L 3.3  3.7  3.8   Chloride 98 - 111 mmol/L 110  104  103   CO2 22 - 32 mmol/L 26  26  31    Calcium 8.9 - 10.3 mg/dL 9.1  9.7  9.9   Total Protein 6.0 - 8.3 g/dL   7.7   Total Bilirubin 0.2 - 1.2 mg/dL   0.4  Alkaline Phos 39 - 117 U/L   74   AST 0 - 37 U/L   15   ALT 0 - 35 U/L   10       Latest Ref Rng & Units 12/21/2021    1:14 PM 09/02/2019    8:04 AM 08/24/2017    7:55 AM  CBC  WBC 4.0 - 10.5 K/uL 7.4  5.1  5.6   Hemoglobin 12.0 - 15.0 g/dL 82.7  07.8  67.5   Hematocrit 36.0 - 46.0 % 41.6   38.9  38.8   Platelets 150 - 400 K/uL 345  326.0  327.0     Lipid Panel Recent Labs    09/30/21 0845  CHOL 127  TRIG 177.0*  LDLCALC 43  VLDL 35.4  HDL 48.50  CHOLHDL 3    HEMOGLOBIN A1C Lab Results  Component Value Date   HGBA1C 6.3 09/30/2021   MPG 131 07/23/2018   TSH Recent Labs    05/03/21 0826  TSH 0.81    External labs:     Radiology:    Cardiac Studies:   NM Myocardial Spect 07/17/2014: IMPRESSION: Low risk stress nuclear study with no chest pain and no electrocardiographic changes. The scintigraphic results show probable breast attenuation but no ischemia. The gated ejection fraction was 63% and the wall motion was normal.  Echo 2016: Normal   EKG:   EKG 12/24/21: Sinus Rhythm. Normal axis. Left atrial enlargement. Poor R-wave progression. Cannot rule out old anterior infarct.    Assessment     ICD-10-CM   1. Angina pectoris (HCC)  I20.9 EKG 12-Lead    PCV ECHOCARDIOGRAM COMPLETE    PCV MYOCARDIAL PERFUSION WO LEXISCAN    2. Primary hypertension  I10 PCV ECHOCARDIOGRAM COMPLETE    PCV MYOCARDIAL PERFUSION WO LEXISCAN    3. Dyspnea on exertion  R06.09 PCV ECHOCARDIOGRAM COMPLETE    PCV MYOCARDIAL PERFUSION WO LEXISCAN       Orders Placed This Encounter  Procedures   PCV MYOCARDIAL PERFUSION WO LEXISCAN    Standing Status:   Future    Standing Expiration Date:   02/23/2022   EKG 12-Lead   PCV ECHOCARDIOGRAM COMPLETE    Standing Status:   Future    Standing Expiration Date:   12/25/2022    No orders of the defined types were placed in this encounter.   Medications Discontinued During This Encounter  Medication Reason   olmesartan-hydrochlorothiazide (BENICAR HCT) 20-12.5 MG tablet Dose change     Recommendations:   Kimberly Underwood is a 56 y.o.  AA female with history of hypertension that presents today for evaluation of chest pain and shortness of breath.   Angina pectoris (HCC) Dyspnea on exertion Stable angina symptoms  however, symptoms are reproducible with chest palpation which could suggest musculoskeletal etiology but cannot rule out cardiac ischemia given age and risk factors.  EKG without evidence of active ischemia.  Ordered echo and stress test. Discussed no heavy lifting or strenuous activity.   Primary hypertension Blood pressure not well controlled at home. Discussed continued monitoring of blood pressure at home and keeping a log of readings. Reviewed labs, LDL normal. I do not feel that she needs to start a statin at this time. Increase Benicar as ordered. Dicussed healthy diet and exercise as tolerated.  Follow up in 6 weeks or sooner if needed.   Clotilde Dieter, DO, [facility]  12/24/2021, 3:18 PM Office: [phone] Pager: [phone]

## 2021-12-24 NOTE — Telephone Encounter (Signed)
[employer]- [employer] no auth req spoke to [place] ref # [id number].  Patient is scheduled at [facility] for 01/13/22 at 9 pm.  Mailed packet to the patient.

## 2021-12-26 ENCOUNTER — Other Ambulatory Visit: Payer: Self-pay | Admitting: Nurse Practitioner

## 2021-12-26 DIAGNOSIS — E559 Vitamin D deficiency, unspecified: Secondary | ICD-10-CM

## 2022-01-01 ENCOUNTER — Ambulatory Visit
Admission: RE | Admit: 2022-01-01 | Discharge: 2022-01-01 | Disposition: A | Discharge status: Home / Self Care | Payer: BC Managed Care – PPO | Source: Ambulatory Visit | Attending: Physician Assistant | Admitting: Physician Assistant

## 2022-01-01 VITALS — BP 138/86 | HR 87 | Temp 98.3°F | Resp 20

## 2022-01-01 DIAGNOSIS — L299 Pruritus, unspecified: Secondary | ICD-10-CM | POA: Diagnosis not present

## 2022-01-01 DIAGNOSIS — T7840XA Allergy, unspecified, initial encounter: Secondary | ICD-10-CM

## 2022-01-01 MED ORDER — PREDNISONE 10 MG PO TABS: 10.0000 mg | ORAL_TABLET | Freq: Three times a day (TID) | ORAL | 0 refills | Status: DC

## 2022-01-01 MED ORDER — TRIAMCINOLONE ACETONIDE 40 MG/ML IJ SUSP
40.0000 mg | Freq: Once | INTRAMUSCULAR | Status: AC
  Administered 2022-01-01: 40 mg via INTRAMUSCULAR

## 2022-01-01 MED ORDER — LORATADINE 10 MG PO TABS: 10.0000 mg | ORAL_TABLET | Freq: Every day | ORAL | 0 refills | Status: DC

## 2022-01-01 MED ORDER — FAMOTIDINE 20 MG PO TABS: 20.0000 mg | ORAL_TABLET | Freq: Two times a day (BID) | ORAL | 0 refills | Status: DC

## 2022-01-01 NOTE — ED Provider Notes (Signed)
[facility]    CSN: [id number] Arrival date & time: 01/01/22  1535      History   Chief Complaint Chief Complaint  Patient presents with   Hand Problem    Both hands redness on palms, burning sensation, swelling x2 days - Entered by patient    HPI Kimberly Underwood is a 56 y.o. female.   56 year old female presents with rash on hands and feet.  Patient indicates for the past 2 days she has been having rash on her hands and feet, she relates that they are red, swelling, and she has a tremendous amount of itching that is present.  Patient also indicates that on 12/21/2021 she was seen in the emergency room for facial swelling and with rash and hives on her chest.  She relates she was given Benadryl and this tended to improve.  Patient indicates that for the past 2 days she continues to take the Benadryl which is helping to reduce the itching but it has not resolved.  She indicates that the only new products that she has been using is a liquid Dove soap, and Lamisil tablets which she started taking around 1 August.  She indicates that she is not having any wheezing, shortness of breath, difficulty swallowing, or difficulty breathing.  She relates she is tolerating fluids well.  She is still concerned about the rash swelling and redness on her hands and feet.  Today she relates she has not have any rash on any other areas of the body.     Past Medical History:  Diagnosis Date   Anxiety    Arthritis 2014   CARPAL TUNNEL SYNDROME, BILATERAL 05/22/2010   Dental caries    Gout    History of bronchitis 10/25/2015   History of CT scan of head 07/2014   due to syncope, normal scan   History of echocardiogram 07/17/14   TTE, normal LV function, 55-60% EF   History of EKG 07/2014   PVCs, otherwise normal   History of nuclear stress test 07/2014   normal   HYPERTENSION 10/25/2008   Impaired fasting blood sugar    Normal cardiac stress test 07/17/14   Dr. Peter [place]   Overactive  bladder    Vitamin D deficiency    Wears glasses     Patient Active Problem List   Diagnosis Date Noted   Dyspnea on exertion 12/24/2021   Witnessed apneic spells 09/30/2021   History of hysterectomy 05/03/2021   Class 3 severe obesity due to excess calories with serious comorbidity and body mass index (BMI) of 40.0 to 44.9 in adult [facility]) 07/14/2020   Prediabetes 07/14/2020   Degenerative arthritis of knee, bilateral 03/09/2017   Right lateral epicondylitis 03/09/2017   Microscopic hematuria 10/25/2015   Overactive bladder 09/26/2014   Anxiety 09/26/2014   Angina pectoris ([place]) 07/17/2014   Arthritis    Gout    Hypertensive disorder 12/27/2013   Cervical dystonia 11/15/2012   CARPAL TUNNEL SYNDROME, BILATERAL 05/22/2010    Past Surgical History:  Procedure Laterality Date   ABDOMINAL HYSTERECTOMY  09/2010   fibroid, partial hysterectomy    TOENAIL EXCISION Bilateral 2000's   "big toes" (12/23/2012)   WISDOM TOOTH EXTRACTION  ~ 1985    OB History   No obstetric history on file.      Home Medications    Prior to Admission medications   Medication Sig Start Date End Date Taking? Authorizing Provider  famotidine (PEPCID) 20 MG tablet Take 1 tablet (  20 mg total) by mouth 2 (two) times daily. For itching and rash. 01/01/22  Yes Ellsworth Lennox, PA-C  loratadine (CLARITIN) 10 MG tablet Take 1 tablet (10 mg total) by mouth daily. For itching and rash. 01/01/22  Yes Ellsworth Lennox, PA-C  predniSONE (DELTASONE) 10 MG tablet Take 1 tablet (10 mg total) by mouth 3 (three) times daily. 01/01/22  Yes Ellsworth Lennox, PA-C  bimatoprost (LUMIGAN) 0.01 % SOLN Place 1 drop into both eyes at bedtime.    [provider]  ibuprofen (ADVIL) 600 MG tablet Take 1 tablet (600 mg total) by mouth every 12 (twelve) hours. With food 08/12/21   Nche, Bonna Gains, NP  Liraglutide -Weight Management (SAXENDA) 18 MG/3ML SOPN 1.8mg  daily x 2weeka, then 2.4mg  daily continuously to next appt 05/03/21   Nche,  Bonna Gains, NP  olmesartan-hydrochlorothiazide (BENICAR HCT) 40-25 MG tablet Take 1 tablet by mouth daily. 12/24/21   Custovic, Rozell Searing, DO  terbinafine (LAMISIL) 250 MG tablet Take 1 tablet (250 mg total) by mouth daily. 11/19/21 02/17/22  Edwin Cap, DPM  timolol (TIMOPTIC) 0.5 % ophthalmic solution SMARTSIG:In Eye(s) 11/13/21   [provider]  Vitamin D, Ergocalciferol, (DRISDOL) 1.25 MG (50000 UNIT) CAPS capsule TAKE 1 CAPSULE (50,000 UNITS TOTAL) BY MOUTH EVERY 7 (SEVEN) DAYS 12/26/21   Nche, Bonna Gains, NP    Family History Family History  Problem Relation Age of Onset   Cancer Mother        breast cancer   Hypertension Father    Gout Father    Clotting disorder Father    Lung disease Father        listeria   Stroke Father    Arthritis Father    Heart disease Father    Migraines Sister    Hypertension Sister    Mental illness Sister    Sleep apnea Sister    Stroke Maternal Grandmother    Cancer Maternal Grandfather        lung cancer (smoking)   Heart disease Daughter        heart murmur   Diabetes Neg Hx     Social History Social History   Tobacco Use   Smoking status: Never   Smokeless tobacco: Never  Vaping Use   Vaping Use: Never used  Substance Use Topics   Alcohol use: Not Currently   Drug use: No     Allergies   Patient has no known allergies.   Review of Systems Review of Systems  Skin:  Positive for rash (hands and feet).     Physical Exam Triage Vital Signs ED Triage Vitals  Enc Vitals Group     BP 01/01/22 1609 138/86     Pulse Rate 01/01/22 1609 87     Resp 01/01/22 1609 20     Temp 01/01/22 1609 98.3 F (36.8 C)     Temp Source 01/01/22 1609 Oral     SpO2 01/01/22 1609 96 %     Weight --      Height --      Head Circumference --      Peak Flow --      Pain Score 01/01/22 1604 5     Pain Loc --      Pain Edu? --      Excl. in GC? --    No data found.  Updated Vital Signs BP 138/86 (BP Location: Left Arm)    Pulse 87   Temp 98.3 F (36.8 C) (Oral)  Resp 20   SpO2 96%   Visual Acuity Right Eye Distance:   Left Eye Distance:   Bilateral Distance:    Right Eye Near:   Left Eye Near:    Bilateral Near:     Physical Exam Constitutional:      Appearance: Normal appearance.  HENT:     Mouth/Throat:     Mouth: Mucous membranes are moist.     Pharynx: Oropharynx is clear. Uvula midline.  Cardiovascular:     Rate and Rhythm: Normal rate and regular rhythm.     Heart sounds: Normal heart sounds.  Pulmonary:     Effort: Pulmonary effort is normal.     Breath sounds: Normal breath sounds and air entry. No wheezing, rhonchi or rales.  Lymphadenopathy:     Cervical: No cervical adenopathy.  Skin:         Comments: Bilateral hands and feet: Swelling is less than 1+ bilaterally, palms of the hands and plantar surface of the feet are with mild redness without evidence of any hives or papular outbreak. The rest of the body is clear without any evidence of rash.  Neurological:     Mental Status: She is alert.      UC Treatments / Results  Labs (all labs ordered are listed, but only abnormal results are displayed) Labs Reviewed - No data to display  EKG   Radiology No results found.  Procedures Procedures (including critical care time)  Medications Ordered in UC Medications  triamcinolone acetonide (KENALOG-40) injection 40 mg (40 mg Intramuscular Given 01/01/22 1656)    Initial Impression / Assessment and Plan / UC Course  I have reviewed the triage vital signs and the nursing notes.  Pertinent labs & imaging results that were available during my care of the patient were reviewed by me and considered in my medical decision making (see chart for details).       Plan: 1.  The acute allergic reaction and itching will be treated with the following: A.  Benadryl 25 mg every 6 hours to control the reaction and itching. B.  Claritin 10 mg daily to control itching and the  rash. C.  Pepcid 20 mg twice daily to control the reaction and itching. D.  Prednisone 10 mg, 3 times daily for 5 days to treat the reaction and itching. E.  Patient advised to avoid hot showers. F.  Patient advised to follow-up PCP or return to urgent care if symptoms fail to improve. Final Clinical Impressions(s) / UC Diagnoses   Final diagnoses:  Itching  Allergic reaction, initial encounter     Discharge Instructions      Advised to continue taking the Benadryl every 6 hours to help reduce the itching and rash.  (Use with caution because this may make you sleepy) Advised to take Claritin daily as this will help reduce the rash and the itching. Advised to take the Pepcid 20 mg twice daily as this will also help reduce the itching and the rash. Advised take the prednisone 10 mg 3 times a day until completed as this will also help resolve the rash and itching. Advised to stop the Lamisil and also stop the liquid soap that you are using as these could possibly be the cause of the rash. Advised follow-up PCP or return to urgent care if symptoms fail to improve.     ED Prescriptions     Medication Sig Dispense Auth. Provider   loratadine (CLARITIN) 10 MG tablet Take 1 tablet (  10 mg total) by mouth daily. For itching and rash. 15 tablet Ellsworth Lennox, PA-C   famotidine (PEPCID) 20 MG tablet Take 1 tablet (20 mg total) by mouth 2 (two) times daily. For itching and rash. 30 tablet Ellsworth Lennox, PA-C   predniSONE (DELTASONE) 10 MG tablet Take 1 tablet (10 mg total) by mouth 3 (three) times daily. 15 tablet Ellsworth Lennox, PA-C      PDMP not reviewed this encounter.   Ellsworth Lennox, PA-C 01/01/22 1700

## 2022-01-01 NOTE — ED Triage Notes (Signed)
Pt presents with redness, itching, burning to palms of hands and feet x 2 days.  Has been taking Benadryl and itch cream with minimal improvement.   No new chemical exposures.  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  Continued using that soap until today.  New med - Lamisil started in August.

## 2022-01-01 NOTE — Discharge Instructions (Signed)
Advised to continue taking the Benadryl every 6 hours to help reduce the itching and rash.  (Use with caution because this may make you sleepy) Advised to take Claritin daily as this will help reduce the rash and the itching. Advised to take the Pepcid 20 mg twice daily as this will also help reduce the itching and the rash. Advised take the prednisone 10 mg 3 times a day until completed as this will also help resolve the rash and itching. Advised to stop the Lamisil and also stop the liquid soap that you are using as these could possibly be the cause of the rash. Advised follow-up PCP or return to urgent care if symptoms fail to improve.

## 2022-01-07 ENCOUNTER — Encounter: Payer: Self-pay | Admitting: Nurse Practitioner

## 2022-01-07 ENCOUNTER — Telehealth (INDEPENDENT_AMBULATORY_CARE_PROVIDER_SITE_OTHER): Payer: BC Managed Care – PPO | Admitting: Nurse Practitioner

## 2022-01-07 DIAGNOSIS — Z6841 Body Mass Index (BMI) 40.0 and over, adult: Secondary | ICD-10-CM | POA: Diagnosis not present

## 2022-01-07 DIAGNOSIS — R7303 Prediabetes: Secondary | ICD-10-CM | POA: Diagnosis not present

## 2022-01-07 DIAGNOSIS — L509 Urticaria, unspecified: Secondary | ICD-10-CM | POA: Diagnosis not present

## 2022-01-07 MED ORDER — SAXENDA 18 MG/3ML ~~LOC~~ SOPN: 3.0000 mg | PEN_INJECTOR | Freq: Every day | SUBCUTANEOUS | 2 refills | Status: DC

## 2022-01-07 NOTE — Progress Notes (Signed)
Virtual Visit via Video Note  I connected withNAME@ on 01/07/22 at 11:00 AM EDT by a video enabled telemedicine application and verified that I am speaking with the correct person using two identifiers.  Location: Patient:Home Provider: Office Participants: patient and provider  I discussed the limitations of evaluation and management by telemedicine and the availability of in person appointments. I also discussed with the patient that there may be a patient responsible charge related to this service. The patient expressed understanding and agreed to proceed.  IR:WERX. F/up   History of Present Illness:  Class 3 severe obesity due to excess calories with serious comorbidity and body mass index (BMI) of 40.0 to 44.9 in adult [facility]) Denies any adverse effects with saxenda 2.4mg  daily. Will send weight via mychart. Wt Readings from Last 3 Encounters:  12/24/21 267 lb (121.1 kg)  11/14/21 268 lb 8 oz (121.8 kg)  09/30/21 269 lb (122 kg)   Increase saxenda to 3mg  daily F/up in 41months  Hives of unknown origin ED visit on 12/21/21 due to hives, itching and facial swelling. Reviewed lab results: No acute finding. She thinks this meant be related to new body soap she used 2hrs prior to onset of symptoms (dove coconut wash). She denies new medication or new food. Hives and facial swelling have since resolved with use of antihistamine and oral prednisone.  Advised to keep food diary and use only hypoallergenic personal products.  Observations/Objective: Physical Exam Constitutional:      General: She is not in acute distress. Eyes:     Extraocular Movements: Extraocular movements intact.     Conjunctiva/sclera: Conjunctivae normal.  Pulmonary:     Effort: Pulmonary effort is normal.  Musculoskeletal:     Cervical back: Normal range of motion.  Skin:    Findings: No rash.  Neurological:     Mental Status: She is alert and oriented to person, place, and time.     Assessment and  Plan: Aneyah was seen today for acute visit.  Diagnoses and all orders for this visit:  Hives of unknown origin  Prediabetes -     Liraglutide -Weight Management (SAXENDA) 18 MG/3ML SOPN; Inject 3 mg into the skin daily.  Class 3 severe obesity due to excess calories with serious comorbidity and body mass index (BMI) of 40.0 to 44.9 in adult (HCC) -     Liraglutide -Weight Management (SAXENDA) 18 MG/3ML SOPN; Inject 3 mg into the skin daily.    Follow Up Instructions: See instructions above   I discussed the assessment and treatment plan with the patient. The patient was provided an opportunity to ask questions and all were answered. The patient agreed with the plan and demonstrated an understanding of the instructions.   The patient was advised to call back or seek an in-person evaluation if the symptoms worsen or if the condition fails to improve as anticipated.  Wilfred Lacy, NP

## 2022-01-07 NOTE — Assessment & Plan Note (Signed)
>>  ASSESSMENT AND PLAN FOR SEVERE OBESITY (BMI >= 40) (HCC) WRITTEN ON 01/07/2022 11:36 AM BY Kadarrius Yanke LUM, NP  Denies any adverse effects with saxenda 2.4mg  daily. Will send weight via mychart. Wt Readings from Last 3 Encounters:  12/24/21 267 lb (121.1 kg)  11/14/21 268 lb 8 oz (121.8 kg)  09/30/21 269 lb (122 kg)   Increase saxenda to 3mg  daily F/up in 3months

## 2022-01-07 NOTE — Assessment & Plan Note (Signed)
Denies any adverse effects with saxenda 2.4mg  daily. Will send weight via mychart. Wt Readings from Last 3 Encounters:  12/24/21 267 lb (121.1 kg)  11/14/21 268 lb 8 oz (121.8 kg)  09/30/21 269 lb (122 kg)   Increase saxenda to 3mg  daily F/up in 84months

## 2022-01-07 NOTE — Patient Instructions (Signed)
Send weight and BP via [employer] tomorrow

## 2022-01-07 NOTE — Assessment & Plan Note (Signed)
ED visit on 12/21/21 due to hives, itching and facial swelling. Reviewed lab results: No acute finding. She thinks this meant be related to new body soap she used 2hrs prior to onset of symptoms (dove coconut wash). She denies new medication or new food. Hives and facial swelling have since resolved with use of antihistamine and oral prednisone.  Advised to keep food diary and use only hypoallergenic personal products.

## 2022-01-08 ENCOUNTER — Encounter: Payer: Self-pay | Admitting: Nurse Practitioner

## 2022-01-13 ENCOUNTER — Ambulatory Visit (INDEPENDENT_AMBULATORY_CARE_PROVIDER_SITE_OTHER): Payer: BC Managed Care – PPO | Admitting: Neurology

## 2022-01-13 DIAGNOSIS — R0683 Snoring: Secondary | ICD-10-CM | POA: Diagnosis not present

## 2022-01-13 DIAGNOSIS — G4733 Obstructive sleep apnea (adult) (pediatric): Secondary | ICD-10-CM

## 2022-01-13 DIAGNOSIS — G4719 Other hypersomnia: Secondary | ICD-10-CM

## 2022-01-13 DIAGNOSIS — R351 Nocturia: Secondary | ICD-10-CM

## 2022-01-13 DIAGNOSIS — R519 Headache, unspecified: Secondary | ICD-10-CM

## 2022-01-13 DIAGNOSIS — R0681 Apnea, not elsewhere classified: Secondary | ICD-10-CM

## 2022-01-13 DIAGNOSIS — Z820 Family history of epilepsy and other diseases of the nervous system: Secondary | ICD-10-CM

## 2022-01-13 DIAGNOSIS — G4730 Sleep apnea, unspecified: Secondary | ICD-10-CM

## 2022-01-15 ENCOUNTER — Other Ambulatory Visit (HOSPITAL_BASED_OUTPATIENT_CLINIC_OR_DEPARTMENT_OTHER): Payer: Self-pay

## 2022-01-15 DIAGNOSIS — R0681 Apnea, not elsewhere classified: Secondary | ICD-10-CM

## 2022-01-17 DIAGNOSIS — H524 Presbyopia: Secondary | ICD-10-CM | POA: Diagnosis not present

## 2022-01-17 DIAGNOSIS — H2513 Age-related nuclear cataract, bilateral: Secondary | ICD-10-CM | POA: Diagnosis not present

## 2022-01-17 DIAGNOSIS — H401131 Primary open-angle glaucoma, bilateral, mild stage: Secondary | ICD-10-CM | POA: Diagnosis not present

## 2022-01-20 ENCOUNTER — Ambulatory Visit: Payer: BC Managed Care – PPO

## 2022-01-20 DIAGNOSIS — I209 Angina pectoris, unspecified: Secondary | ICD-10-CM

## 2022-01-20 DIAGNOSIS — I10 Essential (primary) hypertension: Secondary | ICD-10-CM | POA: Diagnosis not present

## 2022-01-20 DIAGNOSIS — R0609 Other forms of dyspnea: Secondary | ICD-10-CM | POA: Diagnosis not present

## 2022-01-21 NOTE — Addendum Note (Signed)
Addended by: Star Age on: 01/21/2022 06:16 PM   Modules accepted: Orders

## 2022-01-21 NOTE — Procedures (Signed)
Physician Interpretation:     [facility] POLYSOMNOGRAPHY  INTERPRETATION REPORT   STUDY DATE:  01/13/2022     PATIENT NAME:  Kimberly Underwood         DATE OF BIRTH:  Oct 16, 1965  PATIENT ID:  [id number]    TYPE OF STUDY:  PSG  READING PHYSICIAN: Huston Foley, MD, PhD REFERRED BY: Alysia Penna, NP SCORING TECHNICIAN: Margaretann Loveless, RPSGT  HISTORY: 56 year old right-handed woman with an underlying medical history of arthritis, hypertension, vitamin D deficiency, overactive bladder, anxiety and severe obesity with a BMI of over 40, who reports snoring and excessive daytime somnolence as well as witnessed apneas and morning headaches. I reviewed your office note from 09/30/2021. ?Her Epworth sleepiness score is 11/24,?fatigue severity score is 38 out of 63.  Height: 67 in Weight: 268 lb (BMI 41) Neck Size: 16 in  MEDICATIONS: Lumigan, Advil, Saxenda, Benicar, Timoptic, Vitamin D TECHNICAL DESCRIPTION: A registered sleep technologist was in attendance for the duration of the recording.  Data collection, scoring, video monitoring, and reporting were performed in compliance with the AASM Manual for the Scoring of Sleep and Associated Events; (Hypopnea is scored based on the criteria listed in Section VIII D. 1b in the AASM Manual V2.6 using a 4% oxygen desaturation rule or Hypopnea is scored based on the criteria listed in Section VIII D. 1a in the AASM Manual V2.6 using 3% oxygen desaturation and /or arousal rule).   SLEEP CONTINUITY AND SLEEP ARCHITECTURE:  Lights-out was at 21:28: and lights-on at  04:56:, with a total recording time of 7 hours, 28 minutes. Total sleep time ( TST) was 417.0 minutes with a normal sleep efficiency at 93.1%.   BODY POSITION:  TST was divided  between the following sleep positions: 12.8% supine;  87.2% lateral;  0% prone. Duration of total sleep and percent of total sleep in their respective position is as follows: supine 53 minutes (13%),  non-supine 364 minutes (87%); right 196 minutes (47%), left 167 minutes (40%), and prone 00 minutes (0%).  Total supine REM sleep time was 12 minutes (10% of total REM sleep).  Sleep latency was normal at 8.5 minutes.  REM sleep latency was mildly reduced at 54.0 minutes. Of the total sleep time, the percentage of stage N1 sleep was 2.2%, stage N2 sleep was 50%, which is normal, stage N3 sleep was 18.0%, which is normal, and REM sleep was 29.7%, which is increased. Wake after sleep onset (WASO) time accounted for 22.5 minutes with minimal to mild sleep fragmentation noted.   RESPIRATORY MONITORING:   Based on CMS criteria (using a 4% oxygen desaturation rule for scoring hypopneas), there were 0 apneas (0 obstructive; 0 central; 0 mixed), and 134 hypopneas.  Apnea index was 0.0. Hypopnea index was 19.3. The apnea-hypopnea index was 19.3 overall (17.9 supine, 52 non-supine; 54.2 REM, 72.0 supine REM).  There were 0 respiratory effort-related arousals (RERAs).  The RERA index was 0 events/h. Total respiratory disturbance index (RDI) was 19.3 events/h. RDI results showed: supine RDI  17.9 /h; non-supine RDI 19.5 /h; REM RDI 54.2 /h, supine REM RDI 72.0 /h.   Based on AASM criteria (using a 3% oxygen desaturation and /or arousal rule for scoring hypopneas), there were 0 apneas (0 obstructive; 0 central; 0 mixed), and 168 hypopneas. Apnea index was 0.0. Hypopnea index was 24.2. The apnea-hypopnea index was 24.2/hour overall (20.2 supine, 60 non-supine; 61.0 REM, 72.0 supine REM).  There were 0 respiratory effort-related arousals (RERAs).  The RERA index was 0 events/h. Total respiratory disturbance index (RDI) was 24.2 events/h. RDI results showed: supine RDI  20.2 /h; non-supine RDI 24.8 /h; REM RDI 61.0 /h, supine REM RDI 72.0 /h.  OXIMETRY: Oxyhemoglobin Saturation Nadir during sleep was at  74%) from a mean of 93%.  Of the Total sleep time (TST)   hypoxemia (=<88%) was present for  31.6 minutes, or 7.6% of  total sleep time.  LIMB MOVEMENTS: There were 0 periodic limb movements of sleep (0.0/hr), of which 0 (0.0/hr) were associated with an arousal. AROUSAL: There were 51 arousals in total, for an arousal index of 7 arousals/hour.  Of these, 15 were identified as respiratory-related arousals (2 /h), 0 were PLM-related arousals (0 /h), and 39 were non-specific arousals (6 /h). Snoring was classified as mild and intermittent.  EEG: Review of the EEG showed no abnormal electrical discharges and symmetrical bihemispheric findings.    EKG: The EKG revealed normal sinus rhythm (NSR). The average heart rate during sleep was 77 bpm.  The heart rate during sleep varied between a minimum of Tachycardia and  a maximum of  92 bpm.  AUDIO/VIDEO REVIEW: The audio and video review did not show any abnormal or unusual behaviors, movements, phonations or vocalizations. The patient took 1 bathroom break. Snoring was overall in the mild range, intermittent.  POST-STUDY QUESTIONNAIRE: Post study, the patient indicated, that sleep was the same as usual.  IMPRESSION:   1. Obstructive sleep apnea (OSA)  RECOMMENDATIONS:  1. This study demonstrates moderate to severe obstructive sleep apnea, with a total AHI of 24.2/hour, REM AHI of 61/hour, supine AHI of 20.2/hour and O2 nadir of 74%. Treatment with positive airway pressure in the form of CPAP is recommended. This will require a full night titration study to optimize therapy settings, mask fit, monitoring of tolerance and of proper oxygen saturations. Other treatment options may be limited, and may include (generally speaking) surgical options in selected patients or the use of an oral appliance in certain patients. Concomitant weight loss is recommended. Please note that untreated obstructive sleep apnea may carry additional perioperative morbidity. Patients with significant obstructive sleep apnea should receive perioperative PAP therapy and the surgeons and particularly  the anesthesiologist should be informed of the diagnosis and the severity of the sleep disordered breathing. 2. The patient should be cautioned not to drive, work at heights, or operate dangerous or heavy equipment when tired or sleepy. Review and reiteration of good sleep hygiene measures should be pursued with any patient. 3. The patient will be seen in follow-up in the sleep clinic at [facility] for discussion of the test results, symptom and treatment compliance review, further management strategies, etc. The patient and her referring provider will be notified of the test results.   I certify that I have reviewed the entire raw data recording prior to the issuance of this report in accordance with the Standards of Accreditation of the American Academy of Sleep Medicine (AASM).  Huston Foley, MD, PhD [facility]) Diplomat, ABPN (Neurology and Sleep)             Technical Report:   General Information  Name: Kimberly Underwood, Kimberly Underwood BMI: 60.45 Physician: Huston Foley, MD  ID: [id number] Height: 67.0 in Technician: Margaretann Loveless, RPSGT  Sex: Female Weight: 268.0 lb Record: [id number]  Age: 34 [July 02, 1965] Date: 01/13/2022    Medical & Medication History    56 year old right-handed woman with an underlying medical history of arthritis, hypertension, vitamin D deficiency, overactive  bladder, anxiety and severe obesity with a BMI of over 40, who reports snoring and excessive daytime somnolence as well as witnessed apneas and morning headaches.  Lumigan, Advil, Saxenda, Benicar, Timoptic, Vitamin D   Sleep Disorder      Comments   The patient came into the sleep lab for a PSG. She had one restroom trip. EKG kept in NSR. Mild snoring. The patient slept lateral and supine. Most respiratory events were in REM no matter the position. All respiratory events scored with a 3% desat. All sleep stages witnessed.     Lights out: 09:28:17 PM Lights on: 04:56:07 AM   Time Total Supine  Side Prone Upright  Recording (TRT) 7h 28.22m 1h 3.38m 6h 24.61m 0h 0.66m 0h 0.36m  Sleep (TST) 6h 57.35m 0h 53.20m 6h 3.73m 0h 0.89m 0h 0.40m   Latency N1 N2 N3 REM Onset Per. Slp. Eff.  Actual 0h 0.83m 0h 1.57m 0h 21.81m 0h 54.37m 0h 8.25m 0h 16.37m 93.08%   Stg Dur Wake N1 N2 N3 REM  Total 31.0 9.0 209.0 75.0 124.0  Supine 10.0 4.0 37.0 0.0 12.5  Side 21.0 5.0 172.0 75.0 111.5  Prone 0.0 0.0 0.0 0.0 0.0  Upright 0.0 0.0 0.0 0.0 0.0   Stg % Wake N1 N2 N3 REM  Total 6.9 2.2 50.1 18.0 29.7  Supine 2.2 1.0 8.9 0.0 3.0  Side 4.7 1.2 41.2 18.0 26.7  Prone 0.0 0.0 0.0 0.0 0.0  Upright 0.0 0.0 0.0 0.0 0.0     Apnea Summary Sub Supine Side Prone Upright  Total 0 Total 0 0 0 0 0    REM 0 0 0 0 0    NREM 0 0 0 0 0  Obs 0 REM 0 0 0 0 0    NREM 0 0 0 0 0  Mix 0 REM 0 0 0 0 0    NREM 0 0 0 0 0  Cen 0 REM 0 0 0 0 0    NREM 0 0 0 0 0   Rera Summary Sub Supine Side Prone Upright  Total 0 Total 0 0 0 0 0    REM 0 0 0 0 0    NREM 0 0 0 0 0   Hypopnea Summary Sub Supine Side Prone Upright  Total 168 Total 168 18 150 0 0    REM 126 15 111 0 0    NREM 42 3 39 0 0   4% Hypopnea Summary Sub Supine Side Prone Upright  Total (4%) 134 Total 134 16 118 0 0    REM 112 15 97 0 0    NREM 22 1 21  0 0     AHI Total Obs Mix Cen  24.17 Apnea 0.00 0.00 0.00 0.00   Hypopnea 24.17 -- -- --  19.28 Hypopnea (4%) 19.28 -- -- --    Total Supine Side Prone Upright  Position AHI 24.17 20.19 24.76 0.00 0.00  REM AHI 60.97   NREM AHI 8.60   Position RDI 24.17 20.19 24.76 0.00 0.00  REM RDI 60.97   NREM RDI 8.60    4% Hypopnea Total Supine Side Prone Upright  Position AHI (4%) 19.28 17.94 19.48 0.00 0.00  REM AHI (4%) 54.19   NREM AHI (4%) 4.51   Position RDI (4%) 19.28 17.94 19.48 0.00 0.00  REM RDI (4%) 54.19   NREM RDI (4%) 4.51    Desaturation Information Threshold: 2% <100% <90% <80% <70% <60% <50% <40%  Supine 26.0  16.0 6.0 0.0 0.0 0.0 0.0  Side 182.0 69.0 1.0 0.0 0.0 0.0 0.0  Prone 0.0 0.0 0.0 0.0  0.0 0.0 0.0  Upright 0.0 0.0 0.0 0.0 0.0 0.0 0.0  Total 208.0 85.0 7.0 0.0 0.0 0.0 0.0  Index 28.4 11.6 1.0 0.0 0.0 0.0 0.0   Threshold: 3% <100% <90% <80% <70% <60% <50% <40%  Supine 18.0 16.0 6.0 0.0 0.0 0.0 0.0  Side 131.0 68.0 1.0 0.0 0.0 0.0 0.0  Prone 0.0 0.0 0.0 0.0 0.0 0.0 0.0  Upright 0.0 0.0 0.0 0.0 0.0 0.0 0.0  Total 149.0 84.0 7.0 0.0 0.0 0.0 0.0  Index 20.3 11.5 1.0 0.0 0.0 0.0 0.0   Threshold: 4% <100% <90% <80% <70% <60% <50% <40%  Supine 16.0 16.0 6.0 0.0 0.0 0.0 0.0  Side 95.0 62.0 1.0 0.0 0.0 0.0 0.0  Prone 0.0 0.0 0.0 0.0 0.0 0.0 0.0  Upright 0.0 0.0 0.0 0.0 0.0 0.0 0.0  Total 111.0 78.0 7.0 0.0 0.0 0.0 0.0  Index 15.2 10.6 1.0 0.0 0.0 0.0 0.0   Threshold: 3% <100% <90% <80% <70% <60% <50% <40%  Supine 18 16 6  0 0 0 0  Side 131 68 1 0 0 0 0  Prone 0 0 0 0 0 0 0  Upright 0 0 0 0 0 0 0  Total 149 84 7 0 0 0 0   Awakening/Arousal Information # of Awakenings 7  Wake after sleep onset 22.60m  Wake after persistent sleep 20.74m   Arousal Assoc. Arousals Index  Apneas 0 0.0  Hypopneas 15 2.2  Leg Movements 0 0.0  Snore 0 0.0  PTT Arousals 0 0.0  Spontaneous 39 5.6  Total 54 7.8  Leg Movement Information PLMS LMs Index  Total LMs during PLMS 0 0.0  LMs w/ Microarousals 0 0.0   LM LMs Index  w/ Microarousal 0 0.0  w/ Awakening 0 0.0  w/ Resp Event 0 0.0  Spontaneous 0 0.0  Total 0 0.0     Desaturation threshold setting: 3% Minimum desaturation setting: 10 seconds SaO2 nadir: 74% The longest event was a 97 sec obstructive Hypopnea with a minimum SaO2 of 86%. The lowest SaO2 was 74% associated with a 73 sec obstructive Hypopnea. EKG Rates EKG Avg Max Min  Awake 82 94 69  Asleep 77 92 69  EKG Events: Tachycardia

## 2022-01-22 ENCOUNTER — Ambulatory Visit: Payer: BC Managed Care – PPO

## 2022-01-22 ENCOUNTER — Telehealth: Payer: Self-pay | Admitting: *Deleted

## 2022-01-22 DIAGNOSIS — I209 Angina pectoris, unspecified: Secondary | ICD-10-CM | POA: Diagnosis not present

## 2022-01-22 DIAGNOSIS — I10 Essential (primary) hypertension: Secondary | ICD-10-CM | POA: Diagnosis not present

## 2022-01-22 DIAGNOSIS — R0609 Other forms of dyspnea: Secondary | ICD-10-CM | POA: Diagnosis not present

## 2022-01-22 NOTE — Telephone Encounter (Signed)
-----   Message from Star Age, MD sent at 01/21/2022  6:16 PM EDT ----- Patient referred by PCP, seen by me on 11/14/21, diagnostic PSG on 01/13/22.   Please call and notify the patient that the recent sleep study showed moderate to severe obstructive sleep apnea. I recommend treatment for this in the form of CPAP. This will require a repeat sleep study for proper titration and mask fitting and correct monitoring of the oxygen saturations. Please explain to patient. I have placed an order in the chart. Thanks.  Star Age, MD, PhD [facility])

## 2022-01-22 NOTE — Telephone Encounter (Signed)
Spoke to patient gave sleep study results. Informed patient of second sleep study needed with mask fitting and correct monitoring of the oxygen saturations. Pt was a little shocked  with results but understands she has to come back in for second sleep study . Pt expressed understanding and thanked me for calling

## 2022-01-23 ENCOUNTER — Telehealth: Payer: Self-pay | Admitting: Neurology

## 2022-01-23 NOTE — Telephone Encounter (Signed)
Patient returned my call.  CPAP- [employer] no auth req spoke to [place] ref # [id number]   She is scheduled at [facility] for 03/03/22 at 9 pm.  Mailed packet to the patient.

## 2022-01-23 NOTE — Telephone Encounter (Signed)
LVM for pt to call back to schedule   [employer] no auth req spoke to [place] ref # [id number]

## 2022-02-04 ENCOUNTER — Ambulatory Visit: Payer: BC Managed Care – PPO

## 2022-02-04 VITALS — BP 123/75 | HR 83 | Resp 16 | Ht 67.0 in | Wt 263.0 lb

## 2022-02-04 DIAGNOSIS — R0789 Other chest pain: Secondary | ICD-10-CM | POA: Diagnosis not present

## 2022-02-04 DIAGNOSIS — I10 Essential (primary) hypertension: Secondary | ICD-10-CM | POA: Diagnosis not present

## 2022-02-04 DIAGNOSIS — R0609 Other forms of dyspnea: Secondary | ICD-10-CM | POA: Diagnosis not present

## 2022-02-04 NOTE — Progress Notes (Signed)
Primary Physician/Referring:  Anne Ng, NP  Patient ID: Kimberly Underwood, female    DOB: 1966/04/04, 56 y.o.   MRN: [id number]  Chief Complaint  Patient presents with   Palpitations   Follow-up    6 week   Chest Pain   HPI:    Kimberly Underwood  is a 56 y.o. AA female with history of hypertension, anxiety.  At last visit she was seen for evaluation of chest pain and shortness of breath that had been ongoing for 1 year along with elevated blood pressure. She underwent echocardiogram that revealed LVEF 65-70% and exercise nuclear stress test that was a low risk study. Benicar HCT was increased for better blood pressure control.   She presents today for follow-up.  Overall, she is feeling well without any complaints.  Chest pain and shortness of breath have resolved.  Home blood pressure readings are well controlled.  She is exercising 3-4 times a week using treadmill and elliptical and is following a healthy diet. She denies chest pain, shortness of breath, palpitations, leg edema, orthopnea, PND, TIA/syncope.   Past Medical History:  Diagnosis Date   Anxiety    Arthritis 2014   CARPAL TUNNEL SYNDROME, BILATERAL 05/22/2010   Dental caries    Gout    History of bronchitis 10/25/2015   History of CT scan of head 07/2014   due to syncope, normal scan   History of echocardiogram 07/17/14   TTE, normal LV function, 55-60% EF   History of EKG 07/2014   PVCs, otherwise normal   History of nuclear stress test 07/2014   normal   HYPERTENSION 10/25/2008   Impaired fasting blood sugar    Normal cardiac stress test 07/17/14   Dr. Peter [place]   Overactive bladder    Vitamin D deficiency    Wears glasses    Past Surgical History:  Procedure Laterality Date   ABDOMINAL HYSTERECTOMY  09/2010   fibroid, partial hysterectomy    TOENAIL EXCISION Bilateral 2000's   "big toes" (12/23/2012)   WISDOM TOOTH EXTRACTION  ~ 1985   Family History  Problem Relation Age of Onset   Cancer Mother         breast cancer   Hypertension Father    Gout Father    Clotting disorder Father    Lung disease Father        listeria   Stroke Father    Arthritis Father    Heart disease Father    Migraines Sister    Hypertension Sister    Mental illness Sister    Sleep apnea Sister    Stroke Maternal Grandmother    Cancer Maternal Grandfather        lung cancer (smoking)   Heart disease Daughter        heart murmur   Diabetes Neg Hx     Social History   Tobacco Use   Smoking status: Never   Smokeless tobacco: Never  Substance Use Topics   Alcohol use: Not Currently   Marital Status: Single  ROS  Review of Systems  Cardiovascular:  Negative for chest pain, claudication, dyspnea on exertion, leg swelling, near-syncope, orthopnea, palpitations and syncope.   Objective  Blood pressure 123/75, pulse 83, resp. rate 16, height 5\' 7"  (1.702 m), weight 263 lb (119.3 kg), SpO2 98 %. Body mass index is 41.19 kg/m.     02/04/2022    3:21 PM 01/01/2022    4:09 PM 12/24/2021    2:39 PM  Vitals with BMI  Height 5\' 7"   5\' 7"   Weight 263 lbs  267 lbs  BMI 84.13  24.40  Systolic 102 725   Diastolic 75 86   Pulse 83 87      Physical Exam Constitutional:      Appearance: Normal appearance.  Cardiovascular:     Rate and Rhythm: Normal rate and regular rhythm.     Pulses:          Carotid pulses are 2+ on the right side and 2+ on the left side.      Radial pulses are 2+ on the right side and 2+ on the left side.       Popliteal pulses are 1+ on the right side and 1+ on the left side.       Dorsalis pedis pulses are 1+ on the right side and 1+ on the left side.       Posterior tibial pulses are 1+ on the right side.     Heart sounds: Normal heart sounds. No murmur heard. Pulmonary:     Effort: Pulmonary effort is normal.     Breath sounds: Normal breath sounds.  Chest:     Chest wall: No tenderness.  Abdominal:     General: Bowel sounds are normal.     Palpations: Abdomen is soft.   Musculoskeletal:     Right lower leg: No edema.     Left lower leg: No edema.  Neurological:     Mental Status: She is alert.    Medications and allergies  No Known Allergies   Medication list after today's encounter   Current Outpatient Medications:    bimatoprost (LUMIGAN) 0.01 % SOLN, Place 1 drop into both eyes at bedtime., Disp: , Rfl:    famotidine (PEPCID) 20 MG tablet, Take 1 tablet (20 mg total) by mouth 2 (two) times daily. For itching and rash., Disp: 30 tablet, Rfl: 0   Liraglutide -Weight Management (SAXENDA) 18 MG/3ML SOPN, Inject 3 mg into the skin daily., Disp: 15 mL, Rfl: 2   loratadine (CLARITIN) 10 MG tablet, Take 1 tablet (10 mg total) by mouth daily. For itching and rash., Disp: 15 tablet, Rfl: 0   olmesartan-hydrochlorothiazide (BENICAR HCT) 40-25 MG tablet, Take 1 tablet by mouth daily., Disp: 90 tablet, Rfl: 2   predniSONE (DELTASONE) 10 MG tablet, Take 1 tablet (10 mg total) by mouth 3 (three) times daily., Disp: 15 tablet, Rfl: 0   terbinafine (LAMISIL) 250 MG tablet, Take 1 tablet (250 mg total) by mouth daily., Disp: 90 tablet, Rfl: 0   timolol (TIMOPTIC) 0.5 % ophthalmic solution, SMARTSIG:In Eye(s), Disp: , Rfl:    Vitamin D, Ergocalciferol, (DRISDOL) 1.25 MG (50000 UNIT) CAPS capsule, TAKE 1 CAPSULE (50,000 UNITS TOTAL) BY MOUTH EVERY 7 (SEVEN) DAYS, Disp: 12 capsule, Rfl: 0  Laboratory examination:   Lab Results  Component Value Date   NA 142 12/21/2021   K 3.3 (L) 12/21/2021   CO2 26 12/21/2021   GLUCOSE 164 (H) 12/21/2021   BUN 13 12/21/2021   CREATININE 0.88 12/21/2021   CALCIUM 9.1 12/21/2021   GFRNONAA >60 12/21/2021       Latest Ref Rng & Units 12/21/2021    1:14 PM 09/30/2021    8:45 AM 05/03/2021    8:26 AM  CMP  Glucose 70 - 99 mg/dL 164  95  96   BUN 6 - 20 mg/dL 13  15  14    Creatinine 0.44 - 1.00 mg/dL 0.88  0.87  0.86   Sodium 135 - 145 mmol/L 142  138  140   Potassium 3.5 - 5.1 mmol/L 3.3  3.7  3.8   Chloride 98 - 111 mmol/L  110  104  103   CO2 22 - 32 mmol/L 26  26  31    Calcium 8.9 - 10.3 mg/dL 9.1  9.7  9.9   Total Protein 6.0 - 8.3 g/dL   7.7   Total Bilirubin 0.2 - 1.2 mg/dL   0.4   Alkaline Phos 39 - 117 U/L   74   AST 0 - 37 U/L   15   ALT 0 - 35 U/L   10       Latest Ref Rng & Units 12/21/2021    1:14 PM 09/02/2019    8:04 AM 08/24/2017    7:55 AM  CBC  WBC 4.0 - 10.5 K/uL 7.4  5.1  5.6   Hemoglobin 12.0 - 15.0 g/dL 08/26/2017  10.1  75.1   Hematocrit 36.0 - 46.0 % 41.6  38.9  38.8   Platelets 150 - 400 K/uL 345  326.0  327.0     Lipid Panel Recent Labs    09/30/21 0845  CHOL 127  TRIG 177.0*  LDLCALC 43  VLDL 35.4  HDL 48.50  CHOLHDL 3    HEMOGLOBIN A1C Lab Results  Component Value Date   HGBA1C 6.3 09/30/2021   MPG 131 07/23/2018   TSH Recent Labs    05/03/21 0826  TSH 0.81    External labs:     Radiology:    Cardiac Studies:   Echocardiogram 01/22/2022:  Normal LV systolic function with visual EF 65-70%. Left ventricle cavity  is normal in size. Mild concentric hypertrophy of the left ventricle.  Normal global wall motion. Normal diastolic filling pattern, normal LAP.  Structurally normal tricuspid valve with trace regurgitation. No evidence  of pulmonary hypertension.  No prior available for comparison.   Exercise Sestamibi stress test 01/20/2022: Exercise nuclear stress test was performed using Bruce protocol.   1 Day Rest and Stress images. Exercise time 01/22/2022 02seconds on Bruce protocol, achieved 4.66 METS, 86% of age-predicted maximum heart rate (APMHR).  Stress ECG negative for ischemia.   Normal myocardial perfusion without convincing evidence of reversible myocardial ischemia or prior infarct. Calculated LVEF 60%. Left ventricular size and wall motion preserved. Low risk study.  EKG:   EKG 12/24/21: Sinus Rhythm. Normal axis. Left atrial enlargement. Poor R-wave progression. Cannot rule out old anterior infarct.  Assessment     ICD-10-CM   1.  Atypical chest pain  R07.89     2. Primary hypertension  I10     3. Dyspnea on exertion  R06.09        No orders of the defined types were placed in this encounter.   No orders of the defined types were placed in this encounter.   There are no discontinued medications.    Recommendations:   CEANNA WAREING is a 56 y.o.  AA female with history of hypertension that presents today for evaluation of chest pain and shortness of breath.   Atypical chest pain Dyspnea on exertion Recent reviewed results of echocardiogram and nuclear exercise stress test with patient. Chest pain and dyspnea with exertion have resolved.  Feel that the symptoms during previous visit could have been related to elevated blood pressure or musculoskeletal in nature regarding chest pain.  No further cardiac work-up needed at this time. She has started an  exercise regimen and is working on weight loss and lifestyle modifications.  Primary hypertension Blood pressure is well controlled today.  She does continue to monitor her blood pressure at home and has had well-controlled home readings. Continue Benicar HCT as ordered.  No changes to medications today.  Advised patient that if she has recurrence of chest pain or shortness of breath to notify office.  Otherwise we will follow-up in 6 months sooner if needed.    Nori Riis, AGNP-C  02/04/2022, 3:34 PM Office: [phone] Pager: [phone]

## 2022-02-16 ENCOUNTER — Other Ambulatory Visit: Payer: Self-pay | Admitting: Nurse Practitioner

## 2022-02-16 DIAGNOSIS — E559 Vitamin D deficiency, unspecified: Secondary | ICD-10-CM

## 2022-02-20 ENCOUNTER — Ambulatory Visit (INDEPENDENT_AMBULATORY_CARE_PROVIDER_SITE_OTHER): Payer: BC Managed Care – PPO | Admitting: Podiatry

## 2022-02-20 DIAGNOSIS — M79674 Pain in right toe(s): Secondary | ICD-10-CM

## 2022-02-20 DIAGNOSIS — M79675 Pain in left toe(s): Secondary | ICD-10-CM

## 2022-02-20 DIAGNOSIS — B351 Tinea unguium: Secondary | ICD-10-CM

## 2022-02-20 MED ORDER — TERBINAFINE HCL 250 MG PO TABS: 250.0000 mg | ORAL_TABLET | Freq: Every day | ORAL | 0 refills | Status: AC

## 2022-02-24 ENCOUNTER — Encounter: Payer: Self-pay | Admitting: Podiatry

## 2022-02-24 NOTE — Progress Notes (Signed)
  Subjective:  Patient ID: Kimberly Underwood, female    DOB: 02/21/1966,  MRN: [id number]  Chief Complaint  Patient presents with   Follow-up    Patient is here for 3 month follow-up.    56 y.o. female presents with the above complaint. History confirmed with patient.  She is doing much better with the Lamisil oral medication so far Objective:  Physical Exam: warm, good capillary refill, no trophic changes or ulcerative lesions, normal DP and PT pulses, normal sensory exam, tinea pedis has resolved.  Improvement in onychomycosis   Assessment:   1. Pain due to onychomycosis of toenails of both feet      Plan:  Patient was evaluated and treated and all questions answered.  Doing much better after a course of Lamisil.  I recommended 1 additional course to completely eliminate this she has about 50% improvement so far.  Advised this may take time for the nails to grow out.  She does have a residual piece of nail from prior matricectomy she may consider removing this permanently at some point.  Rx sent to pharmacy for Lamisil and she will see me as needed for the future   Return if symptoms worsen or fail to improve.

## 2022-03-03 ENCOUNTER — Ambulatory Visit (INDEPENDENT_AMBULATORY_CARE_PROVIDER_SITE_OTHER): Payer: BC Managed Care – PPO | Admitting: Neurology

## 2022-03-03 DIAGNOSIS — R351 Nocturia: Secondary | ICD-10-CM

## 2022-03-03 DIAGNOSIS — G4733 Obstructive sleep apnea (adult) (pediatric): Secondary | ICD-10-CM | POA: Diagnosis not present

## 2022-03-03 DIAGNOSIS — G4719 Other hypersomnia: Secondary | ICD-10-CM

## 2022-03-03 DIAGNOSIS — R519 Headache, unspecified: Secondary | ICD-10-CM

## 2022-03-03 DIAGNOSIS — Z820 Family history of epilepsy and other diseases of the nervous system: Secondary | ICD-10-CM

## 2022-03-11 NOTE — Procedures (Unsigned)
Physician Interpretation:   REFERRED BY: Alysia Penna, NP   HISTORY: 56 year old right-handed woman with an underlying medical history of arthritis, hypertension, vitamin D deficiency, overactive bladder, anxiety and severe obesity with a BMI of over 40, who presents for a full night titration study. Her baseline sleep study from 01/13/22 showed moderate to severe obstructive sleep apnea, with a total AHI of 24.2/hour, REM AHI of 61/hour, supine AHI of 20.2/hour and O2 nadir of 74%.  Her Epworth sleepiness score is 11/24,?fatigue severity score is 38 out of 63. Height: 67 in Weight: 268 lb (BMI 41) Neck Size: 16 in   MEDICATIONS: Lumigan, Advil, Saxenda, Benicar, Timoptic, Vitamin D  TITRATION DETAILS (SEE ALSO TABLE AT THE END OF THE REPORT):  The patient was shown several different interfaces and was subsequently fitted with a small/medium fullface mask from [facility]). The patient was started on a pressure of 5 cm of water pressure and gradually titrated to a final titration pressure of 11 cm with EPR of 3.  She achieved a total sleep time of 35 minutes on the final pressure with an AHI of 1.7/h, with supine REM sleep achieved and O2 nadir of 94%. ***    EEG: Review of the EEG showed no abnormal electrical discharges and symmetrical bihemispheric findings.    EKG: The EKG revealed normal sinus rhythm (NSR).   AUDIO/VIDEO REVIEW: The audio and video review did not show any abnormal or unusual behaviors, movements, phonations or vocalizations. The patient took 1 bathroom break. Snoring was significantly reduced with CPAP therapy  POST-STUDY QUESTIONNAIRE: Post study, the patient indicated, that sleep was better than usual.  She also left very positive feedback for the acquisition sleep technologist.  IMPRESSION:   Obstructive sleep apnea (OSA)  RECOMMENDATIONS:   RECOMMENDATIONS:  This study demonstrates near-complete resolution of the patient's obstructive sleep apnea with  CPAP therapy. I will, therefore, recommend home CPAP therapy at a pressure of 11 cm with EPR of 3, via fullface mask or mask of choice, sized to fit.  The patient will be reminded to be fully compliant with PAP therapy to improve sleep related symptoms and decrease long term cardiovascular risks. The patient should be reminded, that it may take up to 3 months to get fully used to using PAP with all planned sleep. The earlier full compliance is achieved, the better long term compliance tends to be. Please note that untreated obstructive sleep apnea carries additional perioperative morbidity. Patients with significant obstructive sleep apnea should receive perioperative PAP therapy and the surgeons and particularly the anesthesiologist should be informed of the diagnosis and the severity of the sleep disordered breathing. Other treatment options may be limited, and may include (generally speaking) surgical options in selected patients or the use of an oral appliance in certain patients. Concomitant weight loss is recommended.  The patient should be cautioned not to drive, work at heights, or operate dangerous or heavy equipment when tired or sleepy. Review and reiteration of good sleep hygiene measures should be pursued with any patient. 3. The patient will be seen in follow-up in the sleep clinic at [facility] for discussion of the test results, symptom and treatment compliance review, further management strategies, etc. The patient and her referring provider will be notified of the test results.    I certify that I have reviewed the entire raw data recording prior to the issuance of this report in accordance with the Standards of Accreditation of the American Academy of Sleep Medicine (AASM).  Star Age, MD, PhD Medical Director, [facility]) Diplomat, ABPN (Neurology and Sleep)  ***Technical Report:   ***Titration Table:

## 2022-03-14 NOTE — Addendum Note (Signed)
Addended by: Huston Foley on: 03/14/2022 08:25 AM   Modules accepted: Orders

## 2022-03-18 ENCOUNTER — Telehealth: Payer: Self-pay | Admitting: *Deleted

## 2022-03-18 NOTE — Telephone Encounter (Addendum)
I spoke with the patient we discussed her CPAP titration results.  Patient understands she did well on CPAP during the latest sleep study.  She is amenable to proceeding with CPAP therapy at home.  She understands the process as far as receiving the machine and supplies from the DME company.  She understands the insurance compliance requirements which includes using the machine at least 4 hours (our goal is all night) at night and also being seen in our office between 30 and 90 days for an initial follow-up appointment.  Patient did not have a preference for DME company.  She will watch for a call from [facility] for setup.  She was also given their number to call if needed.  The patient's questions were answered.  She was very appreciative for the call and she will also await a separate call from our office to schedule her initial follow-up appointment.  CPAP referral faxed to [facility]. Received a receipt of confirmation.   Result sent to referring provider.

## 2022-03-18 NOTE — Telephone Encounter (Signed)
Pt will need initial f/u scheduled.

## 2022-03-18 NOTE — Telephone Encounter (Signed)
Pt was called, after checking DPR vm was left for pt to call and schedule her initial CPAP f/u within the next 80 days

## 2022-03-18 NOTE — Telephone Encounter (Signed)
-----   Message from Huston Foley, MD sent at 03/14/2022  8:25 AM EST ----- Patient referred by PCP, seen by me on 11/14/21, diagnostic PSG on 01/13/22.  Patient had a CPAP titration study on 03/03/22.  Please call and inform patient that I have entered an order for treatment with positive airway pressure (PAP) treatment for obstructive sleep apnea (OSA). She did well during the latest sleep study with CPAP. We will, therefore, arrange for a machine for home use through a DME (durable medical equipment) company of Her choice; and I will see the patient back in follow-up in about 10 weeks. Please also explain to the patient that I will be looking out for compliance data, which can be downloaded from the machine (stored on an SD card, that is inserted in the machine) or via remote access through a modem, that is built into the machine. At the time of the followup appointment we will discuss sleep study results and how it is going with PAP treatment at home. Please advise patient to bring Her machine at the time of the first FU visit, even though this is cumbersome. Bringing the machine for every visit after that will likely not be needed, but often helps for the first visit to troubleshoot if needed. Please re-enforce the importance of compliance with treatment and the need for [place] to monitor compliance data - often an insurance requirement and actually good feedback for the patient as far as how they are doing.  Also remind patient, that any interim PAP machine or mask issues should be first addressed with the DME company, as they can often help better with technical and mask fit issues. Please ask if patient has a preference regarding DME company.  Please also make sure, the patient has a follow-up appointment with me in about 10 weeks from the setup date, thanks. May see one of our nurse practitioners if needed for proper timing of the FU appointment.  Please fax or rout report to the referring provider. Thanks,    Huston Foley, MD, PhD [facility])

## 2022-03-21 NOTE — Telephone Encounter (Signed)
Pt called, she has been scheduled for her initial CPAP f/u she was told to bring her CPAP and power cord

## 2022-04-09 DIAGNOSIS — Z1231 Encounter for screening mammogram for malignant neoplasm of breast: Secondary | ICD-10-CM | POA: Diagnosis not present

## 2022-04-09 LAB — HM MAMMOGRAPHY

## 2022-04-14 ENCOUNTER — Ambulatory Visit (INDEPENDENT_AMBULATORY_CARE_PROVIDER_SITE_OTHER): Payer: BLUE CROSS/BLUE SHIELD | Admitting: Nurse Practitioner

## 2022-04-14 ENCOUNTER — Encounter: Payer: Self-pay | Admitting: Nurse Practitioner

## 2022-04-14 VITALS — BP 126/84 | HR 74 | Temp 96.5°F | Ht 67.0 in | Wt 270.6 lb

## 2022-04-14 DIAGNOSIS — I10 Essential (primary) hypertension: Secondary | ICD-10-CM | POA: Diagnosis not present

## 2022-04-14 DIAGNOSIS — E559 Vitamin D deficiency, unspecified: Secondary | ICD-10-CM

## 2022-04-14 DIAGNOSIS — E1165 Type 2 diabetes mellitus with hyperglycemia: Secondary | ICD-10-CM

## 2022-04-14 DIAGNOSIS — E781 Pure hyperglyceridemia: Secondary | ICD-10-CM | POA: Insufficient documentation

## 2022-04-14 DIAGNOSIS — E1169 Type 2 diabetes mellitus with other specified complication: Secondary | ICD-10-CM | POA: Insufficient documentation

## 2022-04-14 DIAGNOSIS — G4733 Obstructive sleep apnea (adult) (pediatric): Secondary | ICD-10-CM

## 2022-04-14 LAB — LIPID PANEL
Cholesterol: 136 mg/dL (ref 0–200)
HDL: 48.6 mg/dL (ref >39.00)
LDL Cholesterol: 64 mg/dL (ref 0–99)
NonHDL: 87.05
Total CHOL/HDL Ratio: 3
Triglycerides: 115 mg/dL (ref 0.0–149.0)
VLDL: 23 mg/dL (ref 0.0–40.0)

## 2022-04-14 LAB — BASIC METABOLIC PANEL
BUN: 13 mg/dL (ref 6–23)
CO2: 29 mEq/L (ref 19–32)
Calcium: 9.7 mg/dL (ref 8.4–10.5)
Chloride: 105 mEq/L (ref 96–112)
Creatinine, Ser: 0.86 mg/dL (ref 0.40–1.20)
GFR: 75.28 mL/min (ref >60.00)
Glucose, Bld: 97 mg/dL (ref 70–99)
Potassium: 4 mEq/L (ref 3.5–5.1)
Sodium: 142 mEq/L (ref 135–145)

## 2022-04-14 LAB — HEMOGLOBIN A1C: Hgb A1c MFr Bld: 6.6 % — ABNORMAL HIGH (ref 4.6–6.5)

## 2022-04-14 LAB — VITAMIN D 25 HYDROXY (VIT D DEFICIENCY, FRACTURES): VITD: 14.88 ng/mL — ABNORMAL LOW (ref 30.00–100.00)

## 2022-04-14 NOTE — Telephone Encounter (Signed)
Pt is calling. Stated she just got off the phone with [facility] and was told they don't have a prescription for a CPAP machine. Pt is requesting information be re-fax to [place].

## 2022-04-14 NOTE — Telephone Encounter (Addendum)
Ok this was faxed to them w/ receipt on 03/18/22. I just faxed the referral to [place] again asking for processing asap. Received a receipt of confirmation. Also called [facility]. They did not have record of the patient but apologized and will process asap.

## 2022-04-14 NOTE — Assessment & Plan Note (Signed)
>>  ASSESSMENT AND PLAN FOR SEVERE OBESITY (BMI >= 40) (HCC) WRITTEN ON 04/14/2022 11:43 AM BY Hayslee Casebolt LUM, NP  Weight gain despite use of saxenda injection at 3mg  Admits to lack of diet modification and no exercise regimen. Wt Readings from Last 3 Encounters:  04/14/22 270 lb 9.6 oz (122.7 kg)  02/04/22 263 lb (119.3 kg)  12/24/21 267 lb (121.1 kg)

## 2022-04-14 NOTE — Assessment & Plan Note (Signed)
Repeat hgbA1c at 6.6% Advised againa about importance of lifestyle modifications: heart healthy diet and exercise. Advised about possible complications of DM. F/up in 67months

## 2022-04-14 NOTE — Patient Instructions (Signed)
Maintain current med doses Go to lab

## 2022-04-14 NOTE — Assessment & Plan Note (Signed)
BP at goal with olmesartan/hctz BP Readings from Last 3 Encounters:  04/14/22 126/84  02/04/22 123/75  01/01/22 138/86    Repeat BMP: normal Maintain med dose

## 2022-04-14 NOTE — Assessment & Plan Note (Addendum)
Weight gain despite use of saxenda injection at 3mg  Admits to lack of diet modification and no exercise regimen. Wt Readings from Last 3 Encounters:  04/14/22 270 lb 9.6 oz (122.7 kg)  02/04/22 263 lb (119.3 kg)  12/24/21 267 lb (121.1 kg)

## 2022-04-14 NOTE — Progress Notes (Signed)
Established Patient Visit  Patient: Kimberly Underwood   DOB: [birth year]   57 y.o. Female  MRN: [id number] Visit Date: 04/14/2022  Subjective:    Chief Complaint  Patient presents with   Office Visit    HTN / Prediabetes/ Weigh management  Doesn't check BP  Pt fasting  Med forms    HPI DM (diabetes mellitus) (HCC) Repeat hgbA1c at 6.6% Advised againa about importance of lifestyle modifications: heart healthy diet and exercise. Advised about possible complications of DM. F/up in 34months  Hypertensive disorder BP at goal with olmesartan/hctz BP Readings from Last 3 Encounters:  04/14/22 126/84  02/04/22 123/75  01/01/22 138/86    Repeat BMP: normal Maintain med dose  Hypertriglyceridemia without hypercholesterolemia Repeat lipid panel: normal  Severe obesity (BMI >= 40) (HCC) Weight gain despite use of saxenda injection at 3mg  Admits to lack of diet modification and no exercise regimen. Wt Readings from Last 3 Encounters:  04/14/22 270 lb 9.6 oz (122.7 kg)  02/04/22 263 lb (119.3 kg)  12/24/21 267 lb (121.1 kg)     OSA (obstructive sleep apnea) Positive OSA Waiting for CPAP machine  Reviewed medical, surgical, and social history today  Medications: Outpatient Medications Prior to Visit  Medication Sig   bimatoprost (LUMIGAN) 0.01 % SOLN Place 1 drop into both eyes at bedtime.   famotidine (PEPCID) 20 MG tablet Take 1 tablet (20 mg total) by mouth 2 (two) times daily. For itching and rash.   Liraglutide -Weight Management (SAXENDA) 18 MG/3ML SOPN Inject 3 mg into the skin daily.   loratadine (CLARITIN) 10 MG tablet Take 1 tablet (10 mg total) by mouth daily. For itching and rash.   olmesartan-hydrochlorothiazide (BENICAR HCT) 40-25 MG tablet Take 1 tablet by mouth daily.   oxybutynin (DITROPAN) 5 MG tablet Take 5 mg by mouth 2 (two) times daily.   terbinafine (LAMISIL) 250 MG tablet Take 1 tablet (250 mg total) by mouth daily.   timolol (TIMOPTIC) 0.5  % ophthalmic solution SMARTSIG:In Eye(s)   Vitamin D, Ergocalciferol, (DRISDOL) 1.25 MG (50000 UNIT) CAPS capsule TAKE 1 CAPSULE (50,000 UNITS TOTAL) BY MOUTH EVERY 7 (SEVEN) DAYS   [DISCONTINUED] predniSONE (DELTASONE) 10 MG tablet Take 1 tablet (10 mg total) by mouth 3 (three) times daily. (Patient not taking: Reported on 04/14/2022)   No facility-administered medications prior to visit.   Reviewed past medical and social history.   ROS per HPI above      Objective:  BP 126/84 (BP Location: Right Arm, Patient Position: Sitting, Cuff Size: Normal)   Pulse 74   Temp (!) 96.5 F (35.8 C) (Temporal)   Ht 5\' 7"  (1.702 m)   Wt 270 lb 9.6 oz (122.7 kg)   SpO2 98%   BMI 42.38 kg/m      Physical Exam Vitals reviewed.  Cardiovascular:     Rate and Rhythm: Normal rate and regular rhythm.     Pulses: Normal pulses.     Heart sounds: Normal heart sounds.  Pulmonary:     Effort: Pulmonary effort is normal.     Breath sounds: Normal breath sounds.  Musculoskeletal:     Right lower leg: No edema.     Left lower leg: No edema.  Neurological:     Mental Status: She is alert and oriented to person, place, and time.     Results for orders placed or performed in visit on 04/14/22  Basic  metabolic panel  Result Value Ref Range   Sodium 142 135 - 145 mEq/L   Potassium 4.0 3.5 - 5.1 mEq/L   Chloride 105 96 - 112 mEq/L   CO2 29 19 - 32 mEq/L   Glucose, Bld 97 70 - 99 mg/dL   BUN 13 6 - 23 mg/dL   Creatinine, Ser 0.86 0.40 - 1.20 mg/dL   GFR 75.28 >60.00 mL/min   Calcium 9.7 8.4 - 10.5 mg/dL  VITAMIN D 25 Hydroxy (Vit-D Deficiency, Fractures)  Result Value Ref Range   VITD 14.88 (L) 30.00 - 100.00 ng/mL  Hemoglobin A1c  Result Value Ref Range   Hgb A1c MFr Bld 6.6 (H) 4.6 - 6.5 %  Lipid panel  Result Value Ref Range   Cholesterol 136 0 - 200 mg/dL   Triglycerides 115.0 0.0 - 149.0 mg/dL   HDL 48.60 >39.00 mg/dL   VLDL 23.0 0.0 - 40.0 mg/dL   LDL Cholesterol 64 0 - 99 mg/dL    Total CHOL/HDL Ratio 3    NonHDL 87.05       Assessment & Plan:    Problem List Items Addressed This Visit       Cardiovascular and Mediastinum   Hypertensive disorder - Primary    BP at goal with olmesartan/hctz BP Readings from Last 3 Encounters:  04/14/22 126/84  02/04/22 123/75  01/01/22 138/86    Repeat BMP: normal Maintain med dose      Relevant Orders   Basic metabolic panel (Completed)     Respiratory   OSA (obstructive sleep apnea)    Positive OSA Waiting for CPAP machine        Endocrine   DM (diabetes mellitus) ([place])    Repeat hgbA1c at 6.6% Advised againa about importance of lifestyle modifications: heart healthy diet and exercise. Advised about possible complications of DM. F/up in 63months      Relevant Orders   Hemoglobin A1c (Completed)     Other   Hypertriglyceridemia without hypercholesterolemia    Repeat lipid panel: normal      Relevant Orders   Lipid panel (Completed)   Severe obesity (BMI >= 40) (HCC)    Weight gain despite use of saxenda injection at 3mg  Admits to lack of diet modification and no exercise regimen. Wt Readings from Last 3 Encounters:  04/14/22 270 lb 9.6 oz (122.7 kg)  02/04/22 263 lb (119.3 kg)  12/24/21 267 lb (121.1 kg)         Other Visit Diagnoses     Vitamin D deficiency       Relevant Orders   VITAMIN D 25 Hydroxy (Vit-D Deficiency, Fractures) (Completed)      Return in about 3 months (around 07/14/2022) for DM.     Wilfred Lacy, NP

## 2022-04-14 NOTE — Assessment & Plan Note (Signed)
Repeat lipid panel: normal

## 2022-04-14 NOTE — Assessment & Plan Note (Signed)
Positive OSA Waiting for CPAP machine

## 2022-04-22 DIAGNOSIS — H2513 Age-related nuclear cataract, bilateral: Secondary | ICD-10-CM | POA: Diagnosis not present

## 2022-04-22 DIAGNOSIS — H401131 Primary open-angle glaucoma, bilateral, mild stage: Secondary | ICD-10-CM | POA: Diagnosis not present

## 2022-04-26 ENCOUNTER — Other Ambulatory Visit: Payer: Self-pay | Admitting: Nurse Practitioner

## 2022-04-26 DIAGNOSIS — I10 Essential (primary) hypertension: Secondary | ICD-10-CM

## 2022-05-04 ENCOUNTER — Emergency Department (HOSPITAL_BASED_OUTPATIENT_CLINIC_OR_DEPARTMENT_OTHER): Payer: BLUE CROSS/BLUE SHIELD | Admitting: Radiology

## 2022-05-04 ENCOUNTER — Other Ambulatory Visit: Payer: Self-pay

## 2022-05-04 ENCOUNTER — Emergency Department (HOSPITAL_BASED_OUTPATIENT_CLINIC_OR_DEPARTMENT_OTHER)
Admission: EM | Admit: 2022-05-04 | Discharge: 2022-05-04 | Disposition: A | Discharge status: Home / Self Care | Payer: BLUE CROSS/BLUE SHIELD | Attending: Emergency Medicine | Admitting: Emergency Medicine

## 2022-05-04 DIAGNOSIS — I10 Essential (primary) hypertension: Secondary | ICD-10-CM | POA: Insufficient documentation

## 2022-05-04 DIAGNOSIS — Z79899 Other long term (current) drug therapy: Secondary | ICD-10-CM | POA: Insufficient documentation

## 2022-05-04 DIAGNOSIS — R200 Anesthesia of skin: Secondary | ICD-10-CM | POA: Diagnosis not present

## 2022-05-04 DIAGNOSIS — R079 Chest pain, unspecified: Secondary | ICD-10-CM

## 2022-05-04 DIAGNOSIS — R0789 Other chest pain: Secondary | ICD-10-CM | POA: Diagnosis not present

## 2022-05-04 LAB — CBC
HCT: 37.6 % (ref 36.0–46.0)
Hemoglobin: 12.5 g/dL (ref 12.0–15.0)
MCH: 28.7 pg (ref 26.0–34.0)
MCHC: 33.2 g/dL (ref 30.0–36.0)
MCV: 86.2 fL (ref 80.0–100.0)
Platelets: 369 10*3/uL (ref 150–400)
RBC: 4.36 MIL/uL (ref 3.87–5.11)
RDW: 12.7 % (ref 11.5–15.5)
WBC: 8 10*3/uL (ref 4.0–10.5)
nRBC: 0 % (ref 0.0–0.2)

## 2022-05-04 LAB — BASIC METABOLIC PANEL
Anion gap: 7 (ref 5–15)
BUN: 14 mg/dL (ref 6–20)
CO2: 30 mmol/L (ref 22–32)
Calcium: 9.6 mg/dL (ref 8.9–10.3)
Chloride: 101 mmol/L (ref 98–111)
Creatinine, Ser: 0.81 mg/dL (ref 0.44–1.00)
GFR, Estimated: >60 mL/min (ref >60)
Glucose, Bld: 100 mg/dL — ABNORMAL HIGH (ref 70–99)
Potassium: 3.4 mmol/L — ABNORMAL LOW (ref 3.5–5.1)
Sodium: 138 mmol/L (ref 135–145)

## 2022-05-04 LAB — TROPONIN I (HIGH SENSITIVITY)
Troponin I (High Sensitivity): <2 ng/L (ref <18)
Troponin I (High Sensitivity): <2 ng/L (ref <18)

## 2022-05-04 NOTE — ED Triage Notes (Signed)
Patient arrives with complaints of chest pain and left arm numbness x1 day. Patient states rest chest pain a 4/10.Kimberly Underwood She also reports feeling lightheaded as well.     No vomiting/Diarrhea.

## 2022-05-04 NOTE — ED Notes (Signed)
Patient called to triage.  Patient family member states patient is in restroom.

## 2022-05-04 NOTE — Discharge Instructions (Addendum)
Your workups were reassuring today.  You can take tylenol/ibuprofen for pain. I recommend close follow-up with cardiology for reevaluation.  Please do not hesitate to return to emergency department if worrisome signs symptoms we discussed become apparent.

## 2022-05-04 NOTE — ED Provider Notes (Signed)
[facility] Provider Note   CSN: [id number] Arrival date & time: 05/04/22  1543     History  Chief Complaint  Patient presents with   Chest Pain   Numbness    Left arm    Kimberly Underwood is a 57 y.o. female with a past medical history of gout, hypertension presenting today for evaluation of chest pain.  Patient reports she started to have chest pain when she woke up yesterday morning.  She reports associated left arm numbness.  Pain is intermittent, located in the center of her chest, nonradiating.  States her pain increased with stress.  Patient also reports "swelling" on her shoulders bilaterally.  She denies any arm weakness or numbness, diaphoresis.  No fever, nausea, vomiting.  Denies any cardiac or pulmonary history beside hypertension.  She states her pain is 3 out of 10 at the moment.  Denies any leg swelling or pain.  No cough, recent travel or surgery.  No history of malignancy.   Chest Pain   Past Medical History:  Diagnosis Date   Anxiety    Arthritis 2014   CARPAL TUNNEL SYNDROME, BILATERAL 05/22/2010   Dental caries    Dyspnea on exertion 12/24/2021   Gout    History of bronchitis 10/25/2015   History of CT scan of head 07/2014   due to syncope, normal scan   History of echocardiogram 07/17/2014   TTE, normal LV function, 55-60% EF   History of EKG 07/2014   PVCs, otherwise normal   History of nuclear stress test 07/2014   normal   HYPERTENSION 10/25/2008   Impaired fasting blood sugar    Normal cardiac stress test 07/17/2014   Dr. Peter Martinique   Overactive bladder    Right lateral epicondylitis 03/09/2017   Vitamin D deficiency    Wears glasses    Past Surgical History:  Procedure Laterality Date   ABDOMINAL HYSTERECTOMY  09/2010   fibroid, partial hysterectomy    TOENAIL EXCISION Bilateral 2000's   "big toes" (12/23/2012)   WISDOM TOOTH EXTRACTION  ~ [place] Medications Prior to Admission  medications   Medication Sig Start Date End Date Taking? Authorizing Provider  bimatoprost (LUMIGAN) 0.01 % SOLN Place 1 drop into both eyes at bedtime.    [provider]  famotidine (PEPCID) 20 MG tablet Take 1 tablet (20 mg total) by mouth 2 (two) times daily. For itching and rash. 01/01/22   Nyoka Lint, PA-C  Liraglutide -Weight Management (SAXENDA) 18 MG/3ML SOPN Inject 3 mg into the skin daily. 01/07/22   Nche, Charlene Brooke, NP  loratadine (CLARITIN) 10 MG tablet Take 1 tablet (10 mg total) by mouth daily. For itching and rash. 01/01/22   Nyoka Lint, PA-C  olmesartan-hydrochlorothiazide (BENICAR HCT) 40-25 MG tablet Take 1 tablet by mouth daily. 12/24/21   Custovic, Collene Mares, DO  oxybutynin (DITROPAN) 5 MG tablet Take 5 mg by mouth 2 (two) times daily. 03/28/22   [provider]  terbinafine (LAMISIL) 250 MG tablet Take 1 tablet (250 mg total) by mouth daily. 02/20/22 05/21/22  Criselda Peaches, DPM  timolol (TIMOPTIC) 0.5 % ophthalmic solution SMARTSIG:In Eye(s) 11/13/21   [provider]  Vitamin D, Ergocalciferol, (DRISDOL) 1.25 MG (50000 UNIT) CAPS capsule TAKE 1 CAPSULE (50,000 UNITS TOTAL) BY MOUTH EVERY 7 (SEVEN) DAYS 02/17/22   Nche, Charlene Brooke, NP      Allergies    Patient has no known allergies.  Review of Systems   Review of Systems  Cardiovascular:  Positive for chest pain.    Physical Exam Updated Vital Signs BP 124/79 (BP Location: Right Arm)   Pulse 71   Temp 98.2 F (36.8 C) (Oral)   Resp 16   Ht 5\' 7"  (1.702 m)   Wt 122.5 kg   SpO2 95%   BMI 42.29 kg/m  Physical Exam Vitals and nursing note reviewed.  Constitutional:      Appearance: Normal appearance.  HENT:     Head: Normocephalic and atraumatic.     Mouth/Throat:     Mouth: Mucous membranes are moist.  Eyes:     General: No scleral icterus. Cardiovascular:     Rate and Rhythm: Normal rate and regular rhythm.     Pulses: Normal pulses.     Heart sounds: Normal heart  sounds.  Pulmonary:     Effort: Pulmonary effort is normal.     Breath sounds: Normal breath sounds.  Abdominal:     General: Abdomen is flat.     Palpations: Abdomen is soft.     Tenderness: There is no abdominal tenderness.  Musculoskeletal:        General: No deformity.  Skin:    General: Skin is warm.     Findings: No rash.  Neurological:     General: No focal deficit present.     Mental Status: She is alert.  Psychiatric:        Mood and Affect: Mood normal.     ED Results / Procedures / Treatments   Labs (all labs ordered are listed, but only abnormal results are displayed) Labs Reviewed  BASIC METABOLIC PANEL - Abnormal; Notable for the following components:      Result Value   Potassium 3.4 (*)    Glucose, Bld 100 (*)    All other components within normal limits  CBC  TROPONIN I (HIGH SENSITIVITY)  TROPONIN I (HIGH SENSITIVITY)    EKG EKG Interpretation  Date/Time:  Sunday May 04 2022 15:52:08 EST Ventricular Rate:  77 PR Interval:  170 QRS Duration: 78 QT Interval:  374 QTC Calculation: 423 R Axis:   54 Text Interpretation: Normal sinus rhythm Normal ECG When compared with ECG of 21-Dec-2021 14:19, No significant change since last tracing Confirmed by 23-Dec-2021 [phone]) on 05/04/2022 4:03:53 PM  Radiology DG Chest 2 View  Result Date: 05/04/2022 CLINICAL DATA:  Chest pain EXAM: CHEST - 2 VIEW COMPARISON:  Chest x-ray April 03, 2015 FINDINGS: The heart size and mediastinal contours are within normal limits. Both lungs are clear. The visualized skeletal structures are unremarkable. IMPRESSION: No active cardiopulmonary disease. Electronically Signed   By: April 05, 2015 III M.D.   On: 05/04/2022 16:33    Procedures Procedures    Medications Ordered in ED Medications - No data to display  ED Course/ Medical Decision Making/ A&P                             Medical Decision Making Amount and/or Complexity of Data Reviewed Labs:  ordered. Radiology: ordered.   This patient presents to the ED for chest pain, this involves an extensive number of treatment options, and is a complaint that carries with a high risk of complications and morbidity.  The differential diagnosis includes ACS, pericarditis, PE, pneumothorax, pneumonia, dissection.  This is not an exhaustive list.  Lab tests: I ordered and personally interpreted labs.  The pertinent  results include: WBC unremarkable. Hbg unremarkable. Platelets unremarkable. No electrolyte abnormalities noted. BUN, creatinine unremarkable. Delta trop negative.  Imaging studies: I ordered imaging studies. I personally reviewed, interpreted imaging and agree with the radiologist's interpretations. The results include: chest DG negative   Problem list/ ED course/ Critical interventions/ Medical management: HPI: See above Vital signs within normal range and stable throughout visit. Laboratory/imaging studies significant for: See above. On physical examination, patient is afebrile and appears in no acute distress. Exam without evidence of volume overload so doubt heart failure. EKG without signs of active ischemia. Given the timing of pain to ER presentation, delta troponin was negative so doubt NSTEMI. Presentation not consistent with acute PE (Wells low risk), pneumothorax (not visualized on chest xr), thoracic aortic dissection, pericarditis, tamponade, pneumonia (no infectious symptoms, clear chest xr), myocarditis (no recent illness, neg trop). HEART score:2 so plan to discharge patient home with cardiology follow up. I have reviewed the patient home medicines and have made adjustments as needed.  Cardiac monitoring/EKG: The patient was maintained on a cardiac monitor.  I personally reviewed and interpreted the cardiac monitor which showed an underlying rhythm of: sinus rhythm.  Additional history obtained: External records from outside source obtained and reviewed including:  Chart review including previous notes, labs, imaging.    Disposition Continued outpatient therapy. Follow-up with cardiology recommended for reevaluation of symptoms. Treatment plan discussed with patient.  Pt acknowledged understanding was agreeable to the plan. Worrisome signs and symptoms were discussed with patient, and patient acknowledged understanding to return to the ED if they noticed these signs and symptoms. Patient was stable upon discharge.   This chart was dictated using voice recognition software.  Despite best efforts to proofread,  errors can occur which can change the documentation meaning.          Final Clinical Impression(s) / ED Diagnoses Final diagnoses:  Chest pain, unspecified type    Rx / DC Orders ED Discharge Orders          Ordered    Ambulatory referral to Cardiology       Comments: If you have not heard from the Cardiology office within the next 72 hours please call [phone].   05/04/22 1945              Rex Kras, [place] 05/04/22 2342    Dorie Rank, MD 05/06/22 1030

## 2022-05-04 NOTE — ED Provider Notes (Incomplete)
[facility] Provider Note   CSN: [id number] Arrival date & time: 05/04/22  1543     History {Add pertinent medical, surgical, social history, OB history to HPI:1} Chief Complaint  Patient presents with   Chest Pain   Numbness    Left arm    Kimberly Underwood is a 57 y.o. female.   Chest Pain      Home Medications Prior to Admission medications   Medication Sig Start Date End Date Taking? Authorizing Provider  bimatoprost (LUMIGAN) 0.01 % SOLN Place 1 drop into both eyes at bedtime.    [provider]  famotidine (PEPCID) 20 MG tablet Take 1 tablet (20 mg total) by mouth 2 (two) times daily. For itching and rash. 01/01/22   Nyoka Lint, PA-C  Liraglutide -Weight Management (SAXENDA) 18 MG/3ML SOPN Inject 3 mg into the skin daily. 01/07/22   Nche, Charlene Brooke, NP  loratadine (CLARITIN) 10 MG tablet Take 1 tablet (10 mg total) by mouth daily. For itching and rash. 01/01/22   Nyoka Lint, PA-C  olmesartan-hydrochlorothiazide (BENICAR HCT) 40-25 MG tablet Take 1 tablet by mouth daily. 12/24/21   Custovic, Collene Mares, DO  oxybutynin (DITROPAN) 5 MG tablet Take 5 mg by mouth 2 (two) times daily. 03/28/22   [provider]  terbinafine (LAMISIL) 250 MG tablet Take 1 tablet (250 mg total) by mouth daily. 02/20/22 05/21/22  Criselda Peaches, DPM  timolol (TIMOPTIC) 0.5 % ophthalmic solution SMARTSIG:In Eye(s) 11/13/21   [provider]  Vitamin D, Ergocalciferol, (DRISDOL) 1.25 MG (50000 UNIT) CAPS capsule TAKE 1 CAPSULE (50,000 UNITS TOTAL) BY MOUTH EVERY 7 (SEVEN) DAYS 02/17/22   Nche, Charlene Brooke, NP      Allergies    Patient has no known allergies.    Review of Systems   Review of Systems  Cardiovascular:  Positive for chest pain.    Physical Exam Updated Vital Signs BP 128/84 (BP Location: Right Arm)   Pulse 74   Temp 98 F (36.7 C) (Oral)   Resp 18   Ht 5\' 7"  (1.702 m)   Wt 122.5 kg   SpO2 100%   BMI  42.29 kg/m  Physical Exam Vitals and nursing note reviewed.  Constitutional:      Appearance: Normal appearance.  HENT:     Head: Normocephalic and atraumatic.     Mouth/Throat:     Mouth: Mucous membranes are moist.  Eyes:     General: No scleral icterus. Cardiovascular:     Rate and Rhythm: Normal rate and regular rhythm.     Pulses: Normal pulses.     Heart sounds: Normal heart sounds.  Pulmonary:     Effort: Pulmonary effort is normal.     Breath sounds: Normal breath sounds.  Abdominal:     General: Abdomen is flat.     Palpations: Abdomen is soft.     Tenderness: There is no abdominal tenderness.  Musculoskeletal:        General: No deformity.  Skin:    General: Skin is warm.     Findings: No rash.  Neurological:     General: No focal deficit present.     Mental Status: She is alert.  Psychiatric:        Mood and Affect: Mood normal.     ED Results / Procedures / Treatments   Labs (all labs ordered are listed, but only abnormal results are displayed) Labs Reviewed  BASIC METABOLIC PANEL - Abnormal; Notable  for the following components:      Result Value   Potassium 3.4 (*)    Glucose, Bld 100 (*)    All other components within normal limits  CBC  TROPONIN I (HIGH SENSITIVITY)  TROPONIN I (HIGH SENSITIVITY)    EKG EKG Interpretation  Date/Time:  Sunday May 04 2022 15:52:08 EST Ventricular Rate:  77 PR Interval:  170 QRS Duration: 78 QT Interval:  374 QTC Calculation: 423 R Axis:   54 Text Interpretation: Normal sinus rhythm Normal ECG When compared with ECG of 21-Dec-2021 14:19, No significant change since last tracing Confirmed by Dorie Rank [phone]) on 05/04/2022 4:03:53 PM  Radiology DG Chest 2 View  Result Date: 05/04/2022 CLINICAL DATA:  Chest pain EXAM: CHEST - 2 VIEW COMPARISON:  Chest x-ray April 03, 2015 FINDINGS: The heart size and mediastinal contours are within normal limits. Both lungs are clear. The visualized skeletal  structures are unremarkable. IMPRESSION: No active cardiopulmonary disease. Electronically Signed   By: Dorise Bullion III M.D.   On: 05/04/2022 16:33    Procedures Procedures  {Document cardiac monitor, telemetry assessment procedure when appropriate:1}  Medications Ordered in ED Medications - No data to display  ED Course/ Medical Decision Making/ A&P   {   Click here for ABCD2, HEART and other calculatorsREFRESH Note before signing :1}                          Medical Decision Making Amount and/or Complexity of Data Reviewed Labs: ordered. Radiology: ordered.   This patient presents to the ED for chest pain, this involves an extensive number of treatment options, and is a complaint that carries with a high risk of complications and morbidity.  The differential diagnosis includes ACS, pericarditis, PE, pneumothorax, pneumonia.  This is not an exhaustive list.  Lab tests: I ordered and personally interpreted labs.  The pertinent results include: WBC unremarkable. Hbg unremarkable. Platelets unremarkable. No electrolyte abnormalities noted. BUN, creatinine unremarkable.  Delta Trop negative.  Imaging studies: I ordered imaging studies. I personally reviewed, interpreted imaging and agree with the radiologist's interpretations. The results include: Chest x-ray negative.  Problem list/ ED course/ Critical interventions/ Medical management: HPI: See above Vital signs within normal range and stable throughout visit. Laboratory/imaging studies significant for: See above. On physical examination, patient is afebrile and appears in no acute distress. Exam without evidence of volume overload so doubt heart failure. EKG without signs of active ischemia. Given the timing of pain to ER presentation, delta troponin was negative so doubt NSTEMI. Presentation not consistent with acute PE (Wells low risk ),pneumothorax (not visualized on chest xr), thoracic aortic dissection, pericarditis,  tamponade, pneumonia (no infectious symptoms, clear chest xr), myocarditis (no recent illness, neg trop). HEART score:2 so plan to discharge patient home with cardiology follow up. I discussed case with the  attending Dr. Tomi Bamberger who agreed with the plan. I have reviewed the patient home medicines and have made adjustments as needed.  Cardiac monitoring/EKG: The patient was maintained on a cardiac monitor.  I personally reviewed and interpreted the cardiac monitor which showed an underlying rhythm of: sinus rhythm.  Additional history obtained: External records from outside source obtained and reviewed including: Chart review including previous notes, labs, imaging.  Disposition Continued outpatient therapy. Follow-up with cardiology recommended for reevaluation of symptoms. Treatment plan discussed with patient.  Pt acknowledged understanding was agreeable to the plan. Worrisome signs and symptoms were discussed with patient, and patient  acknowledged understanding to return to the ED if they noticed these signs and symptoms. Patient was stable upon discharge.   This chart was dictated using voice recognition software.  Despite best efforts to proofread,  errors can occur which can change the documentation meaning.    {Document critical care time when appropriate:1} {Document review of labs and clinical decision tools ie heart score, Chads2Vasc2 etc:1}  {Document your independent review of radiology images, and any outside records:1} {Document your discussion with family members, caretakers, and with consultants:1} {Document social determinants of health affecting pt's care:1} {Document your decision making why or why not admission, treatments were needed:1} Final Clinical Impression(s) / ED Diagnoses Final diagnoses:  Chest pain, unspecified type    Rx / DC Orders ED Discharge Orders          Ordered    Ambulatory referral to Cardiology       Comments: If you have not heard from the  Cardiology office within the next 72 hours please call [phone].   05/04/22 1945

## 2022-05-05 ENCOUNTER — Telehealth: Payer: Self-pay

## 2022-05-05 NOTE — Telephone Encounter (Signed)
Transition Care Management Follow-up Telephone Call Date of discharge and from where: 05/04/22 [facility]. Dx: Chest pain How have you been since you were released from the hospital? Doing ok, I think I just overdid it with the activities I was doing. Any questions or concerns? No  Items Reviewed: Did the pt receive and understand the discharge instructions provided? Yes  Medications obtained and verified? No  Other? No  Any new allergies since your discharge? No  Dietary orders reviewed? No Do you have support at home? Yes    Follow up appointments reviewed:  PCP Hospital f/u appt confirmed? No  Scheduled to see - on - @ -. [facility] f/u appt confirmed? Yes  Scheduled to see Cardiology on unknown @ unknown. Are transportation arrangements needed? No  If their condition worsens, is the pt aware to call PCP or go to the Emergency Dept.? Yes Was the patient provided with contact information for the PCP's office or ED? Yes Was to pt encouraged to call back with questions or concerns? Yes  Angeline Slim, RN, BSN RN Clinical Supervisor [facility] [employer]

## 2022-05-06 NOTE — Telephone Encounter (Signed)
Cpap order emailed to [email or web address] per pt request

## 2022-05-08 ENCOUNTER — Ambulatory Visit: Payer: BC Managed Care – PPO | Admitting: Neurology

## 2022-05-27 ENCOUNTER — Encounter (HOSPITAL_BASED_OUTPATIENT_CLINIC_OR_DEPARTMENT_OTHER): Payer: Self-pay | Admitting: Cardiology

## 2022-05-27 ENCOUNTER — Ambulatory Visit (INDEPENDENT_AMBULATORY_CARE_PROVIDER_SITE_OTHER): Payer: BC Managed Care – PPO | Admitting: Cardiology

## 2022-05-27 VITALS — BP 128/82 | HR 78 | Ht 67.0 in | Wt 265.6 lb

## 2022-05-27 DIAGNOSIS — R0789 Other chest pain: Secondary | ICD-10-CM

## 2022-05-27 DIAGNOSIS — R072 Precordial pain: Secondary | ICD-10-CM

## 2022-05-27 DIAGNOSIS — E119 Type 2 diabetes mellitus without complications: Secondary | ICD-10-CM | POA: Diagnosis not present

## 2022-05-27 DIAGNOSIS — Z7189 Other specified counseling: Secondary | ICD-10-CM | POA: Diagnosis not present

## 2022-05-27 DIAGNOSIS — I10 Essential (primary) hypertension: Secondary | ICD-10-CM

## 2022-05-27 MED ORDER — METOPROLOL TARTRATE 25 MG PO TABS: 25.0000 mg | ORAL_TABLET | Freq: Once | ORAL | 0 refills | Status: DC

## 2022-05-27 NOTE — Patient Instructions (Signed)
Medication Instructions:  Metoprolol Tartrate 25 mg 2hrs prior to test  *If you need a refill on your cardiac medications before your next appointment, please call your pharmacy*   Lab Work: BMET 1 week before CT.  If you have labs (blood work) drawn today and your tests are completely normal, you will receive your results only by: [place] (if you have MyChart) OR A paper copy in the mail If you have any lab test that is abnormal or we need to change your treatment, we will call you to review the results.   Testing/Procedures:   Your cardiac CT will be scheduled at one of the below locations:   [facility] [address] [place], [place] [address] [phone]  If scheduled at [facility], please arrive at the [facility] and Children's Entrance (Entrance C2) of [facility] 30 minutes prior to test start time. You can use the FREE valet parking offered at entrance C (encouraged to control the heart rate for the test)  Proceed to the [facility] Radiology Department (first floor) to check-in and test prep.  All radiology patients and guests should use entrance C2 at [facility], accessed from [facility], even though the hospital's physical address listed is [address]      Please follow these instructions carefully (unless otherwise directed):   On the Night Before the Test: Be sure to Drink plenty of water. Do not consume any caffeinated/decaffeinated beverages or chocolate 12 hours prior to your test. Do not take any antihistamines 12 hours prior to your test. If the patient has contrast allergy:  On the Day of the Test: Drink plenty of water until 1 hour prior to the test. Do not eat any food 1 hour prior to test. You may take your regular medications prior to the test.  Take metoprolol (Lopressor) two hours prior to test. If you take Furosemide/Hydrochlorothiazide/Spironolactone, please HOLD on the  morning of the test. FEMALES- please wear underwire-free bra if available, avoid dresses & tight clothing    After the Test: Drink plenty of water. After receiving IV contrast, you may experience a mild flushed feeling. This is normal. On occasion, you may experience a mild rash up to 24 hours after the test. This is not dangerous. If this occurs, you can take Benadryl 25 mg and increase your fluid intake. If you experience trouble breathing, this can be serious. If it is severe call 911 IMMEDIATELY. If it is mild, please call our office. If you take any of these medications: Glipizide/Metformin, Avandament, Glucavance, please do not take 48 hours after completing test unless otherwise instructed.  We will call to schedule your test 2-4 weeks out understanding that some insurance companies will need an authorization prior to the service being performed.   For non-scheduling related questions, please contact the cardiac imaging nurse navigator should you have any questions/concerns: Marchia Bond, Cardiac Imaging Nurse Navigator Gordy Clement, Cardiac Imaging Nurse Navigator [place] [facility] Direct Office Dial: [phone]   For scheduling needs, including cancellations and rescheduling, please call [place], [phone].    Follow-Up: At [facility], you and your health needs are our priority.  As part of our continuing mission to provide you with exceptional heart care, we have created designated Provider Care Teams.  These Care Teams include your primary Cardiologist (physician) and Advanced Practice Providers (APPs -  Physician Assistants and Nurse Practitioners) who all work together to provide you  with the care you need, when you need it.  We recommend signing up for the patient portal called "MyChart".  Sign up information is provided on this After Visit Summary.  MyChart is used to connect with patients for Virtual Visits (Telemedicine).  Patients  are able to view lab/test results, encounter notes, upcoming appointments, etc.  Non-urgent messages can be sent to your provider as well.   To learn more about what you can do with MyChart, go to [email or web address].    Your next appointment:   8 week(s)  Provider:   Buford Dresser, MD    Other Instructions None

## 2022-05-27 NOTE — Progress Notes (Signed)
Cardiology Office Note:    Date:  05/28/2022   ID:  Kimberly Underwood, DOB October 04, 1965, MRN [id number]  PCP:  Flossie Buffy, NP  Cardiologist:  Buford Dresser, MD  Referring MD: Rex Kras, PA   CC: new patient consultation for chest pain  History of Present Illness:    Kimberly Underwood is a 57 y.o. female with a hx of gout, hyperlipidemia, diabetes, and hypertension who is seen as a new consult at the request of Rex Kras, [place] for the evaluation and management of episodes of chest pain.  She was seen in the ED for chest pain with numbness of the L arm. EKG was normal. Troponin was negative.   Cardiovascular risk factors: Prior clinical ASCVD: none Comorbid conditions: hypertension, hyperlipidemia, diabetes Metabolic syndrome/Obesity: obesity  Chronic inflammatory conditions: Tobacco use history: never Family history: Mother had CAD, Father had skipped beats  Prior pertinent testing and/or incidental findings: stress test and Echo in October (normal) Exercise level: She does not exercise Current diet: eats too much and eats the wrong things.  She describes the chest pain that she was seen for as starting on chest pain that then had numbness that spread to her L arm, along with tingling sensation and some shortness of breath. The numbness had occurred before but the chest pin had never occurred before. The pain felt like a constant squeezing that hung around for a couple days. She felt some dizziness, like she was "floating". She denies any new medications or sickness prior to this episode.  In October she was feeling he numbness which caused hem to run the stress test and echocardiogram. Both came back as normal.   She initially had some weight loss with the Saxenda, but then it stopped. She was recently diagnosed as a type two diabetic.   She denies any palpitations, chest pain, shortness of breath, or peripheral edema. No headaches, syncope, orthopnea, or PND.   (+)  lightheadedness (+) shortness of breath  Past Medical History:  Diagnosis Date   Anxiety    Arthritis 2014   CARPAL TUNNEL SYNDROME, BILATERAL 05/22/2010   Dental caries    Dyspnea on exertion 12/24/2021   Gout    History of bronchitis 10/25/2015   History of CT scan of head 07/2014   due to syncope, normal scan   History of echocardiogram 07/17/2014   TTE, normal LV function, 55-60% EF   History of EKG 07/2014   PVCs, otherwise normal   History of nuclear stress test 07/2014   normal   HYPERTENSION 10/25/2008   Impaired fasting blood sugar    Normal cardiac stress test 07/17/2014   Dr. Peter Martinique   Overactive bladder    Right lateral epicondylitis 03/09/2017   Vitamin D deficiency    Wears glasses     Past Surgical History:  Procedure Laterality Date   ABDOMINAL HYSTERECTOMY  09/2010   fibroid, partial hysterectomy    TOENAIL EXCISION Bilateral 2000's   "big toes" (12/23/2012)   WISDOM TOOTH EXTRACTION  ~ 1985    Current Medications: Current Outpatient Medications on File Prior to Visit  Medication Sig   bimatoprost (LUMIGAN) 0.01 % SOLN Place 1 drop into both eyes at bedtime.   famotidine (PEPCID) 20 MG tablet Take 1 tablet (20 mg total) by mouth 2 (two) times daily. For itching and rash.   Liraglutide -Weight Management (SAXENDA) 18 MG/3ML SOPN Inject 3 mg into the skin daily.   loratadine (CLARITIN) 10 MG tablet Take 1  tablet (10 mg total) by mouth daily. For itching and rash.   olmesartan-hydrochlorothiazide (BENICAR HCT) 40-25 MG tablet Take 1 tablet by mouth daily.   oxybutynin (DITROPAN) 5 MG tablet Take 5 mg by mouth 2 (two) times daily.   timolol (TIMOPTIC) 0.5 % ophthalmic solution SMARTSIG:In Eye(s)   Vitamin D, Ergocalciferol, (DRISDOL) 1.25 MG (50000 UNIT) CAPS capsule TAKE 1 CAPSULE (50,000 UNITS TOTAL) BY MOUTH EVERY 7 (SEVEN) DAYS   No current facility-administered medications on file prior to visit.     Allergies:   Patient has no known  allergies.   Social History   Tobacco Use   Smoking status: Never   Smokeless tobacco: Never  Vaping Use   Vaping Use: Never used  Substance Use Topics   Alcohol use: Not Currently   Drug use: No    Family History: family history includes Arthritis in her father; Cancer in her maternal grandfather and mother; Clotting disorder in her father; Gout in her father; Heart disease in her daughter and father; Hypertension in her father and sister; Lung disease in her father; Mental illness in her sister; Migraines in her sister; Sleep apnea in her sister; Stroke in her father and maternal grandmother. There is no history of Diabetes.  ROS:   Please see the history of present illness.  Additional pertinent ROS: Constitutional: Negative for chills, fever, night sweats, unintentional weight loss  HENT: Negative for ear pain and hearing loss.   Eyes: Negative for loss of vision and eye pain.  Respiratory: Negative for cough, sputum, wheezing.   Cardiovascular: See HPI. Gastrointestinal: Negative for abdominal pain, melena, and hematochezia.  Genitourinary: Negative for dysuria and hematuria.  Musculoskeletal: Negative for falls and myalgias.  Skin: Negative for itching and rash.  Neurological: Negative for focal weakness, focal sensory changes and loss of consciousness.  Endo/Heme/Allergies: Does not bruise/bleed easily.     EKGs/Labs/Other Studies Reviewed:    The following studies were reviewed today: PCV ECHO 01/24/2022: Normal LV systolic function with visual EF 65-70%. Left ventricle cavity  is normal in size. Mild concentric hypertrophy of the left ventricle.  Normal global wall motion. Normal diastolic filling pattern, normal LAP.  Structurally normal tricuspid valve with trace regurgitation. No evidence  of pulmonary hypertension.  No prior available for comparison.   Stress Test 01/20/2022: Exercise nuclear stress test was performed using Bruce protocol.   1 Day Rest and  Stress images. Exercise time 27mnutes 02seconds on Bruce protocol, achieved 4.66 METS, 86% of age-predicted maximum heart rate (APMHR).  Stress ECG negative for ischemia.   Normal myocardial perfusion without convincing evidence of reversible myocardial ischemia or prior infarct. Calculated LVEF 60%. Left ventricular size and wall motion preserved. Low risk study.  EKG:  EKG is personally reviewed.   05/27/2022: NSR at 78 bpm  Recent Labs: 12/21/2021: Magnesium 2.1 05/04/2022: BUN 14; Creatinine, Ser 0.81; Hemoglobin 12.5; Platelets 369; Potassium 3.4; Sodium 138  Recent Lipid Panel    Component Value Date/Time   CHOL 136 04/14/2022 0821   TRIG 115.0 04/14/2022 0821   HDL 48.60 04/14/2022 0821   CHOLHDL 3 04/14/2022 0821   VLDL 23.0 04/14/2022 0821   LDLCALC 64 04/14/2022 0821    Physical Exam:    VS:  BP 128/82 (BP Location: Left Arm, Patient Position: Sitting, Cuff Size: Large)   Pulse 78   Ht 5' 7"$  (1.702 m)   Wt 265 lb 9.6 oz (120.5 kg)   SpO2 93%   BMI 41.60 kg/m  Wt Readings from Last 3 Encounters:  05/27/22 265 lb 9.6 oz (120.5 kg)  05/04/22 270 lb (122.5 kg)  04/14/22 270 lb 9.6 oz (122.7 kg)    GEN: Well nourished, well developed in no acute distress HEENT: Normal, moist mucous membranes NECK: No JVD CARDIAC: regular rhythm, normal S1 and S2, no rubs or gallops. No murmur. VASCULAR: Radial and DP pulses 2+ bilaterally. No carotid bruits RESPIRATORY:  Clear to auscultation without rales, wheezing or rhonchi  ABDOMEN: Soft, non-tender, non-distended MUSCULOSKELETAL:  Ambulates independently SKIN: Warm and dry, no edema NEUROLOGIC:  Alert and oriented x 3. No focal neuro deficits noted. PSYCHIATRIC:  Normal affect    ASSESSMENT:    1. Precordial pain   2. Primary hypertension   3. Type 2 diabetes mellitus without complication, without long-term current use of insulin (HCC)   4. Cardiac risk counseling    PLAN:    Chest pain -discussed treadmill  stress, nuclear stress/lexiscan, and CT coronary angiography. Discussed pros and cons of each, including but not limited to false positive/false negative risk, radiation risk, and risk of IV contrast dye. Based on shared decision making, decision was made to pursue CT coronary angiography. -will give one time dose of metoprolol 2 hours prior to scheduled test -counseled on need to get BMET prior to test -counseled on use of sublingual nitroglycerin and its importance to a good test -reviewed red flag warning signs that need immediate medical attention   Hypertension -near goal of <130/80 -continue olmesartan-HCTZ  Type II diabetes -on liraglutide -not on statin, use results of CT for management strategy -last A1c 6.6  Cardiac risk counseling and prevention recommendations: -recommend heart healthy/Mediterranean diet, with whole grains, fruits, vegetable, fish, lean meats, nuts, and olive oil. Limit salt. -recommend moderate walking, 3-5 times/week for 30-50 minutes each session. Aim for at least 150 minutes.week. Goal should be pace of 3 miles/hours, or walking 1.5 miles in 30 minutes -recommend avoidance of tobacco products. Avoid excess alcohol. -ASCVD risk score: The 10-year ASCVD risk score (Arnett DK, et al., 2019) is: 10.2%   Values used to calculate the score:     Age: 71 years     Sex: Female     Is Non-Hispanic African American: Yes     Diabetic: Yes     Tobacco smoker: No     Systolic Blood Pressure: 0000000 mmHg     Is BP treated: Yes     HDL Cholesterol: 48.6 mg/dL     Total Cholesterol: 136 mg/dL    Plan for follow up: 6-8 weeks  Buford Dresser, MD, PhD, [facility] at [facility] at [facility] [address], [place], [place] [address] [phone]   Medication Adjustments/Labs and Tests Ordered: Current medicines are reviewed at length with the patient today.  Concerns  regarding medicines are outlined above.  Orders Placed This Encounter  Procedures   CT CORONARY MORPH W/CTA COR W/SCORE W/CA W/CM &/OR WO/CM   Basic Metabolic Panel (BMET)   EKG 12-Lead   Meds ordered this encounter  Medications   metoprolol tartrate (LOPRESSOR) 25 MG tablet    Sig: Take 1 tablet (25 mg total) by mouth once for 1 dose.    Dispense:  1 tablet    Refill:  0    Patient Instructions  Medication Instructions:  Metoprolol Tartrate 25 mg 2hrs prior to test  *If you need a refill on your cardiac medications  before your next appointment, please call your pharmacy*   Lab Work: BMET 1 week before CT.  If you have labs (blood work) drawn today and your tests are completely normal, you will receive your results only by: [place] (if you have MyChart) OR A paper copy in the mail If you have any lab test that is abnormal or we need to change your treatment, we will call you to review the results.   Testing/Procedures:   Your cardiac CT will be scheduled at one of the below locations:   [facility] [address] [place], [place] [address] [phone]  If scheduled at [facility], please arrive at the [facility] and Children's Entrance (Entrance C2) of [facility] 30 minutes prior to test start time. You can use the FREE valet parking offered at entrance C (encouraged to control the heart rate for the test)  Proceed to the [facility] Radiology Department (first floor) to check-in and test prep.  All radiology patients and guests should use entrance C2 at [facility] accessed from [facility], even though the hospital's physical address listed is [address].      Please follow these instructions carefully (unless otherwise directed):   On the Night Before the Test: Be sure to Drink plenty of water. Do not consume any caffeinated/decaffeinated beverages or chocolate 12 hours prior to your  test. Do not take any antihistamines 12 hours prior to your test. If the patient has contrast allergy:  On the Day of the Test: Drink plenty of water until 1 hour prior to the test. Do not eat any food 1 hour prior to test. You may take your regular medications prior to the test.  Take metoprolol (Lopressor) two hours prior to test. If you take Furosemide/Hydrochlorothiazide/Spironolactone, please HOLD on the morning of the test. FEMALES- please wear underwire-free bra if available, avoid dresses & tight clothing    After the Test: Drink plenty of water. After receiving IV contrast, you may experience a mild flushed feeling. This is normal. On occasion, you may experience a mild rash up to 24 hours after the test. This is not dangerous. If this occurs, you can take Benadryl 25 mg and increase your fluid intake. If you experience trouble breathing, this can be serious. If it is severe call 911 IMMEDIATELY. If it is mild, please call our office. If you take any of these medications: Glipizide/Metformin, Avandament, Glucavance, please do not take 48 hours after completing test unless otherwise instructed.  We will call to schedule your test 2-4 weeks out understanding that some insurance companies will need an authorization prior to the service being performed.   For non-scheduling related questions, please contact the cardiac imaging nurse navigator should you have any questions/concerns: Marchia Bond, Cardiac Imaging Nurse Navigator Gordy Clement, Cardiac Imaging Nurse Navigator [place] [facility] Direct Office Dial: [phone]   For scheduling needs, including cancellations and rescheduling, please call [place], [phone].    Follow-Up: At [facility], you and your health needs are our priority.  As part of our continuing mission to provide you with exceptional heart care, we have created designated Provider Care Teams.  These Care Teams include  your primary Cardiologist (physician) and Advanced Practice Providers (APPs -  Physician Assistants and Nurse Practitioners) who all work together to provide you with the care you need, when you need it.  We recommend signing up for the patient portal called "MyChart".  Sign up information is provided on this After Visit Summary.  MyChart is used to connect with patients for Virtual Visits (Telemedicine).  Patients are able to view lab/test results, encounter notes, upcoming appointments, etc.  Non-urgent messages can be sent to your provider as well.   To learn more about what you can do with MyChart, go to [email or web address].    Your next appointment:   8 week(s)  Provider:   Buford Dresser, MD    Other Instructions None     Burnett Kanaris Ford,acting as a scribe for Buford Dresser, MD.,have documented all relevant documentation on the behalf of Buford Dresser, MD,as directed by  Buford Dresser, MD while in the presence of Buford Dresser, MD.   I, Buford Dresser, MD, have reviewed all documentation for this visit. The documentation on 05/28/22 for the exam, diagnosis, procedures, and orders are all accurate and complete.   Signed, Buford Dresser, MD PhD 05/28/2022 12:33 PM    [place]

## 2022-06-03 DIAGNOSIS — H5203 Hypermetropia, bilateral: Secondary | ICD-10-CM | POA: Diagnosis not present

## 2022-06-03 DIAGNOSIS — H401131 Primary open-angle glaucoma, bilateral, mild stage: Secondary | ICD-10-CM | POA: Diagnosis not present

## 2022-06-03 DIAGNOSIS — H2513 Age-related nuclear cataract, bilateral: Secondary | ICD-10-CM | POA: Diagnosis not present

## 2022-06-03 DIAGNOSIS — H524 Presbyopia: Secondary | ICD-10-CM | POA: Diagnosis not present

## 2022-06-09 ENCOUNTER — Telehealth (HOSPITAL_COMMUNITY): Payer: Self-pay | Admitting: Emergency Medicine

## 2022-06-09 DIAGNOSIS — R0789 Other chest pain: Secondary | ICD-10-CM | POA: Diagnosis not present

## 2022-06-09 NOTE — Telephone Encounter (Signed)
Attempted to call patient regarding upcoming cardiac CT appointment. °Left message on voicemail with name and callback number °[name] RN Navigator Cardiac Imaging °[place] [facility] °[phone] Office °[phone] Cell ° °

## 2022-06-09 NOTE — Telephone Encounter (Signed)
Reaching out to patient to offer assistance regarding upcoming cardiac imaging study; pt verbalizes understanding of appt date/time, parking situation and where to check in, pre-test NPO status and medications ordered, and verified current allergies; name and call back number provided for further questions should they arise Marchia Bond RN Navigator Cardiac Imaging [facility] [phone] office [phone] cell  Arrival [address] entrance '25mg'$  metoprolol tartrate  Denies iv issues Aware contrast/nitro

## 2022-06-10 ENCOUNTER — Ambulatory Visit (HOSPITAL_COMMUNITY)
Admission: RE | Admit: 2022-06-10 | Discharge: 2022-06-10 | Disposition: A | Discharge status: Home / Self Care | Payer: BC Managed Care – PPO | Source: Ambulatory Visit | Attending: Cardiology | Admitting: Cardiology

## 2022-06-10 DIAGNOSIS — R072 Precordial pain: Secondary | ICD-10-CM | POA: Insufficient documentation

## 2022-06-10 LAB — BASIC METABOLIC PANEL
BUN/Creatinine Ratio: 15 (ref 9–23)
BUN: 13 mg/dL (ref 6–24)
CO2: 23 mmol/L (ref 20–29)
Calcium: 9.8 mg/dL (ref 8.7–10.2)
Chloride: 103 mmol/L (ref 96–106)
Creatinine, Ser: 0.88 mg/dL (ref 0.57–1.00)
Glucose: 98 mg/dL (ref 70–99)
Potassium: 4 mmol/L (ref 3.5–5.2)
Sodium: 140 mmol/L (ref 134–144)
eGFR: 77 mL/min/{1.73_m2} (ref >59)

## 2022-06-10 MED ORDER — IOHEXOL 350 MG/ML SOLN
100.0000 mL | Freq: Once | INTRAVENOUS | Status: AC | PRN
  Administered 2022-06-10: 100 mL via INTRAVENOUS

## 2022-06-10 MED ORDER — METOPROLOL TARTRATE 5 MG/5ML IV SOLN
INTRAVENOUS | Status: AC
  Administered 2022-06-10: 10 mg via INTRAVENOUS
  Filled 2022-06-10: qty 10

## 2022-06-10 MED ORDER — NITROGLYCERIN 0.4 MG SL SUBL: 0.8000 mg | SUBLINGUAL_TABLET | Freq: Once | SUBLINGUAL | Status: AC

## 2022-06-10 MED ORDER — NITROGLYCERIN 0.4 MG SL SUBL
SUBLINGUAL_TABLET | SUBLINGUAL | Status: AC
  Administered 2022-06-10: 0.8 mg via SUBLINGUAL
  Filled 2022-06-10: qty 2

## 2022-06-10 MED ORDER — METOPROLOL TARTRATE 5 MG/5ML IV SOLN: 10.0000 mg | Freq: Once | INTRAVENOUS | Status: AC

## 2022-06-19 ENCOUNTER — Other Ambulatory Visit: Payer: Self-pay | Admitting: Nurse Practitioner

## 2022-06-19 DIAGNOSIS — E559 Vitamin D deficiency, unspecified: Secondary | ICD-10-CM

## 2022-07-14 ENCOUNTER — Ambulatory Visit (INDEPENDENT_AMBULATORY_CARE_PROVIDER_SITE_OTHER): Payer: BC Managed Care – PPO | Admitting: Nurse Practitioner

## 2022-07-14 VITALS — BP 128/82 | HR 86 | Temp 98.6°F | Resp 16 | Ht 67.0 in | Wt 271.8 lb

## 2022-07-14 DIAGNOSIS — I10 Essential (primary) hypertension: Secondary | ICD-10-CM | POA: Diagnosis not present

## 2022-07-14 DIAGNOSIS — E1165 Type 2 diabetes mellitus with hyperglycemia: Secondary | ICD-10-CM | POA: Diagnosis not present

## 2022-07-14 DIAGNOSIS — E559 Vitamin D deficiency, unspecified: Secondary | ICD-10-CM

## 2022-07-14 DIAGNOSIS — E785 Hyperlipidemia, unspecified: Secondary | ICD-10-CM

## 2022-07-14 DIAGNOSIS — K760 Fatty (change of) liver, not elsewhere classified: Secondary | ICD-10-CM

## 2022-07-14 DIAGNOSIS — E1169 Type 2 diabetes mellitus with other specified complication: Secondary | ICD-10-CM | POA: Diagnosis not present

## 2022-07-14 DIAGNOSIS — Z6841 Body Mass Index (BMI) 40.0 and over, adult: Secondary | ICD-10-CM

## 2022-07-14 LAB — HEMOGLOBIN A1C: Hgb A1c MFr Bld: 6.5 % (ref 4.6–6.5)

## 2022-07-14 LAB — MICROALBUMIN / CREATININE URINE RATIO
Creatinine,U: 79.2 mg/dL
Microalb Creat Ratio: 0.9 mg/g (ref 0.0–30.0)
Microalb, Ur: <0.7 mg/dL (ref 0.0–1.9)

## 2022-07-14 LAB — VITAMIN D 25 HYDROXY (VIT D DEFICIENCY, FRACTURES): VITD: 13.58 ng/mL — ABNORMAL LOW (ref 30.00–100.00)

## 2022-07-14 MED ORDER — MOUNJARO 2.5 MG/0.5ML ~~LOC~~ SOAJ: 2.5000 mg | SUBCUTANEOUS | 1 refills | Status: DC

## 2022-07-14 MED ORDER — VITAMIN D (ERGOCALCIFEROL) 1.25 MG (50000 UNIT) PO CAPS: 50000.0000 [IU] | ORAL_CAPSULE | ORAL | 0 refills | Status: DC

## 2022-07-14 NOTE — Patient Instructions (Addendum)
Go to lab Schedule appt with podiatry  Diabetes Mellitus and Nutrition, Adult When you have diabetes, or diabetes mellitus, it is very important to have healthy eating habits because your blood sugar (glucose) levels are greatly affected by what you eat and drink. Eating healthy foods in the right amounts, at about the same times every day, can help you: Manage your blood glucose. Lower your risk of heart disease. Improve your blood pressure. Reach or maintain a healthy weight. What can affect my meal plan? Every person with diabetes is different, and each person has different needs for a meal plan. Your health care provider may recommend that you work with a dietitian to make a meal plan that is best for you. Your meal plan may vary depending on factors such as: The calories you need. The medicines you take. Your weight. Your blood glucose, blood pressure, and cholesterol levels. Your activity level. Other health conditions you have, such as heart or kidney disease. How do carbohydrates affect me? Carbohydrates, also called carbs, affect your blood glucose level more than any other type of food. Eating carbs raises the amount of glucose in your blood. It is important to know how many carbs you can safely have in each meal. This is different for every person. Your dietitian can help you calculate how many carbs you should have at each meal and for each snack. How does alcohol affect me? Alcohol can cause a decrease in blood glucose (hypoglycemia), especially if you use insulin or take certain diabetes medicines by mouth. Hypoglycemia can be a life-threatening condition. Symptoms of hypoglycemia, such as sleepiness, dizziness, and confusion, are similar to symptoms of having too much alcohol. Do not drink alcohol if: Your health care provider tells you not to drink. You are pregnant, may be pregnant, or are planning to become pregnant. If you drink alcohol: Limit how much you have to: 0-1  drink a day for women. 0-2 drinks a day for men. Know how much alcohol is in your drink. In the [place] one drink equals one 12 oz bottle of beer (355 mL), one 5 oz glass of wine (148 mL), or one 1 oz glass of hard liquor (44 mL). Keep yourself hydrated with water, diet soda, or unsweetened iced tea. Keep in mind that regular soda, juice, and other mixers may contain a lot of sugar and must be counted as carbs. What are tips for following this plan?  Reading food labels Start by checking the serving size on the Nutrition Facts label of packaged foods and drinks. The number of calories and the amount of carbs, fats, and other nutrients listed on the label are based on one serving of the item. Many items contain more than one serving per package. Check the total grams (g) of carbs in one serving. Check the number of grams of saturated fats and trans fats in one serving. Choose foods that have a low amount or none of these fats. Check the number of milligrams (mg) of salt (sodium) in one serving. Most people should limit total sodium intake to less than 2,300 mg per day. Always check the nutrition information of foods labeled as "low-fat" or "nonfat." These foods may be higher in added sugar or refined carbs and should be avoided. Talk to your dietitian to identify your daily goals for nutrients listed on the label. Shopping Avoid buying canned, pre-made, or processed foods. These foods tend to be high in fat, sodium, and added sugar. Shop around the outside edge  of the grocery store. This is where you will most often find fresh fruits and vegetables, bulk grains, fresh meats, and fresh dairy products. Cooking Use low-heat cooking methods, such as baking, instead of high-heat cooking methods, such as deep frying. Cook using healthy oils, such as olive, canola, or sunflower oil. Avoid cooking with butter, cream, or high-fat meats. Meal planning Eat meals and snacks regularly, preferably at the same  times every day. Avoid going long periods of time without eating. Eat foods that are high in fiber, such as fresh fruits, vegetables, beans, and whole grains. Eat 4-6 oz (112-168 g) of lean protein each day, such as lean meat, chicken, fish, eggs, or tofu. One ounce (oz) (28 g) of lean protein is equal to: 1 oz (28 g) of meat, chicken, or fish. 1 egg.  cup (62 g) of tofu. Eat some foods each day that contain healthy fats, such as avocado, nuts, seeds, and fish. What foods should I eat? Fruits Berries. Apples. Oranges. Peaches. Apricots. Plums. Grapes. Mangoes. Papayas. Pomegranates. Kiwi. Cherries. Vegetables Leafy greens, including lettuce, spinach, kale, chard, collard greens, mustard greens, and cabbage. Beets. Cauliflower. Broccoli. Carrots. Green beans. Tomatoes. Peppers. Onions. Cucumbers. Brussels sprouts. Grains Whole grains, such as whole-wheat or whole-grain bread, crackers, tortillas, cereal, and pasta. Unsweetened oatmeal. Quinoa. Brown or wild rice. Meats and other proteins Seafood. Poultry without skin. Lean cuts of poultry and beef. Tofu. Nuts. Seeds. Dairy Low-fat or fat-free dairy products such as milk, yogurt, and cheese. The items listed above may not be a complete list of foods and beverages you can eat and drink. Contact a dietitian for more information. What foods should I avoid? Fruits Fruits canned with syrup. Vegetables Canned vegetables. Frozen vegetables with butter or cream sauce. Grains Refined white flour and flour products such as bread, pasta, snack foods, and cereals. Avoid all processed foods. Meats and other proteins Fatty cuts of meat. Poultry with skin. Breaded or fried meats. Processed meat. Avoid saturated fats. Dairy Full-fat yogurt, cheese, or milk. Beverages Sweetened drinks, such as soda or iced tea. The items listed above may not be a complete list of foods and beverages you should avoid. Contact a dietitian for more information. Questions  to ask a health care provider Do I need to meet with a certified diabetes care and education specialist? Do I need to meet with a dietitian? What number can I call if I have questions? When are the best times to check my blood glucose? Where to find more information: American Diabetes Association: diabetes.org Academy of Nutrition and Dietetics: eatright.[employer] of Diabetes and Digestive and Kidney Diseases: StageSync.si Association of Diabetes Care & Education Specialists: diabeteseducator.org Summary It is important to have healthy eating habits because your blood sugar (glucose) levels are greatly affected by what you eat and drink. It is important to use alcohol carefully. A healthy meal plan will help you manage your blood glucose and lower your risk of heart disease. Your health care provider may recommend that you work with a dietitian to make a meal plan that is best for you. This information is not intended to replace advice given to you by your health care provider. Make sure you discuss any questions you have with your health care provider. Document Revised: 10/26/2019 Document Reviewed: 10/26/2019 Elsevier Patient Education  2023 [employer].

## 2022-07-14 NOTE — Addendum Note (Signed)
Addended by: Alysia Penna L on: 07/14/2022 03:40 PM   Modules accepted: Orders

## 2022-07-14 NOTE — Assessment & Plan Note (Signed)
>>  ASSESSMENT AND PLAN FOR SEVERE OBESITY (BMI >= 40) (HCC) WRITTEN ON 07/14/2022  8:46 AM BY Alcario Tinkey LUM, NP  Stop saxenda since no weight loss noted. Hx of Fatty liver noted per CT, HTN and DM Wt Readings from Last 3 Encounters:  07/14/22 271 lb 12.8 oz (123.3 kg)  05/27/22 265 lb 9.6 oz (120.5 kg)  05/04/22 270 lb (122.5 kg)    Advised about importance of lifestyle modification Start mounjaro with new diagnosis of DM-type 2

## 2022-07-14 NOTE — Assessment & Plan Note (Addendum)
Stop saxenda since no weight loss noted. Hx of Fatty liver noted per CT, HTN and DM Wt Readings from Last 3 Encounters:  07/14/22 271 lb 12.8 oz (123.3 kg)  05/27/22 265 lb 9.6 oz (120.5 kg)  05/04/22 270 lb (122.5 kg)    Advised about importance of lifestyle modification Start mounjaro with new diagnosis of DM-type 2

## 2022-07-14 NOTE — Assessment & Plan Note (Signed)
LDL at goal CT coronary calcium score on 06/10/22: 0 score, fatty liver present No need for statin at this time. Advised about the need for weight loss through heart healthy diet and exercise

## 2022-07-14 NOTE — Assessment & Plan Note (Signed)
BP at goal with benicar/hctz and metoprolol BP Readings from Last 3 Encounters:  07/14/22 128/82  06/10/22 128/84  05/27/22 128/82    Maintain med doses

## 2022-07-14 NOTE — Progress Notes (Signed)
Established Patient Visit  Patient: Kimberly Underwood   DOB: 03-19-1966   57 y.o. Female  MRN: [id number] Visit Date: 07/14/2022  Subjective:    Chief Complaint  Patient presents with   Diabetes   Hyperlipidemia associated with type 2 diabetes mellitus LDL at goal CT coronary calcium score on 06/10/22: 0 score, fatty liver present No need for statin at this time. Advised about the need for weight loss through heart healthy diet and exercise   DM (diabetes mellitus) (HCC) New diagnosis. We discussed effects of diabetes disease process, long term complications and possible management with lifestyle modification and medications. She declined need for nutrition referral at this time. Has completed annual eye exam by [facility] in [place], Hx of glaucoma LDL at goal BP at goal Normal foot exam but hypertrophic nails. Advised to schedule appt with podiatry. Start mounjaro 2.5mg  weekly check hgb A1c and UACr today F/up in 47month  Hypertensive disorder BP at goal with benicar/hctz and metoprolol BP Readings from Last 3 Encounters:  07/14/22 128/82  06/10/22 128/84  05/27/22 128/82    Maintain med doses  Severe obesity (BMI >= 40) (HCC) Stop saxenda since no weight loss noted. Hx of Fatty liver noted per CT, HTN and DM Wt Readings from Last 3 Encounters:  07/14/22 271 lb 12.8 oz (123.3 kg)  05/27/22 265 lb 9.6 oz (120.5 kg)  05/04/22 270 lb (122.5 kg)    Advised about importance of lifestyle modification Start mounjaro with new diagnosis of DM-type 2   Wt Readings from Last 3 Encounters:  07/14/22 271 lb 12.8 oz (123.3 kg)  05/27/22 265 lb 9.6 oz (120.5 kg)  05/04/22 270 lb (122.5 kg)    Reviewed medical, surgical, and social history today  Medications: Outpatient Medications Prior to Visit  Medication Sig   bimatoprost (LUMIGAN) 0.01 % SOLN Place 1 drop into both eyes at bedtime.   famotidine (PEPCID) 20 MG tablet Take 1 tablet (20 mg total) by  mouth 2 (two) times daily. For itching and rash.   loratadine (CLARITIN) 10 MG tablet Take 1 tablet (10 mg total) by mouth daily. For itching and rash.   metoprolol tartrate (LOPRESSOR) 25 MG tablet Take 1 tablet (25 mg total) by mouth once for 1 dose.   olmesartan-hydrochlorothiazide (BENICAR HCT) 40-25 MG tablet Take 1 tablet by mouth daily.   oxybutynin (DITROPAN) 5 MG tablet Take 5 mg by mouth 2 (two) times daily.   timolol (TIMOPTIC) 0.5 % ophthalmic solution SMARTSIG:In Eye(s)   Vitamin D, Ergocalciferol, (DRISDOL) 1.25 MG (50000 UNIT) CAPS capsule TAKE 1 CAPSULE (50,000 UNITS TOTAL) BY MOUTH EVERY 7 (SEVEN) DAYS   [DISCONTINUED] Liraglutide -Weight Management (SAXENDA) 18 MG/3ML SOPN Inject 3 mg into the skin daily.   No facility-administered medications prior to visit.   Reviewed past medical and social history.   ROS per HPI above      Objective:  BP 128/82 (BP Location: Right Arm, Patient Position: Sitting, Cuff Size: Large)   Pulse 86   Temp 98.6 F (37 C) (Temporal)   Resp 16   Ht 5\' 7"  (1.702 m)   Wt 271 lb 12.8 oz (123.3 kg)   SpO2 95%   BMI 42.57 kg/m      Physical Exam Cardiovascular:     Rate and Rhythm: Normal rate and regular rhythm.     Pulses: Normal pulses.     Heart sounds: Normal  heart sounds.  Pulmonary:     Effort: Pulmonary effort is normal.     Breath sounds: Normal breath sounds.  Musculoskeletal:     Right lower leg: No edema.     Left lower leg: No edema.  Neurological:     Mental Status: She is alert and oriented to person, place, and time.     No results found for any visits on 07/14/22.    Assessment & Plan:    Problem List Items Addressed This Visit       Cardiovascular and Mediastinum   Hypertensive disorder    BP at goal with benicar/hctz and metoprolol BP Readings from Last 3 Encounters:  07/14/22 128/82  06/10/22 128/84  05/27/22 128/82    Maintain med doses        Digestive   Nonalcoholic fatty liver      Endocrine   DM (diabetes mellitus) - Primary    New diagnosis. We discussed effects of diabetes disease process, long term complications and possible management with lifestyle modification and medications. She declined need for nutrition referral at this time. Has completed annual eye exam by [facility] in [place], Hx of glaucoma LDL at goal BP at goal Normal foot exam but hypertrophic nails. Advised to schedule appt with podiatry. Start mounjaro 2.5mg  weekly check hgb A1c and UACr today F/up in 43month      Relevant Medications   tirzepatide [facility]) 2.5 MG/0.5ML Pen   Other Relevant Orders   Hemoglobin A1c   Microalbumin / creatinine urine ratio   Hyperlipidemia associated with type 2 diabetes mellitus    LDL at goal CT coronary calcium score on 06/10/22: 0 score, fatty liver present No need for statin at this time. Advised about the need for weight loss through heart healthy diet and exercise       Relevant Medications   tirzepatide (MOUNJARO) 2.5 MG/0.5ML Pen     Other   Severe obesity (BMI >= 40)    Stop saxenda since no weight loss noted. Hx of Fatty liver noted per CT, HTN and DM Wt Readings from Last 3 Encounters:  07/14/22 271 lb 12.8 oz (123.3 kg)  05/27/22 265 lb 9.6 oz (120.5 kg)  05/04/22 270 lb (122.5 kg)    Advised about importance of lifestyle modification Start mounjaro with new diagnosis of DM-type 2       Relevant Medications   tirzepatide (MOUNJARO) 2.5 MG/0.5ML Pen   Other Visit Diagnoses     Vitamin D deficiency       Relevant Orders   VITAMIN D 25 Hydroxy (Vit-D Deficiency, Fractures)      Return in about 3 months (around 10/13/2022) for HTN, DM, hyperlipidemia (fasting).     Alysia Penna, NP

## 2022-07-14 NOTE — Assessment & Plan Note (Addendum)
New diagnosis. We discussed effects of diabetes disease process, long term complications and possible management with lifestyle modification and medications. She declined need for nutrition referral at this time. Has completed annual eye exam by [facility] in [place], Hx of glaucoma LDL at goal BP at goal Normal foot exam but hypertrophic nails. Advised to schedule appt with podiatry. Start mounjaro 2.5mg  weekly check hgb A1c and UACr today: normal UACr, improved hgbA1c 6.6 to 6.5% No medication at this time F/up in 33month

## 2022-07-18 DIAGNOSIS — H401131 Primary open-angle glaucoma, bilateral, mild stage: Secondary | ICD-10-CM | POA: Diagnosis not present

## 2022-07-18 DIAGNOSIS — H2513 Age-related nuclear cataract, bilateral: Secondary | ICD-10-CM | POA: Diagnosis not present

## 2022-07-22 ENCOUNTER — Ambulatory Visit (HOSPITAL_BASED_OUTPATIENT_CLINIC_OR_DEPARTMENT_OTHER): Payer: BC Managed Care – PPO | Admitting: Cardiology

## 2022-08-04 ENCOUNTER — Ambulatory Visit: Payer: BC Managed Care – PPO | Admitting: Internal Medicine

## 2022-08-04 ENCOUNTER — Ambulatory Visit: Payer: BC Managed Care – PPO

## 2022-08-07 ENCOUNTER — Encounter (HOSPITAL_BASED_OUTPATIENT_CLINIC_OR_DEPARTMENT_OTHER): Payer: Self-pay | Admitting: Cardiology

## 2022-08-07 ENCOUNTER — Ambulatory Visit (INDEPENDENT_AMBULATORY_CARE_PROVIDER_SITE_OTHER): Payer: BC Managed Care – PPO | Admitting: Cardiology

## 2022-08-07 VITALS — BP 106/69 | HR 92 | Ht 67.0 in | Wt 269.0 lb

## 2022-08-07 DIAGNOSIS — I10 Essential (primary) hypertension: Secondary | ICD-10-CM

## 2022-08-07 DIAGNOSIS — E119 Type 2 diabetes mellitus without complications: Secondary | ICD-10-CM | POA: Diagnosis not present

## 2022-08-07 DIAGNOSIS — K760 Fatty (change of) liver, not elsewhere classified: Secondary | ICD-10-CM | POA: Diagnosis not present

## 2022-08-07 DIAGNOSIS — Z712 Person consulting for explanation of examination or test findings: Secondary | ICD-10-CM | POA: Diagnosis not present

## 2022-08-07 DIAGNOSIS — Z7189 Other specified counseling: Secondary | ICD-10-CM

## 2022-08-07 DIAGNOSIS — Z7985 Long-term (current) use of injectable non-insulin antidiabetic drugs: Secondary | ICD-10-CM

## 2022-08-07 MED ORDER — ROSUVASTATIN CALCIUM 10 MG PO TABS
10.0000 mg | ORAL_TABLET | Freq: Every day | ORAL | 3 refills | Status: DC
Start: 2022-08-07 — End: 2023-07-06

## 2022-08-07 NOTE — Patient Instructions (Signed)
Medication Instructions:  Your physician has recommended you make the following change in your medication:   Consider taking a daily baby aspirin (81 mg)--not full dose 325 mg aspirin.  Start: Rosuvastatin 10mg  daily   *If you need a refill on your cardiac medications before your next appointment, please call your pharmacy*   Lab Work: Please return for Lab work in 3 months for Fasting Lipid Panel and Liver Function Tests . You may come to the...   [facility] (3rd floor) [address] [place], [place] [address]  Open: 8am-Noon and 1pm-4:30pm  Please ring the doorbell on the small table when you exit the elevator and the Lab Tech will come get you  [facility] at [facility] [address], [place], [place] [address] Open: 8am-1pm, then 2pm-4:30pm   [facility]- Please see attached locations sheet stapled to your lab work with address and hours.   If you have labs (blood work) drawn today and your tests are completely normal, you will receive your results only by: MyChart Message (if you have MyChart) OR A paper copy in the mail If you have any lab test that is abnormal or we need to change your treatment, we will call you to review the results.  Follow-Up: At [facility], you and your health needs are our priority.  As part of our continuing mission to provide you with exceptional heart care, we have created designated Provider Care Teams.  These Care Teams include your primary Cardiologist (physician) and Advanced Practice Providers (APPs -  Physician Assistants and Nurse Practitioners) who all work together to provide you with the care you need, when you need it.  We recommend signing up for the patient portal called "MyChart".  Sign up information is provided on this After Visit Summary.  MyChart is used to connect with patients for Virtual Visits (Telemedicine).  Patients are able to view lab/test results, encounter notes,  upcoming appointments, etc.  Non-urgent messages can be sent to your provider as well.   To learn more about what you can do with MyChart, go to [email or web address].    Your next appointment:   1 year(s)  Provider:   Jodelle Red, MD

## 2022-08-07 NOTE — Progress Notes (Signed)
Cardiology Office Note:    Date:  08/07/2022   ID:  Kimberly Underwood, DOB 08/15/1965, MRN [id number]  PCP:  Anne Ng, NP  Cardiologist:  Jodelle Red, MD  Referring MD: Anne Ng, NP   CC: Follow-up to discuss coronary CT results  History of Present Illness:    Kimberly Underwood is a 57 y.o. female with a hx of gout, hyperlipidemia, diabetes, and hypertension who is seen for follow-up. She was initially seen 05/27/2022 as a new consult at the request of Nche, Bonna Gains, NP for the evaluation and management of episodes of chest pain.  She was seen in the ED for chest pain with numbness of the L arm. EKG was normal. Troponin was negative. She described the chest pain as starting in the chest initially then progressed with numbness to her L arm, along with tingling sensation and some shortness of breath. The pain felt like a constant squeezing for a couple days.  Cardiovascular risk factors: Prior clinical ASCVD: none Comorbid conditions: hypertension, hyperlipidemia, diabetes Metabolic syndrome/Obesity: obesity  Chronic inflammatory conditions: Tobacco use history: never Family history: Mother had CAD, Father had skipped beats  Prior pertinent testing and/or incidental findings: stress test and Echo in October (normal) Exercise level: She does not exercise Current diet: eats too much and eats the wrong things.  At her last appointment, we agreed to pursue CT coronary angiography based on shared decision making. Her coronary CTA 06/2022 revealed a coronary calcium score of 0 with no evidence of CAD.  Today, she states she is feeling pretty good. We reviewed her CT results in detail.  Additionally she complains of some tremors.  She is slowly but surely working on increasing her exercise. Currently up to 10 minutes on the exercise bike. No anginal symptoms.  She denies any palpitations, chest pain, shortness of breath, or peripheral edema. No  lightheadedness, headaches, syncope, orthopnea, or PND.   Past Medical History:  Diagnosis Date   Anxiety    Arthritis 2014   CARPAL TUNNEL SYNDROME, BILATERAL 05/22/2010   Dental caries    Dyspnea on exertion 12/24/2021   Gout    History of bronchitis 10/25/2015   History of CT scan of head 07/2014   due to syncope, normal scan   History of echocardiogram 07/17/2014   TTE, normal LV function, 55-60% EF   History of EKG 07/2014   PVCs, otherwise normal   History of nuclear stress test 07/2014   normal   HYPERTENSION 10/25/2008   Impaired fasting blood sugar    Normal cardiac stress test 07/17/2014   Dr. Peter [place]   Overactive bladder    Right lateral epicondylitis 03/09/2017   Vitamin D deficiency    Wears glasses     Past Surgical History:  Procedure Laterality Date   ABDOMINAL HYSTERECTOMY  09/2010   fibroid, partial hysterectomy    TOENAIL EXCISION Bilateral 2000's   "big toes" (12/23/2012)   WISDOM TOOTH EXTRACTION  ~ 1985    Current Medications: Current Outpatient Medications on File Prior to Visit  Medication Sig   bimatoprost (LUMIGAN) 0.01 % SOLN Place 1 drop into both eyes at bedtime.   brimonidine (ALPHAGAN) 0.2 % ophthalmic solution SMARTSIG:In Eye(s)   famotidine (PEPCID) 20 MG tablet Take 1 tablet (20 mg total) by mouth 2 (two) times daily. For itching and rash.   loratadine (CLARITIN) 10 MG tablet Take 1 tablet (10 mg total) by mouth daily. For itching and rash.   olmesartan-hydrochlorothiazide (BENICAR  HCT) 40-25 MG tablet Take 1 tablet by mouth daily.   oxybutynin (DITROPAN) 5 MG tablet Take 5 mg by mouth 2 (two) times daily.   timolol (TIMOPTIC) 0.5 % ophthalmic solution SMARTSIG:In Eye(s)   tirzepatide (MOUNJARO) 2.5 MG/0.5ML Pen Inject 2.5 mg into the skin once a week.   Vitamin D, Ergocalciferol, (DRISDOL) 1.25 MG (50000 UNIT) CAPS capsule Take 1 capsule (50,000 Units total) by mouth every 7 (seven) days.   No current facility-administered  medications on file prior to visit.     Allergies:   Patient has no known allergies.   Social History   Tobacco Use   Smoking status: Never   Smokeless tobacco: Never  Vaping Use   Vaping Use: Never used  Substance Use Topics   Alcohol use: Not Currently   Drug use: No    Family History: family history includes Arthritis in her father; Cancer in her maternal grandfather and mother; Clotting disorder in her father; Gout in her father; Heart disease in her daughter and father; Hypertension in her father and sister; Lung disease in her father; Mental illness in her sister; Migraines in her sister; Sleep apnea in her sister; Stroke in her father and maternal grandmother. There is no history of Diabetes.  ROS:   Please see the history of present illness. (+) Tremors All other systems are reviewed and negative.   EKGs/Labs/Other Studies Reviewed:    The following studies were reviewed today:  Coronary CTA  06/10/2022: IMPRESSION: 1. Coronary calcium score of 0.   2. Normal coronary origin with right dominance.   3. No evidence of CAD.   CAD-RADS 0. No evidence of CAD (0%). Consider non-atherosclerotic causes of chest pain.  PCV ECHO 01/24/2022: Normal LV systolic function with visual EF 65-70%. Left ventricle cavity  is normal in size. Mild concentric hypertrophy of the left ventricle.  Normal global wall motion. Normal diastolic filling pattern, normal LAP.  Structurally normal tricuspid valve with trace regurgitation. No evidence  of pulmonary hypertension.  No prior available for comparison.   Stress Test 01/20/2022: Exercise nuclear stress test was performed using Bruce protocol.   1 Day Rest and Stress images. Exercise time 02seconds on Bruce protocol, achieved 4.66 METS, 86% of age-predicted maximum heart rate (APMHR).  Stress ECG negative for ischemia.   Normal myocardial perfusion without convincing evidence of reversible myocardial ischemia or prior  infarct. Calculated LVEF 60%. Left ventricular size and wall motion preserved. Low risk study.  EKG:  EKG is personally reviewed.   08/07/2022:  EKG was not ordered. 05/27/2022: NSR at 78 bpm  Recent Labs: 12/21/2021: Magnesium 2.1 05/04/2022: Hemoglobin 12.5; Platelets 369 06/09/2022: BUN 13; Creatinine, Ser 0.88; Potassium 4.0; Sodium 140   Recent Lipid Panel    Component Value Date/Time   CHOL 136 04/14/2022 0821   TRIG 115.0 04/14/2022 0821   HDL 48.60 04/14/2022 0821   CHOLHDL 3 04/14/2022 0821   VLDL 23.0 04/14/2022 0821   LDLCALC 64 04/14/2022 0821    Physical Exam:    VS:  BP 106/69   Pulse 92   Ht 5\' 7"  (1.702 m)   Wt 269 lb (122 kg)   SpO2 94%   BMI 42.13 kg/m     Wt Readings from Last 3 Encounters:  08/07/22 269 lb (122 kg)  07/14/22 271 lb 12.8 oz (123.3 kg)  05/27/22 265 lb 9.6 oz (120.5 kg)    GEN: Well nourished, well developed in no acute distress HEENT: Normal, moist mucous membranes  NECK: No JVD CARDIAC: regular rhythm, normal S1 and S2, no rubs or gallops. No murmur. VASCULAR: Radial and DP pulses 2+ bilaterally. No carotid bruits RESPIRATORY:  Clear to auscultation without rales, wheezing or rhonchi  ABDOMEN: Soft, non-tender, non-distended MUSCULOSKELETAL:  Ambulates independently SKIN: Warm and dry, no edema NEUROLOGIC:  Alert and oriented x 3. No focal neuro deficits noted. PSYCHIATRIC:  Normal affect    ASSESSMENT:    1. Type 2 diabetes mellitus without complication, without long-term current use of insulin (HCC)   2. Primary hypertension   3. Nonalcoholic fatty liver   4. Encounter to discuss test results   5. Cardiac risk counseling     PLAN:    Chest pain -reviewed coronary CT at length. Ca score 0, no CAD. Reassuring, chest pain unlikely to be cardiac -reviewed red flag warning signs that need immediate medical attention   Hypertension -continue olmesartan-HCTZ  Type II diabetes -on liraglutide -not on statin. We reviewed  that her CT is reassuring, but guidelines do recommend statin for long term prevention. She is amenable -discussed aspirin as well, she will consider -last A1c 6.6  Fatty liver: -seen on CT. Starting statin, reviewed lifestyle  Cardiac risk counseling and prevention recommendations: -recommend heart healthy/Mediterranean diet, with whole grains, fruits, vegetable, fish, lean meats, nuts, and olive oil. Limit salt. -recommend moderate walking, 3-5 times/week for 30-50 minutes each session. Aim for at least 150 minutes.week. Goal should be pace of 3 miles/hours, or walking 1.5 miles in 30 minutes -recommend avoidance of tobacco products. Avoid excess alcohol. -ASCVD risk score: The 10-year ASCVD risk score (Arnett DK, et al., 2019) is: 5.6%   Values used to calculate the score:     Age: 23 years     Sex: Female     Is Non-Hispanic African American: Yes     Diabetic: Yes     Tobacco smoker: No     Systolic Blood Pressure: 106 mmHg     Is BP treated: Yes     HDL Cholesterol: 48.6 mg/dL     Total Cholesterol: 136 mg/dL    Plan for follow up: 1 year, or sooner as needed.  Jodelle Red, MD, PhD, [facility]  [facility]  [place]  [facility] at [facility] [address], [address] [place], [place] [address] [phone]   Medication Adjustments/Labs and Tests Ordered: Current medicines are reviewed at length with the patient today.  Concerns regarding medicines are outlined above.   No orders of the defined types were placed in this encounter.  Meds ordered this encounter  Medications   rosuvastatin (CRESTOR) 10 MG tablet    Sig: Take 1 tablet (10 mg total) by mouth daily.    Dispense:  90 tablet    Refill:  3   Patient Instructions  Consider taking a daily baby aspirin (81 mg)--not full dose 325 mg aspirin.   I,Mathew Stumpf,acting as a Neurosurgeon for [employer], MD.,have documented all relevant  documentation on the behalf of Jodelle Red, MD,as directed by  Jodelle Red, MD while in the presence of Jodelle Red, MD.  I, Jodelle Red, MD, have reviewed all documentation for this visit. The documentation on 08/07/22 for the exam, diagnosis, procedures, and orders are all accurate and complete.   Signed, Jodelle Red, MD PhD 08/07/2022     [facility]

## 2022-08-13 ENCOUNTER — Ambulatory Visit: Payer: BC Managed Care – PPO | Admitting: Internal Medicine

## 2022-08-13 ENCOUNTER — Encounter: Payer: Self-pay | Admitting: Internal Medicine

## 2022-08-13 VITALS — BP 129/71 | HR 92 | Ht 67.0 in | Wt 269.0 lb

## 2022-08-13 DIAGNOSIS — I10 Essential (primary) hypertension: Secondary | ICD-10-CM | POA: Diagnosis not present

## 2022-08-13 DIAGNOSIS — E782 Mixed hyperlipidemia: Secondary | ICD-10-CM | POA: Diagnosis not present

## 2022-08-13 NOTE — Progress Notes (Signed)
Primary Physician/Referring:  Anne Ng, NP  Patient ID: Kimberly Underwood, female    DOB: 16-Mar-1966, 57 y.o.   MRN: [id number]  Chief Complaint  Patient presents with   Chest Pain   Follow-up   HPI:    KHRISTINA NILGES  is a 57 y.o. AA female with history of hypertension, anxiety.  At last visit she was seen for evaluation of chest pain and shortness of breath that had been ongoing for 1 year along with elevated blood pressure. She underwent echocardiogram that revealed LVEF 65-70% and exercise nuclear stress test that was a low risk study. Benicar HCT was increased for better blood pressure control.   She presents today for follow-up.  Overall, she is feeling well without any complaints.  She is working on losing weight. She has started using her exercise bike and is trying to work up her stamina. Her blood pressure is very well controlled both here and at home. Patient says she is feeling great. She denies chest pain, shortness of breath, palpitations, leg edema, orthopnea, PND, TIA/syncope.   Past Medical History:  Diagnosis Date   Anxiety    Arthritis 2014   CARPAL TUNNEL SYNDROME, BILATERAL 05/22/2010   Dental caries    Dyspnea on exertion 12/24/2021   Gout    History of bronchitis 10/25/2015   History of CT scan of head 07/2014   due to syncope, normal scan   History of echocardiogram 07/17/2014   TTE, normal LV function, 55-60% EF   History of EKG 07/2014   PVCs, otherwise normal   History of nuclear stress test 07/2014   normal   HYPERTENSION 10/25/2008   Impaired fasting blood sugar    Normal cardiac stress test 07/17/2014   Dr. Peter [place]   Overactive bladder    Right lateral epicondylitis 03/09/2017   Vitamin D deficiency    Wears glasses    Past Surgical History:  Procedure Laterality Date   ABDOMINAL HYSTERECTOMY  09/2010   fibroid, partial hysterectomy    TOENAIL EXCISION Bilateral 2000's   "big toes" (12/23/2012)   WISDOM TOOTH EXTRACTION  ~  1985   Family History  Problem Relation Age of Onset   Cancer Mother        breast cancer   Hypertension Father    Gout Father    Clotting disorder Father    Lung disease Father        listeria   Stroke Father    Arthritis Father    Heart disease Father    Migraines Sister    Hypertension Sister    Mental illness Sister    Sleep apnea Sister    Stroke Maternal Grandmother    Cancer Maternal Grandfather        lung cancer (smoking)   Heart disease Daughter        heart murmur   Diabetes Neg Hx     Social History   Tobacco Use   Smoking status: Never   Smokeless tobacco: Never  Substance Use Topics   Alcohol use: Not Currently   Marital Status: Single  ROS  Review of Systems  Cardiovascular:  Negative for chest pain, claudication, dyspnea on exertion, leg swelling, near-syncope, orthopnea, palpitations and syncope.   Objective  Blood pressure 129/71, pulse 92, height 5\' 7"  (1.702 m), weight 269 lb (122 kg), SpO2 95 %. Body mass index is 42.13 kg/m.     08/13/2022    2:26 PM 08/07/2022    3:36 PM  07/14/2022    7:59 AM  Vitals with BMI  Height 5\' 7"  5\' 7"  5\' 7"   Weight 269 lbs 269 lbs 271 lbs 13 oz  BMI 42.12 42.12 42.56  Systolic 129 106 161  Diastolic 71 69 82  Pulse 92 92 86     Physical Exam Constitutional:      Appearance: Normal appearance.  Cardiovascular:     Rate and Rhythm: Normal rate and regular rhythm.     Pulses:          Carotid pulses are 2+ on the right side and 2+ on the left side.      Radial pulses are 2+ on the right side and 2+ on the left side.       Popliteal pulses are 1+ on the right side and 1+ on the left side.       Dorsalis pedis pulses are 1+ on the right side and 1+ on the left side.       Posterior tibial pulses are 1+ on the right side.     Heart sounds: Normal heart sounds. No murmur heard. Pulmonary:     Effort: Pulmonary effort is normal.     Breath sounds: Normal breath sounds.  Chest:     Chest wall: No tenderness.   Abdominal:     General: Bowel sounds are normal.     Palpations: Abdomen is soft.  Musculoskeletal:     Right lower leg: No edema.     Left lower leg: No edema.  Neurological:     Mental Status: She is alert.    Medications and allergies  No Known Allergies   Medication list after today's encounter   Current Outpatient Medications:    bimatoprost (LUMIGAN) 0.01 % SOLN, Place 1 drop into both eyes at bedtime., Disp: , Rfl:    brimonidine (ALPHAGAN) 0.2 % ophthalmic solution, SMARTSIG:In Eye(s), Disp: , Rfl:    famotidine (PEPCID) 20 MG tablet, Take 1 tablet (20 mg total) by mouth 2 (two) times daily. For itching and rash., Disp: 30 tablet, Rfl: 0   olmesartan-hydrochlorothiazide (BENICAR HCT) 40-25 MG tablet, Take 1 tablet by mouth daily., Disp: 90 tablet, Rfl: 2   oxybutynin (DITROPAN) 5 MG tablet, Take 5 mg by mouth 2 (two) times daily., Disp: , Rfl:    rosuvastatin (CRESTOR) 10 MG tablet, Take 1 tablet (10 mg total) by mouth daily., Disp: 90 tablet, Rfl: 3   timolol (TIMOPTIC) 0.5 % ophthalmic solution, SMARTSIG:In Eye(s), Disp: , Rfl:    tirzepatide (MOUNJARO) 2.5 MG/0.5ML Pen, Inject 2.5 mg into the skin once a week., Disp: 6 mL, Rfl: 1   Vitamin D, Ergocalciferol, (DRISDOL) 1.25 MG (50000 UNIT) CAPS capsule, Take 1 capsule (50,000 Units total) by mouth every 7 (seven) days., Disp: 12 capsule, Rfl: 0   loratadine (CLARITIN) 10 MG tablet, Take 1 tablet (10 mg total) by mouth daily. For itching and rash. (Patient not taking: Reported on 08/13/2022), Disp: 15 tablet, Rfl: 0  Laboratory examination:   Lab Results  Component Value Date   NA 140 06/09/2022   K 4.0 06/09/2022   CO2 23 06/09/2022   GLUCOSE 98 06/09/2022   BUN 13 06/09/2022   CREATININE 0.88 06/09/2022   CALCIUM 9.8 06/09/2022   EGFR 77 06/09/2022   GFRNONAA >60 05/04/2022       Latest Ref Rng & Units 06/09/2022    3:40 PM 05/04/2022    4:03 PM 04/14/2022    8:21 AM  CMP  Glucose  70 - 99 mg/dL 98  161  97   BUN  6 - 24 mg/dL 13  14  13    Creatinine 0.57 - 1.00 mg/dL 0.96  0.45  4.09   Sodium 134 - 144 mmol/L 140  138  142   Potassium 3.5 - 5.2 mmol/L 4.0  3.4  4.0   Chloride 96 - 106 mmol/L 103  101  105   CO2 20 - 29 mmol/L 23  30  29    Calcium 8.7 - 10.2 mg/dL 9.8  9.6  9.7       Latest Ref Rng & Units 05/04/2022    4:03 PM 12/21/2021    1:14 PM 09/02/2019    8:04 AM  CBC  WBC 4.0 - 10.5 K/uL 8.0  7.4  5.1   Hemoglobin 12.0 - 15.0 g/dL 81.1  91.4  78.2   Hematocrit 36.0 - 46.0 % 37.6  41.6  38.9   Platelets 150 - 400 K/uL 369  345  326.0     Lipid Panel Recent Labs    09/30/21 0845 04/14/22 0821  CHOL 127 136  TRIG 177.0* 115.0  LDLCALC 43 64  VLDL 35.4 23.0  HDL 48.50 48.60  CHOLHDL 3 3    HEMOGLOBIN A1C Lab Results  Component Value Date   HGBA1C 6.5 07/14/2022   MPG 131 07/23/2018   TSH No results for input(s): "TSH" in the last 8760 hours.   External labs:     Radiology:    Cardiac Studies:   Echocardiogram 01/22/2022:  Normal LV systolic function with visual EF 65-70%. Left ventricle cavity  is normal in size. Mild concentric hypertrophy of the left ventricle.  Normal global wall motion. Normal diastolic filling pattern, normal LAP.  Structurally normal tricuspid valve with trace regurgitation. No evidence  of pulmonary hypertension.  No prior available for comparison.   Exercise Sestamibi stress test 01/20/2022: Exercise nuclear stress test was performed using Bruce protocol.   1 Day Rest and Stress images. Exercise time 02seconds on Bruce protocol, achieved 4.66 METS, 86% of age-predicted maximum heart rate (APMHR).  Stress ECG negative for ischemia.   Normal myocardial perfusion without convincing evidence of reversible myocardial ischemia or prior infarct. Calculated LVEF 60%. Left ventricular size and wall motion preserved. Low risk study.  EKG:   EKG 12/24/21: Sinus Rhythm. Normal axis. Left atrial enlargement. Poor R-wave progression.  Cannot rule out old anterior infarct.  Assessment     ICD-10-CM   1. Essential hypertension  I10     2. Mixed hyperlipidemia  E78.2        No orders of the defined types were placed in this encounter.   No orders of the defined types were placed in this encounter.   There are no discontinued medications.    Recommendations:   IZZABELLA CROWL is a 57 y.o.  AA female with history of hypertension    Primary hypertension Blood pressure is well controlled today.   She does continue to monitor her blood pressure at home and has had well-controlled home readings. Continue Benicar HCT as ordered.   No changes to medications today.   Mixed hyperlipidemia Continue Crestor   Follow-up in 6 months sooner if needed.    Clotilde Dieter, DO  08/13/2022, 2:56 PM Office: [phone] Pager: [phone]

## 2022-09-25 ENCOUNTER — Encounter (HOSPITAL_BASED_OUTPATIENT_CLINIC_OR_DEPARTMENT_OTHER): Payer: Self-pay | Admitting: Cardiology

## 2022-09-26 ENCOUNTER — Other Ambulatory Visit: Payer: Self-pay | Admitting: Internal Medicine

## 2022-10-13 ENCOUNTER — Encounter: Payer: BC Managed Care – PPO | Admitting: Nurse Practitioner

## 2022-10-20 ENCOUNTER — Telehealth: Payer: Self-pay | Admitting: Nurse Practitioner

## 2022-10-20 ENCOUNTER — Encounter: Payer: BC Managed Care – PPO | Admitting: Nurse Practitioner

## 2022-10-20 NOTE — Telephone Encounter (Signed)
Overbooked your scheduled 11/04/2022 with 2nd CPE due to being out of office 10/20/2022.

## 2022-11-04 ENCOUNTER — Other Ambulatory Visit (HOSPITAL_COMMUNITY)
Admission: RE | Admit: 2022-11-04 | Discharge: 2022-11-04 | Disposition: A | Discharge status: Home / Self Care | Payer: BC Managed Care – PPO | Source: Ambulatory Visit | Attending: Nurse Practitioner | Admitting: Nurse Practitioner

## 2022-11-04 ENCOUNTER — Encounter: Payer: Self-pay | Admitting: Nurse Practitioner

## 2022-11-04 ENCOUNTER — Ambulatory Visit (INDEPENDENT_AMBULATORY_CARE_PROVIDER_SITE_OTHER): Payer: BC Managed Care – PPO | Admitting: Nurse Practitioner

## 2022-11-04 VITALS — BP 130/79 | HR 92 | Temp 98.0°F | Resp 16 | Ht 67.0 in | Wt 268.4 lb

## 2022-11-04 DIAGNOSIS — E559 Vitamin D deficiency, unspecified: Secondary | ICD-10-CM | POA: Diagnosis not present

## 2022-11-04 DIAGNOSIS — A5901 Trichomonal vulvovaginitis: Secondary | ICD-10-CM

## 2022-11-04 DIAGNOSIS — I10 Essential (primary) hypertension: Secondary | ICD-10-CM | POA: Diagnosis not present

## 2022-11-04 DIAGNOSIS — Z0001 Encounter for general adult medical examination with abnormal findings: Secondary | ICD-10-CM

## 2022-11-04 DIAGNOSIS — Z7985 Long-term (current) use of injectable non-insulin antidiabetic drugs: Secondary | ICD-10-CM | POA: Diagnosis not present

## 2022-11-04 DIAGNOSIS — E1169 Type 2 diabetes mellitus with other specified complication: Secondary | ICD-10-CM

## 2022-11-04 DIAGNOSIS — Z1159 Encounter for screening for other viral diseases: Secondary | ICD-10-CM | POA: Insufficient documentation

## 2022-11-04 DIAGNOSIS — E785 Hyperlipidemia, unspecified: Secondary | ICD-10-CM

## 2022-11-04 LAB — COMPREHENSIVE METABOLIC PANEL
ALT: 8 U/L (ref 0–35)
AST: 11 U/L (ref 0–37)
Albumin: 4.1 g/dL (ref 3.5–5.2)
Alkaline Phosphatase: 64 U/L (ref 39–117)
BUN: 15 mg/dL (ref 6–23)
CO2: 28 mEq/L (ref 19–32)
Calcium: 9.9 mg/dL (ref 8.4–10.5)
Chloride: 103 mEq/L (ref 96–112)
Creatinine, Ser: 0.8 mg/dL (ref 0.40–1.20)
GFR: 81.78 mL/min (ref >60.00)
Glucose, Bld: 91 mg/dL (ref 70–99)
Potassium: 3.9 mEq/L (ref 3.5–5.1)
Sodium: 139 mEq/L (ref 135–145)
Total Bilirubin: 0.4 mg/dL (ref 0.2–1.2)
Total Protein: 7 g/dL (ref 6.0–8.3)

## 2022-11-04 LAB — LIPID PANEL
Cholesterol: 107 mg/dL (ref 0–200)
HDL: 46.3 mg/dL (ref >39.00)
LDL Cholesterol: 40 mg/dL (ref 0–99)
NonHDL: 60.93
Total CHOL/HDL Ratio: 2
Triglycerides: 105 mg/dL (ref 0.0–149.0)
VLDL: 21 mg/dL (ref 0.0–40.0)

## 2022-11-04 LAB — MICROALBUMIN / CREATININE URINE RATIO
Creatinine,U: 76.1 mg/dL
Microalb Creat Ratio: 0.9 mg/g (ref 0.0–30.0)
Microalb, Ur: <0.7 mg/dL (ref 0.0–1.9)

## 2022-11-04 LAB — VITAMIN D 25 HYDROXY (VIT D DEFICIENCY, FRACTURES): VITD: 14.91 ng/mL — ABNORMAL LOW (ref 30.00–100.00)

## 2022-11-04 LAB — HEMOGLOBIN A1C: Hgb A1c MFr Bld: 6.6 % — ABNORMAL HIGH (ref 4.6–6.5)

## 2022-11-04 MED ORDER — TIRZEPATIDE 5 MG/0.5ML ~~LOC~~ SOAJ: 5.0000 mg | SUBCUTANEOUS | 1 refills | Status: DC

## 2022-11-04 NOTE — Assessment & Plan Note (Signed)
Denies any adverse effects with mounjaro 2.5mg  Upcoming appointment for DIABETES eye exam with [facility]. No neuropathy. Check hgbA1c, lipid panel, cmp, and UACr. Increase mounjaro dose to 5mg  weekly New rx sent F/up in 3months

## 2022-11-04 NOTE — Progress Notes (Signed)
Complete physical exam  Patient: Kimberly Underwood   DOB: [birth year]   57 y.o. Female  MRN: [id number] Visit Date: 11/04/2022  Subjective:    Chief Complaint  Patient presents with   Annual Exam    Fasting Eye exam in April by [facility] F/up of chronic conditions   Kimberly Underwood is a 57 y.o. female who presents today for a complete physical exam. She reports consuming a general diet. No consistent exercise. She generally feels fairly well. She reports sleeping fairly well. She does have additional problems to discuss today.  Vision:Yes Dental:Yes STD Screen:Yes  BP Readings from Last 3 Encounters:  11/04/22 130/79  08/13/22 129/71  08/07/22 106/69   Wt Readings from Last 3 Encounters:  11/04/22 268 lb 6.4 oz (121.7 kg)  08/13/22 269 lb (122 kg)  08/07/22 269 lb (122 kg)   Most recent fall risk assessment:    11/04/2022    1:55 PM  Fall Risk   Falls in the past year? 0  Number falls in past yr: 0  Injury with Fall? 0  Risk for fall due to : No Fall Risks  Follow up Falls evaluation completed   Depression screen:Yes - No Depression Most recent depression screenings:    11/04/2022    1:55 PM 08/12/2021   10:58 AM  PHQ 2/9 Scores  PHQ - 2 Score 1 0  PHQ- 9 Score 7    HPI  DM (diabetes mellitus) (HCC) Denies any adverse effects with mounjaro 2.5mg  Upcoming appointment for DIABETES eye exam with [facility]. No neuropathy. Check hgbA1c, lipid panel, cmp, and UACr. Increase mounjaro dose to 5mg  weekly New rx sent F/up in 3months  Hyperlipidemia associated with type 2 diabetes mellitus (HCC) Current use of crestor with no advised side effects Repeat lipid panel   Hypertensive disorder BP at goal with benicar/hctz and metoprolol BP Readings from Last 3 Encounters:  11/04/22 130/79  08/13/22 129/71  08/07/22 106/69    Maintain med doses  Past Medical History:  Diagnosis Date   Anxiety    Arthritis 2014   CARPAL TUNNEL SYNDROME, BILATERAL 05/22/2010    Dental caries    Dyspnea on exertion 12/24/2021   Gout    History of bronchitis 10/25/2015   History of CT scan of head 07/2014   due to syncope, normal scan   History of echocardiogram 07/17/2014   TTE, normal LV function, 55-60% EF   History of EKG 07/2014   PVCs, otherwise normal   History of nuclear stress test 07/2014   normal   HYPERTENSION 10/25/2008   Impaired fasting blood sugar    Normal cardiac stress test 07/17/2014   Dr. Peter [place]   Overactive bladder    Right lateral epicondylitis 03/09/2017   Vitamin D deficiency    Wears glasses    Past Surgical History:  Procedure Laterality Date   ABDOMINAL HYSTERECTOMY  09/2010   fibroid, partial hysterectomy    TOENAIL EXCISION Bilateral 2000's   "big toes" (12/23/2012)   WISDOM TOOTH EXTRACTION  ~ 1985   Social History   Socioeconomic History   Marital status: Single    Spouse name: Not on file   Number of children: 2   Years of education: Not on file   Highest education level: 12th grade  Occupational History   Occupation: Psychologist, educational: [employer]   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  Works in Theatre stage manager at [employer].   As of 10/2015   Social Determinants of Health   Financial Resource Strain: Low Risk  (07/14/2022)   Overall Financial Resource Strain (CARDIA)    Difficulty of Paying Living Expenses: Not very hard  Food Insecurity: Food Insecurity Present (07/14/2022)   Hunger Vital Sign    Worried About Running Out of Food in the Last Year: Never true    Ran Out of Food in the Last Year: Sometimes  true  Transportation Needs: No Transportation Needs (07/14/2022)   PRAPARE - Administrator, Civil Service (Medical): No    Lack of Transportation (Non-Medical): No  Physical Activity: Insufficiently Active (07/14/2022)   Exercise Vital Sign    Days of Exercise per Week: 2 days    Minutes of Exercise per Session: 30 min  Stress: Stress Concern Present (07/14/2022)   [employer] of Occupational Health - Occupational Stress Questionnaire    Feeling of Stress : Rather much  Social Connections: Moderately Isolated (07/14/2022)   Social Connection and Isolation Panel [NHANES]    Frequency of Communication with Friends and Family: More than three times a week    Frequency of Social Gatherings with Friends and Family: More than three times a week    Attends Religious Services: 1 to 4 times per year    Active Member of [employer] or Organizations: No    Attends Engineer, structural: Not on file    Marital Status: Never married  Intimate Partner Violence: Not At Risk (07/14/2022)   Humiliation, Afraid, Rape, and Kick questionnaire    Fear of Current or Ex-Partner: No    Emotionally Abused: No    Physically Abused: No    Sexually Abused: No   Family Status  Relation Name Status   Mother  Alive       breast CA   Father  Deceased       arrythmia   Sister  Alive   Sister  Alive   Sister  Alive   MGM  (Not Specified)       CVA   MGF  (Not Specified)   Daughter  (Not Specified)   Child  Alive       2, one with mental delay   Neg Hx  (Not Specified)  No partnership data on file   Family History  Problem Relation Age of Onset   Cancer Mother        breast cancer   Hypertension Father    Gout Father    Clotting disorder Father    Lung disease Father        listeria   Stroke Father    Arthritis Father    Heart disease Father    Migraines Sister    Hypertension Sister    Mental illness Sister    Sleep apnea Sister    Stroke Maternal Grandmother    Cancer Maternal  Grandfather        lung cancer (smoking)   Heart disease Daughter        heart murmur   Diabetes Neg Hx  No Known Allergies  Patient Care Team: Delsa Walder, Bonna Gains, NP as PCP - General (Internal Medicine) Jodelle Red, MD as PCP - Cardiology (Cardiology) Savahanna Almendariz, Bonna Gains, NP as Nurse Practitioner (Internal Medicine)   Medications: Outpatient Medications Prior to Visit  Medication Sig   bimatoprost (LUMIGAN) 0.01 % SOLN Place 1 drop into both eyes at bedtime.   brimonidine (ALPHAGAN) 0.2 % ophthalmic solution SMARTSIG:In Eye(s)   famotidine (PEPCID) 20 MG tablet Take 1 tablet (20 mg total) by mouth 2 (two) times daily. For itching and rash.   olmesartan-hydrochlorothiazide (BENICAR HCT) 40-25 MG tablet TAKE 1 TABLET BY MOUTH EVERY DAY   oxybutynin (DITROPAN) 5 MG tablet Take 5 mg by mouth 2 (two) times daily.   rosuvastatin (CRESTOR) 10 MG tablet Take 1 tablet (10 mg total) by mouth daily.   timolol (TIMOPTIC) 0.5 % ophthalmic solution SMARTSIG:In Eye(s)   Vitamin D, Ergocalciferol, (DRISDOL) 1.25 MG (50000 UNIT) CAPS capsule Take 1 capsule (50,000 Units total) by mouth every 7 (seven) days.   [DISCONTINUED] tirzepatide [facility]) 2.5 MG/0.5ML Pen Inject 2.5 mg into the skin once a week.   loratadine (CLARITIN) 10 MG tablet Take 1 tablet (10 mg total) by mouth daily. For itching and rash. (Patient not taking: Reported on 08/13/2022)   No facility-administered medications prior to visit.   Review of Systems  Constitutional:  Negative for activity change, appetite change and unexpected weight change.  Respiratory: Negative.    Cardiovascular: Negative.   Gastrointestinal: Negative.   Endocrine: Negative for cold intolerance and heat intolerance.  Genitourinary: Negative.   Musculoskeletal: Negative.   Skin: Negative.   Neurological: Negative.   Hematological: Negative.   Psychiatric/Behavioral:  Negative for behavioral problems, decreased concentration, dysphoric  mood, hallucinations, self-injury, sleep disturbance and suicidal ideas. The patient is not nervous/anxious.        Objective:  BP 130/79 (BP Location: Left Arm, Patient Position: Sitting, Cuff Size: Large)   Pulse 92   Temp 98 F (36.7 C) (Temporal)   Resp 16   Ht 5\' 7"  (1.702 m)   Wt 268 lb 6.4 oz (121.7 kg)   SpO2 95%   BMI 42.04 kg/m     Physical Exam Vitals and nursing note reviewed.  Constitutional:      General: She is not in acute distress. HENT:     Right Ear: Tympanic membrane, ear canal and external ear normal.     Left Ear: Tympanic membrane, ear canal and external ear normal.     Nose: Nose normal.  Eyes:     Extraocular Movements: Extraocular movements intact.     Conjunctiva/sclera: Conjunctivae normal.     Pupils: Pupils are equal, round, and reactive to light.  Neck:     Thyroid: No thyroid mass, thyromegaly or thyroid tenderness.  Cardiovascular:     Rate and Rhythm: Normal rate and regular rhythm.     Pulses: Normal pulses.     Heart sounds: Normal heart sounds.  Pulmonary:     Effort: Pulmonary effort is normal.     Breath sounds: Normal breath sounds.  Abdominal:     General: Bowel sounds are normal.     Palpations: Abdomen is soft.  Musculoskeletal:        General: Normal range of motion.     Cervical back: Normal range of motion and neck supple.     Right lower leg: No edema.     Left lower leg: No edema.  Lymphadenopathy:     Cervical: No cervical  adenopathy.  Skin:    General: Skin is warm and dry.  Neurological:     Mental Status: She is alert and oriented to person, place, and time.     Cranial Nerves: No cranial nerve deficit.  Psychiatric:        Mood and Affect: Mood normal.        Behavior: Behavior normal.        Thought Content: Thought content normal.     No results found for any visits on 11/04/22.    Assessment & Plan:    Routine Health Maintenance and Physical Exam  Immunization History  Administered Date(s)  Administered   Fluad Quad(high Dose 65+) 05/03/2021   Influenza,inj,Quad PF,6+ Mos 12/24/2012, 12/27/2013   PFIZER(Purple Top)SARS-COV-2 Vaccination 06/25/2019, 07/17/2019, 03/12/2020   Tdap 06/09/2011, 08/12/2021   Zoster Recombinant(Shingrix) 05/03/2021, 08/12/2021   Health Maintenance  Topic Date Due   Hepatitis C Screening  Never done   COVID-19 Vaccine (4 - 2023-24 season) 12/06/2021   INFLUENZA VACCINE  11/06/2022   HEMOGLOBIN A1C  01/13/2023   OPHTHALMOLOGY EXAM  04/23/2023   Diabetic kidney evaluation - eGFR measurement  06/09/2023   Diabetic kidney evaluation - Urine ACR  07/14/2023   FOOT EXAM  07/14/2023   MAMMOGRAM  04/09/2024   Colonoscopy  12/04/2025   DTaP/Tdap/Td (3 - Td or Tdap) 08/13/2031   HIV Screening  Completed   Zoster Vaccines- Shingrix  Completed   HPV VACCINES  Aged Out   Discussed health benefits of physical activity, and encouraged her to engage in regular exercise appropriate for her age and condition.  Problem List Items Addressed This Visit       Cardiovascular and Mediastinum   Hypertensive disorder    BP at goal with benicar/hctz and metoprolol BP Readings from Last 3 Encounters:  11/04/22 130/79  08/13/22 129/71  08/07/22 106/69    Maintain med doses        Endocrine   DM (diabetes mellitus) (HCC)    Denies any adverse effects with mounjaro 2.5mg  Upcoming appointment for DIABETES eye exam with [facility]. No neuropathy. Check hgbA1c, lipid panel, cmp, and UACr. Increase mounjaro dose to 5mg  weekly New rx sent F/up in 3months      Relevant Medications   tirzepatide (MOUNJARO) 5 MG/0.5ML Pen   Other Relevant Orders   Hemoglobin A1c   Microalbumin / creatinine urine ratio   Hyperlipidemia associated with type 2 diabetes mellitus (HCC)    Current use of crestor with no advised side effects Repeat lipid panel       Relevant Medications   tirzepatide (MOUNJARO) 5 MG/0.5ML Pen   Other Relevant Orders   Lipid panel    Other Visit Diagnoses     Encounter for preventative adult health care exam with abnormal findings    -  Primary   Relevant Orders   Comprehensive metabolic panel   Encounter for screening for viral disease       Relevant Orders   HIV Antibody (routine testing w rflx)   RPR   Urine cytology ancillary only   Hepatitis C antibody   Vitamin D deficiency       Relevant Orders   VITAMIN D 25 Hydroxy (Vit-D Deficiency, Fractures)      Return in about 6 months (around 05/07/2023) for HTN, DM, hyperlipidemia (fasting).     Alysia Penna, NP

## 2022-11-04 NOTE — Patient Instructions (Signed)
Go to lab Continue Heart healthy diet and daily exercise. Maintain current medications.  Preventive Care 61-57 Years Old, Female Preventive care refers to lifestyle choices and visits with your health care provider that can promote health and wellness. Preventive care visits are also called wellness exams. What can I expect for my preventive care visit? Counseling Your health care provider may ask you questions about your: Medical history, including: Past medical problems. Family medical history. Pregnancy history. Current health, including: Menstrual cycle. Method of birth control. Emotional well-being. Home life and relationship well-being. Sexual activity and sexual health. Lifestyle, including: Alcohol, nicotine or tobacco, and drug use. Access to firearms. Diet, exercise, and sleep habits. Work and work Astronomer. Sunscreen use. Safety issues such as seatbelt and bike helmet use. Physical exam Your health care provider will check your: Height and weight. These may be used to calculate your BMI (body mass index). BMI is a measurement that tells if you are at a healthy weight. Waist circumference. This measures the distance around your waistline. This measurement also tells if you are at a healthy weight and may help predict your risk of certain diseases, such as type 2 diabetes and high blood pressure. Heart rate and blood pressure. Body temperature. Skin for abnormal spots. What immunizations do I need?  Vaccines are usually given at various ages, according to a schedule. Your health care provider will recommend vaccines for you based on your age, medical history, and lifestyle or other factors, such as travel or where you work. What tests do I need? Screening Your health care provider may recommend screening tests for certain conditions. This may include: Lipid and cholesterol levels. Diabetes screening. This is done by checking your blood sugar (glucose) after you  have not eaten for a while (fasting). Pelvic exam and Pap test. Hepatitis B test. Hepatitis C test. HIV (human immunodeficiency virus) test. STI (sexually transmitted infection) testing, if you are at risk. Lung cancer screening. Colorectal cancer screening. Mammogram. Talk with your health care provider about when you should start having regular mammograms. This may depend on whether you have a family history of breast cancer. BRCA-related cancer screening. This may be done if you have a family history of breast, ovarian, tubal, or peritoneal cancers. Bone density scan. This is done to screen for osteoporosis. Talk with your health care provider about your test results, treatment options, and if necessary, the need for more tests. Follow these instructions at home: Eating and drinking  Eat a diet that includes fresh fruits and vegetables, whole grains, lean protein, and low-fat dairy products. Take vitamin and mineral supplements as recommended by your health care provider. Do not drink alcohol if: Your health care provider tells you not to drink. You are pregnant, may be pregnant, or are planning to become pregnant. If you drink alcohol: Limit how much you have to 0-1 drink a day. Know how much alcohol is in your drink. In the [place] one drink equals one 12 oz bottle of beer (355 mL), one 5 oz glass of wine (148 mL), or one 1 oz glass of hard liquor (44 mL). Lifestyle Brush your teeth every morning and night with fluoride toothpaste. Floss one time each day. Exercise for at least 30 minutes 5 or more days each week. Do not use any products that contain nicotine or tobacco. These products include cigarettes, chewing tobacco, and vaping devices, such as e-cigarettes. If you need help quitting, ask your health care provider. Do not use drugs. If you are  sexually active, practice safe sex. Use a condom or other form of protection to prevent STIs. If you do not wish to become pregnant, use  a form of birth control. If you plan to become pregnant, see your health care provider for a prepregnancy visit. Take aspirin only as told by your health care provider. Make sure that you understand how much to take and what form to take. Work with your health care provider to find out whether it is safe and beneficial for you to take aspirin daily. Find healthy ways to manage stress, such as: Meditation, yoga, or listening to music. Journaling. Talking to a trusted person. Spending time with friends and family. Minimize exposure to UV radiation to reduce your risk of skin cancer. Safety Always wear your seat belt while driving or riding in a vehicle. Do not drive: If you have been drinking alcohol. Do not ride with someone who has been drinking. When you are tired or distracted. While texting. If you have been using any mind-altering substances or drugs. Wear a helmet and other protective equipment during sports activities. If you have firearms in your house, make sure you follow all gun safety procedures. Seek help if you have been physically or sexually abused. What's next? Visit your health care provider once a year for an annual wellness visit. Ask your health care provider how often you should have your eyes and teeth checked. Stay up to date on all vaccines. This information is not intended to replace advice given to you by your health care provider. Make sure you discuss any questions you have with your health care provider. Document Revised: 09/19/2020 Document Reviewed: 09/19/2020 Elsevier Patient Education  2024 [employer].

## 2022-11-04 NOTE — Assessment & Plan Note (Signed)
Current use of crestor with no advised side effects Repeat lipid panel

## 2022-11-04 NOTE — Assessment & Plan Note (Signed)
BP at goal with benicar/hctz and metoprolol BP Readings from Last 3 Encounters:  11/04/22 130/79  08/13/22 129/71  08/07/22 106/69    Maintain med doses

## 2022-11-06 MED ORDER — VITAMIN D (ERGOCALCIFEROL) 1.25 MG (50000 UNIT) PO CAPS
50000.0000 [IU] | ORAL_CAPSULE | ORAL | 0 refills | Status: DC
Start: 2022-11-06 — End: 2023-08-08

## 2022-11-06 MED ORDER — METRONIDAZOLE 500 MG PO TABS
2000.0000 mg | ORAL_TABLET | Freq: Once | ORAL | 0 refills | Status: AC
Start: 2022-11-06 — End: 2022-11-06

## 2022-11-06 NOTE — Addendum Note (Signed)
Addended by: Michaela Corner on: 11/06/2022 04:22 PM   Modules accepted: Orders

## 2022-11-19 DIAGNOSIS — H2513 Age-related nuclear cataract, bilateral: Secondary | ICD-10-CM | POA: Diagnosis not present

## 2022-11-19 DIAGNOSIS — E119 Type 2 diabetes mellitus without complications: Secondary | ICD-10-CM | POA: Diagnosis not present

## 2022-11-19 DIAGNOSIS — H401131 Primary open-angle glaucoma, bilateral, mild stage: Secondary | ICD-10-CM | POA: Diagnosis not present

## 2022-12-17 ENCOUNTER — Encounter: Payer: Self-pay | Admitting: Neurology

## 2022-12-17 ENCOUNTER — Other Ambulatory Visit: Payer: Self-pay

## 2022-12-17 DIAGNOSIS — G4719 Other hypersomnia: Secondary | ICD-10-CM

## 2022-12-17 DIAGNOSIS — G4730 Sleep apnea, unspecified: Secondary | ICD-10-CM

## 2022-12-17 DIAGNOSIS — R0681 Apnea, not elsewhere classified: Secondary | ICD-10-CM

## 2022-12-17 DIAGNOSIS — R519 Headache, unspecified: Secondary | ICD-10-CM

## 2022-12-17 NOTE — Telephone Encounter (Signed)
New order pended to MD.

## 2022-12-17 NOTE — Telephone Encounter (Signed)
Reaching out to DME

## 2023-01-03 LAB — AMB RESULTS CONSOLE CBG: Glucose: 127

## 2023-01-14 NOTE — Progress Notes (Signed)
Patient attended screening event 01/03/2023. Patient Blood pressure 116/80 and blood glucose 127. Patient declined to answer SDOH questionnaire at this time.

## 2023-02-17 DIAGNOSIS — K760 Fatty (change of) liver, not elsewhere classified: Secondary | ICD-10-CM | POA: Diagnosis not present

## 2023-02-17 DIAGNOSIS — E119 Type 2 diabetes mellitus without complications: Secondary | ICD-10-CM | POA: Diagnosis not present

## 2023-02-17 DIAGNOSIS — I10 Essential (primary) hypertension: Secondary | ICD-10-CM | POA: Diagnosis not present

## 2023-02-18 DIAGNOSIS — H5203 Hypermetropia, bilateral: Secondary | ICD-10-CM | POA: Diagnosis not present

## 2023-02-18 DIAGNOSIS — H2513 Age-related nuclear cataract, bilateral: Secondary | ICD-10-CM | POA: Diagnosis not present

## 2023-02-18 DIAGNOSIS — E119 Type 2 diabetes mellitus without complications: Secondary | ICD-10-CM | POA: Diagnosis not present

## 2023-02-18 DIAGNOSIS — H52222 Regular astigmatism, left eye: Secondary | ICD-10-CM | POA: Diagnosis not present

## 2023-02-18 DIAGNOSIS — H401131 Primary open-angle glaucoma, bilateral, mild stage: Secondary | ICD-10-CM | POA: Diagnosis not present

## 2023-02-18 DIAGNOSIS — H524 Presbyopia: Secondary | ICD-10-CM | POA: Diagnosis not present

## 2023-02-18 LAB — LIPID PANEL
Chol/HDL Ratio: 2.2 {ratio} (ref 0.0–4.4)
Cholesterol, Total: 101 mg/dL (ref 100–199)
HDL: 45 mg/dL (ref >39)
LDL Chol Calc (NIH): 37 mg/dL (ref 0–99)
Triglycerides: 104 mg/dL (ref 0–149)
VLDL Cholesterol Cal: 19 mg/dL (ref 5–40)

## 2023-02-18 LAB — HEPATIC FUNCTION PANEL
ALT: 8 [IU]/L (ref 0–32)
AST: 13 [IU]/L (ref 0–40)
Albumin: 4.1 g/dL (ref 3.8–4.9)
Alkaline Phosphatase: 93 [IU]/L (ref 44–121)
Bilirubin Total: 0.2 mg/dL (ref 0.0–1.2)
Bilirubin, Direct: 0.12 mg/dL (ref 0.00–0.40)
Total Protein: 6.9 g/dL (ref 6.0–8.5)

## 2023-02-18 LAB — HM DIABETES EYE EXAM

## 2023-03-14 ENCOUNTER — Other Ambulatory Visit: Payer: Self-pay | Admitting: Nurse Practitioner

## 2023-03-14 DIAGNOSIS — E1165 Type 2 diabetes mellitus with hyperglycemia: Secondary | ICD-10-CM

## 2023-03-17 NOTE — Progress Notes (Signed)
The patient attended 01/03/23 screening event where her screening results were wnl. At the event the patient noted she has a pcp. Patient declined the SDOH questionnaire. Per chart review the pt does have a pcp, last ov with pcp was 11/04/22. Chart review also indicates a future appt with pcp on 05/07/23. No additional Health equity team support indicated at this time.

## 2023-03-25 ENCOUNTER — Other Ambulatory Visit: Payer: Self-pay | Admitting: Nurse Practitioner

## 2023-03-25 DIAGNOSIS — E559 Vitamin D deficiency, unspecified: Secondary | ICD-10-CM

## 2023-03-26 NOTE — Telephone Encounter (Signed)
Spoke to patient and informed her to take OTC vitamin D2 5000 units daily. Patient verbalized understanding.

## 2023-03-26 NOTE — Telephone Encounter (Signed)
Copied from CRM [id number]. Topic: Clinical - Medication Refill >> Mar 26, 2023 11:29 AM Lorretta Harp wrote: Most Recent Primary Care Visit:  Provider: Anne Ng  Department: [facility]  Visit Type: PHYSICAL  Date: 11/04/2022  Medication: tirzepatide [facility]) 5 MG/0.5ML Pen and Vitamin D, Ergocalciferol, (DRISDOL) 1.25 MG (50000 UNIT) CAPS    Has the patient contacted their pharmacy? Yes (Agent: If no, request that the patient contact the pharmacy for the refill. If patient does not wish to contact the pharmacy document the reason why and proceed with request.) (Agent: If yes, when and what did the pharmacy advise?)  Is this the correct pharmacy for this prescription? Yes If no, delete pharmacy and type the correct one.  This is the patient's preferred pharmacy:  [id number] - [place],  - [address] AT [facility] [address] [place] [address] Phone: [phone] Fax: [phone]   Has the prescription been filled recently? No  Is the patient out of the medication? No  Has the patient been seen for an appointment in the last year OR does the patient have an upcoming appointment? Yes  Can we respond through MyChart? No  Agent: Please be advised that Rx refills may take up to 3 business days. We ask that you follow-up with your pharmacy.

## 2023-04-15 DIAGNOSIS — Z1231 Encounter for screening mammogram for malignant neoplasm of breast: Secondary | ICD-10-CM | POA: Diagnosis not present

## 2023-04-15 LAB — HM MAMMOGRAPHY

## 2023-04-17 ENCOUNTER — Encounter: Payer: Self-pay | Admitting: Nurse Practitioner

## 2023-05-07 ENCOUNTER — Encounter: Payer: Self-pay | Admitting: Nurse Practitioner

## 2023-05-07 ENCOUNTER — Ambulatory Visit: Payer: BC Managed Care – PPO | Admitting: Nurse Practitioner

## 2023-05-07 VITALS — BP 116/71 | HR 75 | Temp 98.2°F | Resp 18 | Wt 261.0 lb

## 2023-05-07 DIAGNOSIS — I10 Essential (primary) hypertension: Secondary | ICD-10-CM

## 2023-05-07 DIAGNOSIS — E66813 Obesity, class 3: Secondary | ICD-10-CM

## 2023-05-07 DIAGNOSIS — Z23 Encounter for immunization: Secondary | ICD-10-CM

## 2023-05-07 DIAGNOSIS — Z6841 Body Mass Index (BMI) 40.0 and over, adult: Secondary | ICD-10-CM

## 2023-05-07 DIAGNOSIS — E1169 Type 2 diabetes mellitus with other specified complication: Secondary | ICD-10-CM | POA: Diagnosis not present

## 2023-05-07 LAB — RENAL FUNCTION PANEL
Albumin: 4.1 g/dL (ref 3.5–5.2)
BUN: 14 mg/dL (ref 6–23)
CO2: 30 meq/L (ref 19–32)
Calcium: 9.7 mg/dL (ref 8.4–10.5)
Chloride: 100 meq/L (ref 96–112)
Creatinine, Ser: 0.81 mg/dL (ref 0.40–1.20)
GFR: 80.29 mL/min (ref >60.00)
Glucose, Bld: 91 mg/dL (ref 70–99)
Phosphorus: 3.6 mg/dL (ref 2.3–4.6)
Potassium: 3.5 meq/L (ref 3.5–5.1)
Sodium: 138 meq/L (ref 135–145)

## 2023-05-07 LAB — HEMOGLOBIN A1C: Hgb A1c MFr Bld: 6.7 % — ABNORMAL HIGH (ref 4.6–6.5)

## 2023-05-07 NOTE — Assessment & Plan Note (Signed)
Denies any adverse effects with mounjaro 5mg  UTD with DIABETES eye exam with [facility]: no retinopathy, glaucoma under control No neuropathy and nephropathy LDL at goal with crestor  Repeat hgbA1c and BMP Maintain med dose F/up in 6months

## 2023-05-07 NOTE — Assessment & Plan Note (Signed)
Lost 8lbs with mounjaro in last 6months Has decreased meal portions and maintain low fat/low carb/low sugar diet Has also incorporated daily exercise through [employer] management program Wt Readings from Last 3 Encounters:  05/07/23 261 lb (118.4 kg)  11/04/22 268 lb 6.4 oz (121.7 kg)  08/13/22 269 lb (122 kg)    Encouraged to maintain lifestyle modifications

## 2023-05-07 NOTE — Progress Notes (Signed)
Established Patient Visit  Patient: Kimberly Underwood   DOB: 1965-10-26   58 y.o. Female  MRN: [id number] Visit Date: 05/07/2023  Subjective:    Chief Complaint  Patient presents with   Medical Managment for Chronic Issues     6 month follow up hypertension, DM and hyperlipidemia; Flu and prenvar 20 vaccine is due    HPI Hypertensive disorder BP at goal with benicar/hctz and metoprolol BP Readings from Last 3 Encounters:  05/07/23 116/71  01/03/23 116/80  11/04/22 130/79    Maintain med doses Repeat BMP F/up in 6months  DM (diabetes mellitus) (HCC) Denies any adverse effects with mounjaro 5mg  UTD with DIABETES eye exam with [facility]: no retinopathy, glaucoma under control No neuropathy and nephropathy LDL at goal with crestor  Repeat hgbA1c and BMP Maintain med dose F/up in 6months  Class 3 severe obesity due to excess calories with serious comorbidity and body mass index (BMI) of 40.0 to 44.9 in adult [facility]) Lost 8lbs with mounjaro in last 6months Has decreased meal portions and maintain low fat/low carb/low sugar diet Has also incorporated daily exercise through [employer] management program Wt Readings from Last 3 Encounters:  05/07/23 261 lb (118.4 kg)  11/04/22 268 lb 6.4 oz (121.7 kg)  08/13/22 269 lb (122 kg)    Encouraged to maintain lifestyle modifications  Wt Readings from Last 3 Encounters:  05/07/23 261 lb (118.4 kg)  11/04/22 268 lb 6.4 oz (121.7 kg)  08/13/22 269 lb (122 kg)    Reviewed medical, surgical, and social history today  Medications: Outpatient Medications Prior to Visit  Medication Sig   bimatoprost (LUMIGAN) 0.01 % SOLN Place 1 drop into both eyes at bedtime.   brimonidine (ALPHAGAN) 0.2 % ophthalmic solution SMARTSIG:In Eye(s)   famotidine (PEPCID) 20 MG tablet Take 1 tablet (20 mg total) by mouth 2 (two) times daily. For itching and rash.   latanoprost (XALATAN) 0.005 % ophthalmic solution 1 drop at  bedtime.   loratadine (CLARITIN) 10 MG tablet Take 1 tablet (10 mg total) by mouth daily. For itching and rash.   olmesartan-hydrochlorothiazide (BENICAR HCT) 40-25 MG tablet TAKE 1 TABLET BY MOUTH EVERY DAY   oxybutynin (DITROPAN) 5 MG tablet Take 5 mg by mouth 2 (two) times daily.   timolol (TIMOPTIC) 0.5 % ophthalmic solution SMARTSIG:In Eye(s)   tirzepatide (MOUNJARO) 5 MG/0.5ML Pen Inject 5 mg into the skin once a week.   Vitamin D, Ergocalciferol, (DRISDOL) 1.25 MG (50000 UNIT) CAPS capsule Take 1 capsule (50,000 Units total) by mouth every 7 (seven) days.   rosuvastatin (CRESTOR) 10 MG tablet Take 1 tablet (10 mg total) by mouth daily.   No facility-administered medications prior to visit.   Reviewed past medical and social history.   ROS per HPI above      Objective:  BP 116/71 (BP Location: Left Arm, Patient Position: Sitting, Cuff Size: Large)   Pulse 75   Temp 98.2 F (36.8 C) (Temporal)   Resp 18   Wt 261 lb (118.4 kg)   SpO2 98%   BMI 40.88 kg/m      Physical Exam Vitals and nursing note reviewed.  Constitutional:      Appearance: She is obese.  Cardiovascular:     Rate and Rhythm: Normal rate and regular rhythm.     Pulses: Normal pulses.     Heart sounds: Normal heart sounds.  Pulmonary:  Effort: Pulmonary effort is normal.     Breath sounds: Normal breath sounds.  Musculoskeletal:     Right lower leg: No edema.     Left lower leg: No edema.  Neurological:     Mental Status: She is alert and oriented to person, place, and time.  Psychiatric:        Mood and Affect: Mood normal.        Behavior: Behavior normal.        Thought Content: Thought content normal.     No results found for any visits on 05/07/23.    Assessment & Plan:    Problem List Items Addressed This Visit     Class 3 severe obesity due to excess calories with serious comorbidity and body mass index (BMI) of 40.0 to 44.9 in adult [facility])   Lost 8lbs with mounjaro in last  6months Has decreased meal portions and maintain low fat/low carb/low sugar diet Has also incorporated daily exercise through [employer] management program Wt Readings from Last 3 Encounters:  05/07/23 261 lb (118.4 kg)  11/04/22 268 lb 6.4 oz (121.7 kg)  08/13/22 269 lb (122 kg)    Encouraged to maintain lifestyle modifications      DM (diabetes mellitus) (HCC)   Denies any adverse effects with mounjaro 5mg  UTD with DIABETES eye exam with [facility]: no retinopathy, glaucoma under control No neuropathy and nephropathy LDL at goal with crestor  Repeat hgbA1c and BMP Maintain med dose F/up in 6months      Relevant Orders   Hemoglobin A1c   Renal Function Panel   Hypertensive disorder   BP at goal with benicar/hctz and metoprolol BP Readings from Last 3 Encounters:  05/07/23 116/71  01/03/23 116/80  11/04/22 130/79    Maintain med doses Repeat BMP F/up in 6months      Relevant Orders   Renal Function Panel   Other Visit Diagnoses       Immunization due    -  Primary   Relevant Orders   Pneumococcal conjugate vaccine 20-valent (Prevnar 20) (Completed)   Flu Vaccine Trivalent High Dose (Fluad) (Completed)      Return in about 6 months (around 11/04/2023) for HTN, DM, hyperlipidemia (fasting).     Alysia Penna, NP

## 2023-05-07 NOTE — Telephone Encounter (Signed)
RE: new PAP user Received: Today Zott, Hennie Duos, RN; [place], Tammy; Zott, [place], Unfortunately she is going to need to restart the process her sleep testing is to old to qualify her for insurance to cover the equipment. Please advise Thank You     Previous Messages    ----- Message ----- From: Guy Begin, RN Sent: 05/07/2023   9:42 AM EST To: [name]; [name] Subject: new PAP user                                  Kimberly Underwood Female, 58 y.o., [birth year] MRN: [id number]   New order in EPIC ready to proceed now  [place]

## 2023-05-07 NOTE — Assessment & Plan Note (Addendum)
BP at goal with benicar/hctz and metoprolol BP Readings from Last 3 Encounters:  05/07/23 116/71  01/03/23 116/80  11/04/22 130/79    Maintain med doses Repeat BMP F/up in 6months

## 2023-05-07 NOTE — Patient Instructions (Signed)
Go to lab Maintain Heart healthy diet and daily exercise. Maintain current medications.

## 2023-05-26 ENCOUNTER — Ambulatory Visit
Admission: EM | Admit: 2023-05-26 | Discharge: 2023-05-26 | Disposition: A | Discharge status: Home / Self Care | Payer: BC Managed Care – PPO | Attending: Family Medicine | Admitting: Family Medicine

## 2023-05-26 ENCOUNTER — Other Ambulatory Visit: Payer: Self-pay

## 2023-05-26 DIAGNOSIS — R6883 Chills (without fever): Secondary | ICD-10-CM

## 2023-05-26 DIAGNOSIS — J09X2 Influenza due to identified novel influenza A virus with other respiratory manifestations: Secondary | ICD-10-CM | POA: Diagnosis not present

## 2023-05-26 LAB — POC COVID19/FLU A&B COMBO
Covid Antigen, POC: NEGATIVE
Influenza A Antigen, POC: POSITIVE — AB
Influenza B Antigen, POC: NEGATIVE

## 2023-05-26 NOTE — ED Triage Notes (Signed)
Pt presents with complaints of flu-like symptoms x 5 days. Pt currently reports fatigue, generalized body aches, chills, and fevers. Pt currently rates her overall pain a 4/10. OTC Dayquil & Nyquil with relief. Pt's coworkers are out sick currently.

## 2023-05-26 NOTE — ED Provider Notes (Signed)
[facility]    CSN: [id number] Arrival date & time: 05/26/23  1046      History   Chief Complaint Chief Complaint  Patient presents with   Fatigue   Generalized Body Aches    HPI Kimberly Underwood is a 58 y.o. female.   HPI Not feeling well for 5 days symptoms include chills, body aches, fatigue, subjective fever, rhinorrhea, nasal congestion, cough, headache.  Admits decreased appetite.  Denies vomiting, diarrhea, rashes or skin changes, abdominal pain, chest pain, shortness of breath.  Admits work contacts with illness.  Past medical history includes hypertension, type 2 diabetes, glaucoma.  No known drug allergies.  Past Medical History:  Diagnosis Date   Anxiety    Arthritis 2014   CARPAL TUNNEL SYNDROME, BILATERAL 05/22/2010   Dental caries    Dyspnea on exertion 12/24/2021   Gout    History of bronchitis 10/25/2015   History of CT scan of head 07/2014   due to syncope, normal scan   History of echocardiogram 07/17/2014   TTE, normal LV function, 55-60% EF   History of EKG 07/2014   PVCs, otherwise normal   History of nuclear stress test 07/2014   normal   HYPERTENSION 10/25/2008   Impaired fasting blood sugar    Normal cardiac stress test 07/17/2014   Dr. Peter [place]   Overactive bladder    Right lateral epicondylitis 03/09/2017   Vitamin D deficiency    Wears glasses     Patient Active Problem List   Diagnosis Date Noted   Nonalcoholic fatty liver 07/14/2022   Hyperlipidemia associated with type 2 diabetes mellitus (HCC) 04/14/2022   Hives of unknown origin 01/07/2022   OSA (obstructive sleep apnea) 09/30/2021   History of hysterectomy 05/03/2021   DM (diabetes mellitus) (HCC) 07/14/2020   Class 3 severe obesity due to excess calories with serious comorbidity and body mass index (BMI) of 40.0 to 44.9 in adult (HCC) 07/14/2020   Degenerative arthritis of knee, bilateral 03/09/2017   Microscopic hematuria 10/25/2015   Overactive bladder  09/26/2014   Anxiety 09/26/2014   Arthritis    Gout    Hypertensive disorder 12/27/2013   Cervical dystonia 11/15/2012   CARPAL TUNNEL SYNDROME, BILATERAL 05/22/2010    Past Surgical History:  Procedure Laterality Date   ABDOMINAL HYSTERECTOMY  09/2010   fibroid, partial hysterectomy    TOENAIL EXCISION Bilateral 2000's   "big toes" (12/23/2012)   WISDOM TOOTH EXTRACTION  ~ 1985    OB History   No obstetric history on file.      Home Medications    Prior to Admission medications   Medication Sig Start Date End Date Taking? Authorizing Provider  bimatoprost (LUMIGAN) 0.01 % SOLN Place 1 drop into both eyes at bedtime.    [provider]  brimonidine (ALPHAGAN) 0.2 % ophthalmic solution SMARTSIG:In Eye(s) 07/28/22   [provider]  famotidine (PEPCID) 20 MG tablet Take 1 tablet (20 mg total) by mouth 2 (two) times daily. For itching and rash. 01/01/22   Ellsworth Lennox, PA-C  latanoprost (XALATAN) 0.005 % ophthalmic solution 1 drop at bedtime. 01/03/23   [provider]  loratadine (CLARITIN) 10 MG tablet Take 1 tablet (10 mg total) by mouth daily. For itching and rash. 01/01/22   Ellsworth Lennox, PA-C  olmesartan-hydrochlorothiazide (BENICAR HCT) 40-25 MG tablet TAKE 1 TABLET BY MOUTH EVERY DAY 09/26/22   Tolia, Sunit, DO  oxybutynin (DITROPAN) 5 MG tablet Take 5 mg by mouth 2 (two) times  daily. 03/28/22   [provider]  rosuvastatin (CRESTOR) 10 MG tablet Take 1 tablet (10 mg total) by mouth daily. 08/07/22 11/05/22  Jodelle Red, MD  timolol (TIMOPTIC) 0.5 % ophthalmic solution SMARTSIG:In Eye(s) 11/13/21   [provider]  tirzepatide Greggory Keen) 5 MG/0.5ML Pen Inject 5 mg into the skin once a week. 11/04/22   Nche, Bonna Gains, NP  Vitamin D, Ergocalciferol, (DRISDOL) 1.25 MG (50000 UNIT) CAPS capsule Take 1 capsule (50,000 Units total) by mouth every 7 (seven) days. 11/06/22   Nche, Bonna Gains, NP    Family History Family History   Problem Relation Age of Onset   Cancer Mother        breast cancer   Hypertension Father    Gout Father    Clotting disorder Father    Lung disease Father        listeria   Stroke Father    Arthritis Father    Heart disease Father    Migraines Sister    Hypertension Sister    Mental illness Sister    Sleep apnea Sister    Stroke Maternal Grandmother    Cancer Maternal Grandfather        lung cancer (smoking)   Heart disease Daughter        heart murmur   Diabetes Neg Hx     Social History Social History   Tobacco Use   Smoking status: Never   Smokeless tobacco: Never  Vaping Use   Vaping status: Never Used  Substance Use Topics   Alcohol use: Not Currently   Drug use: No     Allergies   Patient has no known allergies.   Review of Systems Review of Systems   Physical Exam Triage Vital Signs ED Triage Vitals  Encounter Vitals Group     BP 05/26/23 1143 138/79     Systolic BP Percentile --      Diastolic BP Percentile --      Pulse Rate 05/26/23 1143 90     Resp 05/26/23 1143 18     Temp 05/26/23 1143 97.6 F (36.4 C)     Temp Source 05/26/23 1143 Oral     SpO2 05/26/23 1143 94 %     Weight 05/26/23 1142 260 lb (117.9 kg)     Height 05/26/23 1142 5\' 7"  (1.702 m)     Head Circumference --      Peak Flow --      Pain Score 05/26/23 1142 4     Pain Loc --      Pain Education --      Exclude from Growth Chart --    No data found.  Updated Vital Signs BP 138/79 (BP Location: Right Arm)   Pulse 90   Temp 97.6 F (36.4 C) (Oral)   Resp 18   Ht 5\' 7"  (1.702 m)   Wt 260 lb (117.9 kg)   SpO2 94%   BMI 40.72 kg/m   Visual Acuity Right Eye Distance:   Left Eye Distance:   Bilateral Distance:    Right Eye Near:   Left Eye Near:    Bilateral Near:     Physical Exam Vitals and nursing note reviewed.  Constitutional:      Appearance: She is not ill-appearing.  HENT:     Head: Normocephalic and atraumatic.     Right Ear: Tympanic membrane  and ear canal normal.     Left Ear: Tympanic membrane normal.     Nose:  No rhinorrhea.     Mouth/Throat:     Mouth: Mucous membranes are moist.     Pharynx: Oropharynx is clear. No oropharyngeal exudate or posterior oropharyngeal erythema.  Eyes:     General:        Right eye: No discharge.        Left eye: No discharge.     Conjunctiva/sclera: Conjunctivae normal.  Cardiovascular:     Rate and Rhythm: Normal rate.  Pulmonary:     Effort: Pulmonary effort is normal. No respiratory distress.     Breath sounds: Normal breath sounds. No wheezing or rales.  Musculoskeletal:     Cervical back: Neck supple. No rigidity.  Lymphadenopathy:     Cervical: No cervical adenopathy.  Skin:    General: Skin is warm and dry.  Neurological:     Mental Status: She is alert and oriented to person, place, and time.  Psychiatric:        Mood and Affect: Mood normal.      UC Treatments / Results  Labs (all labs ordered are listed, but only abnormal results are displayed) Labs Reviewed - No data to display  EKG   Radiology No results found.  Procedures Procedures (including critical care time)  Medications Ordered in UC Medications - No data to display  Initial Impression / Assessment and Plan / UC Course  I have reviewed the triage vital signs and the nursing notes.  Pertinent labs & imaging results that were available during my care of the patient were reviewed by me and considered in my medical decision making (see chart for details).     58 year old female with 5 days of flulike symptoms, she is well-appearing, afebrile, nontoxic, no evidence of bacterial infection on exam.  Point-of-care COVID is negative, point-of-care flu is positive for a period Home care and follow-up reviewed with patient  Final Clinical Impressions(s) / UC Diagnoses   Final diagnoses:  None   Discharge Instructions   None    ED Prescriptions   None    PDMP not reviewed this encounter.    Meliton Rattan, [place] 05/26/23 1201

## 2023-05-26 NOTE — Discharge Instructions (Signed)
Over-the-counter medicines for symptoms consider Coricidin HBP products Follow-up as needed

## 2023-07-05 ENCOUNTER — Other Ambulatory Visit (HOSPITAL_BASED_OUTPATIENT_CLINIC_OR_DEPARTMENT_OTHER): Payer: Self-pay | Admitting: Cardiology

## 2023-07-05 DIAGNOSIS — E119 Type 2 diabetes mellitus without complications: Secondary | ICD-10-CM

## 2023-08-04 ENCOUNTER — Other Ambulatory Visit: Payer: Self-pay | Admitting: Cardiology

## 2023-08-05 ENCOUNTER — Other Ambulatory Visit: Payer: Self-pay | Admitting: Nurse Practitioner

## 2023-08-05 DIAGNOSIS — E1165 Type 2 diabetes mellitus with hyperglycemia: Secondary | ICD-10-CM

## 2023-08-05 DIAGNOSIS — E559 Vitamin D deficiency, unspecified: Secondary | ICD-10-CM

## 2023-08-05 DIAGNOSIS — E1169 Type 2 diabetes mellitus with other specified complication: Secondary | ICD-10-CM

## 2023-08-07 NOTE — Telephone Encounter (Signed)
 Medication: Vitamin D  1.25 mg (50,000 units) Directions: Take 1 capsule by mouth every 7 days Last given: 11/06/22 Number refills: 0 Last o/v: 05/07/23 Follow up: 6 months- 11/04/23 (Scheduled) Labs: 10/25/22

## 2023-08-07 NOTE — Telephone Encounter (Signed)
 Called patient to verify the Mounjaro  dose because what came in from patient's pharmacy is for 2.5 mg to inject into skin weekly and what is listed is 5 mg to inject into skin weekly. Patient stated that she was at work currently so she could not check what the dose but she believes that it is the 5 mg weekly. She stated that she does need refills. I informed her that I will pass this along to [facility] to review.   I also asked if she is still doing the vitamin d  every 7 days and she said yes that refills are needed for that as well.

## 2023-08-08 MED ORDER — TIRZEPATIDE 5 MG/0.5ML ~~LOC~~ SOAJ: 5.0000 mg | SUBCUTANEOUS | 1 refills | Status: DC

## 2023-08-11 NOTE — Telephone Encounter (Signed)
 Copied from CRM [id number]. Topic: General - Other >> Aug 10, 2023 11:06 AM Allyne Areola wrote: Reason for CRM: Patient returning a call she received from Stefani Edin. I advised of the message.

## 2023-08-13 ENCOUNTER — Ambulatory Visit: Payer: Self-pay | Admitting: Cardiology

## 2023-08-14 ENCOUNTER — Other Ambulatory Visit: Payer: Self-pay

## 2023-08-14 MED ORDER — OLMESARTAN MEDOXOMIL-HCTZ 40-25 MG PO TABS: 1.0000 | ORAL_TABLET | Freq: Every day | ORAL | 0 refills | Status: DC

## 2023-08-18 DIAGNOSIS — H2513 Age-related nuclear cataract, bilateral: Secondary | ICD-10-CM | POA: Diagnosis not present

## 2023-08-18 DIAGNOSIS — H401131 Primary open-angle glaucoma, bilateral, mild stage: Secondary | ICD-10-CM | POA: Diagnosis not present

## 2023-09-21 ENCOUNTER — Encounter: Payer: Self-pay | Admitting: Neurology

## 2023-09-21 ENCOUNTER — Ambulatory Visit (INDEPENDENT_AMBULATORY_CARE_PROVIDER_SITE_OTHER): Admitting: Neurology

## 2023-09-21 ENCOUNTER — Telehealth: Payer: Self-pay | Admitting: Nurse Practitioner

## 2023-09-21 VITALS — BP 119/76 | HR 76 | Ht 67.0 in | Wt 256.0 lb

## 2023-09-21 DIAGNOSIS — G4733 Obstructive sleep apnea (adult) (pediatric): Secondary | ICD-10-CM | POA: Diagnosis not present

## 2023-09-21 DIAGNOSIS — R634 Abnormal weight loss: Secondary | ICD-10-CM | POA: Diagnosis not present

## 2023-09-21 DIAGNOSIS — E1169 Type 2 diabetes mellitus with other specified complication: Secondary | ICD-10-CM

## 2023-09-21 DIAGNOSIS — G4719 Other hypersomnia: Secondary | ICD-10-CM | POA: Diagnosis not present

## 2023-09-21 DIAGNOSIS — R351 Nocturia: Secondary | ICD-10-CM

## 2023-09-21 DIAGNOSIS — G4726 Circadian rhythm sleep disorder, shift work type: Secondary | ICD-10-CM

## 2023-09-21 NOTE — Telephone Encounter (Unsigned)
 Copied from CRM [id number]. Topic: Clinical - Medication Refill >> Sep 21, 2023 11:45 AM Clyde Darling P wrote: Medication: tirzepatide  (MOUNJARO ) 5 MG/0.5ML Pen  Has the patient contacted their pharmacy? Yes-pharmacy advise they sent a refill request (Agent: If no, request that the patient contact the pharmacy for the refill. If patient does not wish to contact the pharmacy document the reason why and proceed with request.) (Agent: If yes, when and what did the pharmacy advise?)  This is the patient's preferred pharmacy:  [id number] - [place], [place] - [address] AT [facility] [address] [place] [address] Phone: [phone] Fax: [phone]  Is this the correct pharmacy for this prescription? Yes If no, delete pharmacy and type the correct one.   Has the prescription been filled recently? No  Is the patient out of the medication? Yes  Has the patient been seen for an appointment in the last year OR does the patient have an upcoming appointment? Yes  Can we respond through MyChart? Yes  Agent: Please be advised that Rx refills may take up to 3 business days. We ask that you follow-up with your pharmacy.

## 2023-09-21 NOTE — Progress Notes (Signed)
 Subjective:    Patient ID: Kimberly Underwood is a 58 y.o. female.  HPI    Interim history:   Kimberly Underwood is a 58 year old female with an underlying medical history of arthritis, hypertension, vitamin D  deficiency, overactive bladder, anxiety and severe obesity with a BMI of over 40, who presents for follow-up consultation of her obstructive sleep apnea.  The patient is unaccompanied today.  I first met her at the request of her primary care physician on 11/14/2021, at which time she reported snoring and excessive daytime somnolence as well as occasional morning headaches and a family history of sleep apnea.  She had a baseline sleep study through our sleep lab on 01/13/22, which showed moderate to severe obstructive sleep apnea, with a total AHI of 24.2/hour, REM AHI of 61/hour, supine AHI of 20.2/hour and O2 nadir of 74%.  She had a subsequent CPAP titration study on 03/03/2022, at which time she did well with CPAP of 11 cm via fullface mask.  She did not proceed with CPAP therapy at the time, due to cost.  She has had a job and insurance change since then.   Today, 09/21/2023: She reports significant snoring and daytime somnolence.  Her Epworth sleepiness score is 12 out of 24, fatigue severity score is 33 out of 63.  She has occasional morning headaches or headaches after waking up.  Her sister has sleep apnea and her dad had sleep apnea.  She works nights, typically 2 on, 2 of, 3 on, 2 off, 2 on, 3 off etc.  She works at the [employer].  She works as a Location manager.  She goes to bed somewhere between 8 AM and noon typically and rise time is around 5.  She lives with her 42 year old son, 58 year old daughter and her mom.  She drinks caffeine in the form of soda and tea, about 4 servings per day.  She has a history of tremor affecting her head and her voice and her dad had a tremor.  She would be willing to get treated for sleep apnea at this point.  She is working on weight loss and  compared to October 2023 she has lost over 10 pounds.  The patient's allergies, current medications, family history, past medical history, past social history, past surgical history and problem list were reviewed and updated as appropriate.   Previously:   11/14/2021: 58 year old right-handed woman with an underlying medical history of arthritis, hypertension, vitamin D  deficiency, overactive bladder, anxiety and severe obesity with a BMI of over 40, who reports snoring and excessive daytime somnolence as well as witnessed apneas and morning headaches.  I reviewed your office note from 09/30/2021.  Her Epworth sleepiness score is 11/24, fatigue severity score is 38 out of 63.  Her sister has noted apneic pauses while she is asleep, she lives with her mom, sister and son, she has a total of 2 grown children altogether.  Her sister has a sleep apnea diagnosis and uses a machine, her father also has a CPAP machine.  Patient drinks caffeine in the form of soda or tea, typically 1 20 ounce bottle of soda per day and occasional tea.  She is a non-smoker and does not drink alcohol.  She works in Theatre stage manager, has a sedentary job, goes to bed around 7 PM and has a rise time of 3:15 AM except for Fridays when she has to get up at 2 AM.  Her commute is about 15 to 20 minutes.  She has woken up occasionally with a dull, achy headache and sometimes like a migraine.  She has nocturia about 3-4 times per average night.   Her Past Medical History Is Significant For: Past Medical History:  Diagnosis Date   Anxiety    Arthritis 2014   CARPAL TUNNEL SYNDROME, BILATERAL 05/22/2010   Dental caries    Dyspnea on exertion 12/24/2021   Gout    History of bronchitis 10/25/2015   History of CT scan of head 07/2014   due to syncope, normal scan   History of echocardiogram 07/17/2014   TTE, normal LV function, 55-60% EF   History of EKG 07/2014   PVCs, otherwise normal   History of nuclear stress test 07/2014    normal   HYPERTENSION 10/25/2008   Impaired fasting blood sugar    Normal cardiac stress test 07/17/2014   Dr. Peter [place]   Overactive bladder    Right lateral epicondylitis 03/09/2017   Vitamin D  deficiency    Wears glasses     Her Past Surgical History Is Significant For: Past Surgical History:  Procedure Laterality Date   ABDOMINAL HYSTERECTOMY  09/2010   fibroid, partial hysterectomy    TOENAIL EXCISION Bilateral 2000's   big toes (12/23/2012)   WISDOM TOOTH EXTRACTION  ~ 1985    Her Family History Is Significant For: Family History  Problem Relation Age of Onset   Cancer Mother        breast cancer   Hypertension Father    Gout Father    Clotting disorder Father    Lung disease Father        listeria   Stroke Father    Arthritis Father    Heart disease Father    Migraines Sister    Hypertension Sister    Mental illness Sister    Sleep apnea Sister    Stroke Maternal Grandmother    Cancer Maternal Grandfather        lung cancer (smoking)   Heart disease Daughter        heart murmur   Diabetes Neg Hx     Her Social History Is Significant For: Social History   Socioeconomic History   Marital status: Single    Spouse name: Not on file   Number of children: 2   Years of education: Not on file   Highest education level: 12th grade  Occupational History   Occupation: Psychologist, educational: [employer]   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  Works in Theatre stage manager at [employer].   As of 10/2015   Pt lives with family    Pt works    Social Drivers of [employer]: Low Risk  (05/07/2023)   Overall Financial Resource Strain (CARDIA)    Difficulty of Paying Living Expenses: Not very hard  Food Insecurity: Food Insecurity Present (05/07/2023)   Hunger Vital Sign    Worried About Running Out of Food in the Last Year: Never true    Ran Out of Food in  the Last Year: Sometimes true  Transportation Needs: No Transportation Needs (05/07/2023)   PRAPARE - Administrator, Civil Service (Medical): No    Lack of Transportation (Non-Medical): No  Physical Activity: Unknown (05/07/2023)   Exercise Vital Sign    Days of Exercise per Week: 0 days    Minutes of Exercise per Session: Not on file  Recent Concern: Physical Activity - Inactive (05/07/2023)   Exercise Vital Sign    Days of Exercise per Week: 0 days    Minutes of Exercise per Session: 30 min  Stress: Stress Concern Present (05/07/2023)   [employer] of Occupational Health - Occupational Stress Questionnaire    Feeling of Stress : To some extent  Social Connections: Moderately Isolated (05/07/2023)   Social Connection and Isolation Panel    Frequency of Communication with Friends and Family: More than three times a week    Frequency of Social Gatherings with Friends and Family: Twice a week    Attends Religious Services: 1 to 4 times per year    Active Member of [employer] or Organizations: No    Attends Engineer, structural: Not on file    Marital Status: Never married    Her Allergies Are:  No Known Allergies:   Her Current Medications Are:  Outpatient Encounter Medications as of 09/21/2023  Medication Sig   bimatoprost (LUMIGAN) 0.01 % SOLN Place 1 drop into both eyes at bedtime.   brimonidine (ALPHAGAN) 0.2 % ophthalmic solution SMARTSIG:In Eye(s)   latanoprost (XALATAN) 0.005 % ophthalmic solution 1 drop at bedtime.   olmesartan -hydrochlorothiazide  (BENICAR  HCT) 40-25 MG tablet Take 1 tablet by mouth daily.   rosuvastatin  (CRESTOR ) 10 MG tablet  TAKE 1 TABLET BY MOUTH EVERY DAY   timolol (TIMOPTIC) 0.5 % ophthalmic solution SMARTSIG:In Eye(s)   tirzepatide  (MOUNJARO ) 5 MG/0.5ML Pen Inject 5 mg into the skin once a week.   famotidine  (PEPCID ) 20 MG tablet Take 1 tablet (20 mg total) by mouth 2 (two) times daily. For itching and rash.   loratadine  (CLARITIN ) 10 MG tablet Take 1 tablet (10 mg total) by mouth daily. For itching and rash.   oxybutynin (DITROPAN) 5 MG tablet Take 5 mg by mouth 2 (two) times daily. (Patient not taking: Reported on 09/21/2023)   No facility-administered encounter medications on file as of 09/21/2023.  :  Review of Systems:  Out of a complete 14 point review of systems, all are reviewed and negative with the exception of these symptoms as listed below:  Review of Systems  Neurological:        Pt here to restart cpap machine Pt snores,headaches,hypertension,fatigue Pt had sleep study 2024 Pt states unable to get cpap due to cost     Objective:  Neurological Exam  Physical Exam Physical Examination:   Vitals:   09/21/23 0936  BP: 119/76  Pulse: 76    General Examination: The patient is a very pleasant 58 y.o. female in no acute distress. She appears well-developed and well-nourished and well groomed.   HEENT: Normocephalic, atraumatic, pupils are equal, round and reactive to light, extraocular tracking is good without limitation to gaze excursion or nystagmus noted. Hearing is grossly intact. Face is symmetric with normal facial animation. Speech with mild voice tremor, at times almost like spasmodic dysphonia.  She has a intermittent head tremor side-to-side, very slight right head tilt and slight left head turn, overall findings are mild.  Airway examination reveals a minimal overbite, tonsillar size of 1-2+ with moderate  airway crowding, small airway entry and wider uvula noted.  Neck circumference of 15 1/4 in go through things as mom's as well and this is the first time we are going through it  ourselves and we are not perfect and we do the best we can and that we work hard and one of the best for our kids and we want them to be happy and stable and mature and successful and ches, Mallampati class III.    Chest: Clear to auscultation without wheezing, rhonchi or crackles noted.   Heart: S1+S2+0, regular and normal without murmurs, rubs or gallops noted.    Abdomen: Soft, non-tender and non-distended with normal bowel sounds appreciated on auscultation.   Extremities: There is trace pitting edema in the distal lower extremities bilaterally.    Skin: Warm and dry without trophic changes noted.    Musculoskeletal: exam reveals no obvious joint deformities, tenderness or joint swelling or erythema.    Neurologically:  Mental status: The patient is awake, alert and oriented in all 4 spheres. Her immediate and remote memory, attention, language skills and fund of knowledge are appropriate. There is no evidence of aphasia, agnosia, apraxia or anomia. Speech is clear with normal prosody and enunciation. Thought process is linear. Mood is normal and affect is normal.  Cranial nerves II - XII are as described above under HEENT exam.  Motor exam: Normal bulk, strength and tone is noted. There is no obvious hand tremor. Fine motor skills and coordination: grossly intact.  Cerebellar testing: No dysmetria or intention tremor. There is no truncal or gait ataxia.  Sensory exam: intact to light touch in the upper and lower extremities.  Gait, station and balance: She stands easily. No veering to one side is noted. No leaning to one side is noted. Posture is age-appropriate and stance is narrow based. Gait shows normal stride length and normal pace. No problems turning are noted.    Assessment and Plan:  In summary, ANDRIKA PERAZA is a very pleasant 58 year old female with an underlying medical history of arthritis, hypertension, vitamin D  deficiency, overactive bladder, anxiety and severe obesity  with a BMI of over 40, who presents for follow-up consultation of her obstructive sleep apnea.  She was diagnosed with moderate to severe obstructive sleep apnea back in October 2023.  She has been working on weight loss and has lost over 10 pounds since then.  We talked about the importance of treating obstructive sleep apnea especially when its moderate to severe with respect to preventing cardiovascular complications down the road.  She is willing to get evaluated and treated at this point.  We will proceed with a home sleep test at this time and plan to follow-up after testing.  If she has obstructive sleep apnea, particularly if it is still in the moderate range, we will proceed with home AutoPap therapy.  We talked about alternative treatment options including a mouth appliance or surgical options including inspire and mutually agreed that we will pursue PAP therapy first.  She is encouraged to continue to work on weight loss and healthy sleep habits, try to keep a set schedule for her sleep, and limit her caffeine.  I answered all her questions today and she was in agreement.  I spent 40 minutes in total face-to-face time and in reviewing records during pre-charting, more than 50% of which was spent in counseling and coordination of care, reviewing test results, reviewing medications and treatment regimen and/or in discussing or reviewing the diagnosis  of OSA, the prognosis and treatment options. Pertinent laboratory and imaging test results that were available during this visit with the patient were reviewed by me and considered in my medical decision making (see chart for details).

## 2023-09-22 MED ORDER — TIRZEPATIDE 5 MG/0.5ML ~~LOC~~ SOAJ: 5.0000 mg | SUBCUTANEOUS | 1 refills | Status: DC

## 2023-10-01 ENCOUNTER — Other Ambulatory Visit (HOSPITAL_COMMUNITY): Payer: Self-pay

## 2023-10-01 ENCOUNTER — Telehealth: Payer: Self-pay

## 2023-10-01 NOTE — Telephone Encounter (Signed)
 Pharmacy Patient Advocate Encounter   Received notification from CoverMyMeds that prior authorization for Mounjaro  5MG /0.5ML auto-injectors is required/requested.   Insurance verification completed.   The patient is insured through [employer] .   Per test claim: PA required and submitted KEY/EOC/Request #: [id number] from 09/01/23 to 09/30/24. Ran test claim, Copay is $25. This test claim was processed through [facility]- copay amounts may vary at other pharmacies due to pharmacy/plan contracts, or as the patient moves through the different stages of their insurance plan.

## 2023-10-02 NOTE — Telephone Encounter (Signed)
 Called patient and left a detailed voice message per DPR on file stating that the PA for her Mounjaro  5 mg/0.5 mL autoinjector pens were approved and that she may go to her pharmacy for pick. Also stated that if she has any questions to please give our office a call at [phone]

## 2023-10-06 ENCOUNTER — Telehealth: Payer: Self-pay

## 2023-10-06 DIAGNOSIS — E1169 Type 2 diabetes mellitus with other specified complication: Secondary | ICD-10-CM

## 2023-10-06 NOTE — Telephone Encounter (Signed)
 Copied from CRM [id number]. Topic: Clinical - Prescription Issue >> Oct 06, 2023 11:27 AM Kimberly Underwood wrote: Reason for CRM: Patient called in stating she was unable to pick up tirzepatide  (MOUNJARO ) 5 MG/0.5ML Pen. Please resend to [id number] - [place], [place] - [address]

## 2023-10-07 MED ORDER — TIRZEPATIDE 5 MG/0.5ML ~~LOC~~ SOAJ: 5.0000 mg | SUBCUTANEOUS | 5 refills | Status: DC

## 2023-10-07 NOTE — Telephone Encounter (Signed)
 Called patient's pharmacy and spoke with Isaiah. I informed her for my call. She stated that there is a note stating plan limitations from insurance. Patient's insurance will not cover a 3 month supply how Rx was sent over. Isaiah said that she changed it to 1 month to see if the insurance will cover and it did accept the 1 month. She stated that patient will get a 1 month supply instead of a 3 month supply and will have refills on file. I thanked her for taking my call and assisting in this matter. I informed her that I will call and inform patient of the information.

## 2023-10-07 NOTE — Telephone Encounter (Signed)
 Per patient's pharmacy future Rx refills will have to go in as a 1 month supply not a 3 month insurance will not cover a 3 month supply it will give plan limitations when sent as 3 month supply.

## 2023-10-07 NOTE — Addendum Note (Signed)
 Addended by: KATHEEN GARDEN L on: 10/07/2023 10:36 AM   Modules accepted: Orders

## 2023-10-07 NOTE — Telephone Encounter (Signed)
 Called and left a detailed voice message per DPR on file of the information I received from patient's pharmacy about her Mounjaro  pen. Asked to give me a call back at the office at [phone] if she has any questions

## 2023-10-13 DIAGNOSIS — J40 Bronchitis, not specified as acute or chronic: Secondary | ICD-10-CM | POA: Diagnosis not present

## 2023-10-13 DIAGNOSIS — R051 Acute cough: Secondary | ICD-10-CM | POA: Diagnosis not present

## 2023-10-13 DIAGNOSIS — Z20822 Contact with and (suspected) exposure to covid-19: Secondary | ICD-10-CM | POA: Diagnosis not present

## 2023-10-23 ENCOUNTER — Ambulatory Visit (INDEPENDENT_AMBULATORY_CARE_PROVIDER_SITE_OTHER): Admitting: Neurology

## 2023-10-23 DIAGNOSIS — G4726 Circadian rhythm sleep disorder, shift work type: Secondary | ICD-10-CM

## 2023-10-23 DIAGNOSIS — G4733 Obstructive sleep apnea (adult) (pediatric): Secondary | ICD-10-CM

## 2023-10-23 DIAGNOSIS — R634 Abnormal weight loss: Secondary | ICD-10-CM

## 2023-10-23 DIAGNOSIS — R351 Nocturia: Secondary | ICD-10-CM

## 2023-10-23 DIAGNOSIS — G4719 Other hypersomnia: Secondary | ICD-10-CM

## 2023-10-23 DIAGNOSIS — G4734 Idiopathic sleep related nonobstructive alveolar hypoventilation: Secondary | ICD-10-CM

## 2023-11-03 ENCOUNTER — Other Ambulatory Visit: Payer: Self-pay | Admitting: Cardiology

## 2023-11-04 ENCOUNTER — Encounter: Payer: Self-pay | Admitting: Nurse Practitioner

## 2023-11-04 ENCOUNTER — Ambulatory Visit (INDEPENDENT_AMBULATORY_CARE_PROVIDER_SITE_OTHER): Payer: BC Managed Care – PPO | Admitting: Nurse Practitioner

## 2023-11-04 ENCOUNTER — Telehealth: Payer: Self-pay

## 2023-11-04 ENCOUNTER — Ambulatory Visit: Payer: Self-pay | Admitting: Nurse Practitioner

## 2023-11-04 VITALS — BP 118/76 | HR 88 | Temp 97.6°F | Ht 67.0 in | Wt 251.0 lb

## 2023-11-04 DIAGNOSIS — N3281 Overactive bladder: Secondary | ICD-10-CM

## 2023-11-04 DIAGNOSIS — I10 Essential (primary) hypertension: Secondary | ICD-10-CM

## 2023-11-04 DIAGNOSIS — Z7985 Long-term (current) use of injectable non-insulin antidiabetic drugs: Secondary | ICD-10-CM

## 2023-11-04 DIAGNOSIS — E785 Hyperlipidemia, unspecified: Secondary | ICD-10-CM

## 2023-11-04 DIAGNOSIS — K5903 Drug induced constipation: Secondary | ICD-10-CM

## 2023-11-04 DIAGNOSIS — R350 Frequency of micturition: Secondary | ICD-10-CM | POA: Diagnosis not present

## 2023-11-04 DIAGNOSIS — E1169 Type 2 diabetes mellitus with other specified complication: Secondary | ICD-10-CM | POA: Diagnosis not present

## 2023-11-04 LAB — LIPID PANEL
Cholesterol: 112 mg/dL (ref 0–200)
HDL: 57.9 mg/dL (ref >39.00)
LDL Cholesterol: 43 mg/dL (ref 0–99)
NonHDL: 54.27
Total CHOL/HDL Ratio: 2
Triglycerides: 58 mg/dL (ref 0.0–149.0)
VLDL: 11.6 mg/dL (ref 0.0–40.0)

## 2023-11-04 LAB — COMPREHENSIVE METABOLIC PANEL WITH GFR
ALT: 8 U/L (ref 0–35)
AST: 14 U/L (ref 0–37)
Albumin: 4.4 g/dL (ref 3.5–5.2)
Alkaline Phosphatase: 72 U/L (ref 39–117)
BUN: 14 mg/dL (ref 6–23)
CO2: 32 meq/L (ref 19–32)
Calcium: 9.8 mg/dL (ref 8.4–10.5)
Chloride: 103 meq/L (ref 96–112)
Creatinine, Ser: 0.84 mg/dL (ref 0.40–1.20)
GFR: 76.59 mL/min (ref >60.00)
Glucose, Bld: 101 mg/dL — ABNORMAL HIGH (ref 70–99)
Potassium: 3.5 meq/L (ref 3.5–5.1)
Sodium: 142 meq/L (ref 135–145)
Total Bilirubin: 0.4 mg/dL (ref 0.2–1.2)
Total Protein: 7.9 g/dL (ref 6.0–8.3)

## 2023-11-04 LAB — MICROALBUMIN / CREATININE URINE RATIO
Creatinine,U: 202 mg/dL
Microalb Creat Ratio: 4.7 mg/g (ref 0.0–30.0)
Microalb, Ur: 1 mg/dL (ref 0.0–1.9)

## 2023-11-04 LAB — HEMOGLOBIN A1C: Hgb A1c MFr Bld: 6.8 % — ABNORMAL HIGH (ref 4.6–6.5)

## 2023-11-04 MED ORDER — POLYETHYLENE GLYCOL 3350 17 GM/SCOOP PO POWD: 17.0000 g | Freq: Every day | ORAL | Status: AC

## 2023-11-04 NOTE — Assessment & Plan Note (Signed)
 Current use of crestor  with no advised side effects Repeat lipid panel-LDL at goal Maintain med dose

## 2023-11-04 NOTE — Assessment & Plan Note (Signed)
 Increased urinary frequency and urgency, with occassional stress incontinence. No dysuria or hematuria Admits to large amounts of sweet tea and caffeine soda daily.  Check UA today Advised to stop sugary and caffeine drinks

## 2023-11-04 NOTE — Telephone Encounter (Signed)
 Called and left a detailed voice message per DPR on file for patient. Stated that the letter she discussed with Roselie at today's appointment for work is ready for her to pick up. Letter placed in folder at front office.

## 2023-11-04 NOTE — Progress Notes (Signed)
 Established Patient Visit  Patient: Kimberly Underwood   DOB: [birth year]   58 y.o. Female  MRN: [id number] Visit Date: 11/04/2023  Subjective:    Chief Complaint  Patient presents with   Follow-up    FASTING 6 month follow up    HPI Overactive bladder Increased urinary frequency and urgency, with occassional stress incontinence. No dysuria or hematuria Admits to large amounts of sweet tea and caffeine soda daily.  Check UA today Advised to stop sugary and caffeine drinks  DM (diabetes mellitus) (HCC) Reports constipation with mounjaro  5mg  UTD with DIABETES eye exam with [facility]: no retinopathy, glaucoma under control No neuropathy and nephropathy LDL at goal with crestor   Repeat hgbA1c and CMP-stable  Maintain med dose Advised to add miralax  17g daily, adequate oral hydration and high fiber diet F/up in 3months  Hyperlipidemia associated with type 2 diabetes mellitus (HCC) Current use of crestor  with no advised side effects Repeat lipid panel-LDL at goal Maintain med dose   Wt Readings from Last 3 Encounters:  11/04/23 251 lb (113.9 kg)  09/21/23 256 lb (116.1 kg)  05/26/23 260 lb (117.9 kg)    BP Readings from Last 3 Encounters:  11/04/23 118/76  09/21/23 119/76  05/26/23 138/79    Reviewed medical, surgical, and social history today  Medications: Outpatient Medications Prior to Visit  Medication Sig   bimatoprost (LUMIGAN) 0.01 % SOLN Place 1 drop into both eyes at bedtime.   brimonidine (ALPHAGAN) 0.2 % ophthalmic solution SMARTSIG:In Eye(s)   latanoprost (XALATAN) 0.005 % ophthalmic solution 1 drop at bedtime.   olmesartan -hydrochlorothiazide  (BENICAR  HCT) 40-25 MG tablet Take 1 tablet by mouth daily.   rosuvastatin  (CRESTOR ) 10 MG tablet TAKE 1 TABLET BY MOUTH EVERY DAY   timolol (TIMOPTIC) 0.5 % ophthalmic solution SMARTSIG:In Eye(s)   tirzepatide  (MOUNJARO ) 5 MG/0.5ML Pen Inject 5 mg into the skin once a week.   [DISCONTINUED]  famotidine  (PEPCID ) 20 MG tablet Take 1 tablet (20 mg total) by mouth 2 (two) times daily. For itching and rash. (Patient not taking: Reported on 11/04/2023)   [DISCONTINUED] loratadine  (CLARITIN ) 10 MG tablet Take 1 tablet (10 mg total) by mouth daily. For itching and rash. (Patient not taking: Reported on 11/04/2023)   [DISCONTINUED] oxybutynin (DITROPAN) 5 MG tablet Take 5 mg by mouth 2 (two) times daily. (Patient not taking: Reported on 11/04/2023)   No facility-administered medications prior to visit.   Reviewed past medical and social history.   ROS per HPI above      Objective:  BP 118/76 (BP Location: Left Arm, Patient Position: Sitting, Cuff Size: Large)   Pulse 88   Temp 97.6 F (36.4 C) (Oral)   Ht 5' 7 (1.702 m)   Wt 251 lb (113.9 kg)   SpO2 97%   BMI 39.31 kg/m      Physical Exam  Results for orders placed or performed in visit on 11/04/23  Hemoglobin A1c  Result Value Ref Range   Hgb A1c MFr Bld 6.8 (H) 4.6 - 6.5 %  Microalbumin / creatinine urine ratio  Result Value Ref Range   Microalb, Ur 1.0 0.0 - 1.9 mg/dL   Creatinine,U 797.9 mg/dL   Microalb Creat Ratio 4.7 0.0 - 30.0 mg/g  Comprehensive metabolic panel with GFR  Result Value Ref Range   Sodium 142 135 - 145 mEq/L   Potassium 3.5 3.5 - 5.1 mEq/L   Chloride 103  96 - 112 mEq/L   CO2 32 19 - 32 mEq/L   Glucose, Bld 101 (H) 70 - 99 mg/dL   BUN 14 6 - 23 mg/dL   Creatinine, Ser 9.15 0.40 - 1.20 mg/dL   Total Bilirubin 0.4 0.2 - 1.2 mg/dL   Alkaline Phosphatase 72 39 - 117 U/L   AST 14 0 - 37 U/L   ALT 8 0 - 35 U/L   Total Protein 7.9 6.0 - 8.3 g/dL   Albumin 4.4 3.5 - 5.2 g/dL   GFR 23.40 >39.99 mL/min   Calcium  9.8 8.4 - 10.5 mg/dL  Lipid panel  Result Value Ref Range   Cholesterol 112 0 - 200 mg/dL   Triglycerides 41.9 0.0 - 149.0 mg/dL   HDL 42.09 >60.99 mg/dL   VLDL 88.3 0.0 - 59.9 mg/dL   LDL Cholesterol 43 0 - 99 mg/dL   Total CHOL/HDL Ratio 2    NonHDL 54.27       Assessment & Plan:     Problem List Items Addressed This Visit     DM (diabetes mellitus) (HCC)   Reports constipation with mounjaro  5mg  UTD with DIABETES eye exam with [facility]: no retinopathy, glaucoma under control No neuropathy and nephropathy LDL at goal with crestor   Repeat hgbA1c and CMP-stable  Maintain med dose Advised to add miralax  17g daily, adequate oral hydration and high fiber diet F/up in 3months      Relevant Orders   Hemoglobin A1c (Completed)   Microalbumin / creatinine urine ratio (Completed)   Comprehensive metabolic panel with GFR (Completed)   Hyperlipidemia associated with type 2 diabetes mellitus (HCC)   Current use of crestor  with no advised side effects Repeat lipid panel-LDL at goal Maintain med dose       Relevant Orders   Comprehensive metabolic panel with GFR (Completed)   Lipid panel (Completed)   Hypertensive disorder - Primary   Relevant Orders   Comprehensive metabolic panel with GFR (Completed)   Overactive bladder   Increased urinary frequency and urgency, with occassional stress incontinence. No dysuria or hematuria Admits to large amounts of sweet tea and caffeine soda daily.  Check UA today Advised to stop sugary and caffeine drinks      Other Visit Diagnoses       Urinary frequency       Relevant Orders   Urinalysis w microscopic + reflex cultur     Drug-induced constipation       Relevant Medications   polyethylene glycol powder (GLYCOLAX /MIRALAX ) 17 GM/SCOOP powder      Return in about 3 months (around 02/04/2024) for HTN, DM, hyperlipidemia (fasting).     Roselie Mood, NP

## 2023-11-04 NOTE — Patient Instructions (Signed)
 Go to lab Maintain Heart healthy diet and daily exercise. Maintain current medications. Add miralax  daily for constipation Stop caffeine and sugary drinks Switch to decaf unsweetened tea.

## 2023-11-04 NOTE — Assessment & Plan Note (Addendum)
 Reports constipation with mounjaro  5mg  UTD with DIABETES eye exam with [facility]: no retinopathy, glaucoma under control No neuropathy and nephropathy LDL at goal with crestor   Repeat hgbA1c and CMP-stable  Maintain med dose Advised to add miralax  17g daily, adequate oral hydration and high fiber diet F/up in 3months

## 2023-11-05 LAB — URINALYSIS W MICROSCOPIC + REFLEX CULTURE
Bacteria, UA: NONE SEEN /HPF
Bilirubin Urine: NEGATIVE
Glucose, UA: NEGATIVE
Hgb urine dipstick: NEGATIVE
Hyaline Cast: NONE SEEN /LPF
Ketones, ur: NEGATIVE
Leukocyte Esterase: NEGATIVE
Nitrites, Initial: NEGATIVE
Protein, ur: NEGATIVE
RBC / HPF: NONE SEEN /HPF (ref 0–2)
Specific Gravity, Urine: 1.028 (ref 1.001–1.035)
WBC, UA: NONE SEEN /HPF (ref 0–5)
pH: <=5 — AB (ref 5.0–8.0)

## 2023-11-05 LAB — NO CULTURE INDICATED

## 2023-11-05 NOTE — Progress Notes (Signed)
 See procedure note.

## 2023-11-09 ENCOUNTER — Ambulatory Visit: Payer: Self-pay | Admitting: Neurology

## 2023-11-09 DIAGNOSIS — G4733 Obstructive sleep apnea (adult) (pediatric): Secondary | ICD-10-CM

## 2023-11-09 DIAGNOSIS — G4734 Idiopathic sleep related nonobstructive alveolar hypoventilation: Secondary | ICD-10-CM

## 2023-11-09 NOTE — Procedures (Signed)
 [facility]  HOME SLEEP TEST (SANSA) REPORT (Mail-Out Device):   STUDY DATE: 10/25/23  DOB: Jan 01, 1966  MRN: [id number]  ORDERING CLINICIAN: True Mar, MD, PhD   REFERRING CLINICIAN: Nche, Roselie Rockford, NP   CLINICAL INFORMATION/HISTORY: 58 year old female with an underlying medical history of arthritis, hypertension, vitamin D  deficiency, overactive bladder, anxiety and severe obesity with a BMI of over 40, who presents for re-evaluation of her obstructive sleep apnea. She was diagnosed with moderate to severe obstructive sleep apnea in 2023, but did not proceed with PAP therapy at the time  PATIENT'S LAST REPORTED EPWORTH SLEEPINESS SCORE (ESS): 12/24.  BMI (at the time of sleep clinic visit and/or test date): 40.1 kg/m  FINDINGS:   Study Protocol:    The SANSA single-point-of-skin-contact chest-worn sensor - an FDA cleared and DOT approved type 4 home sleep test device - measures eight physiological channels,  including blood oxygen saturation (measured via PPG [photoplethysmography]), EKG-derived heart rate, respiratory effort, chest movement (measured via accelerometer), snoring, body position, and actigraphy. The device is designed to be worn for up to 10 hours per study.   Sleep Summary:   Total Recording Time (hours, min): 9 hours, 53 min  Total Effective Sleep Time (hours, min):  4 hours, 25 min  Sleep Efficiency (%):    78%   Respiratory Indices:   Calculated sAHI (per hour):  33.9/hour         Oxygen Saturation Statistics:    Oxygen Saturation (%) Mean: 91.2%   Minimum oxygen saturation (%):                 69.3%   O2 Saturation Range (%): 69.3-99.2%   Time below or at 88% saturation: 60 min   Pulse Rate Statistics:   Pulse Mean (bpm):    88/min    Pulse Range (68-123/min)   Snoring: Mild to moderate  IMPRESSION/DIAGNOSES:   OSA (obstructive sleep apnea), severe Nocturnal Hypoxemia  RECOMMENDATIONS:   This home sleep  test demonstrates severe obstructive sleep apnea with a total AHI of 33.9/hour and O2 nadir of 69.3% with significant time below or at 88% saturation of 60 minutes for the study, indicating nocturnal hypoxemia. Snoring was detected, in the mild to moderate range.  Treatment with positive airway pressure is highly recommended. The patient will be advised to proceed with an autoPAP titration/trial at home. A laboratory attended titration study can be considered in the future for optimization of treatment settings and to improve tolerance and compliance, if needed, down the road. Alternative treatment options are limited secondary to the severity of the patient's sleep disordered breathing, but may include surgical treatment with an implantable hypoglossal nerve stimulator (in carefully selected candidates, meeting criteria).  Concomitant weight loss is recommended (where clinically appropriate). Please note, that untreated obstructive sleep apnea may carry additional perioperative morbidity. Patients with significant obstructive sleep apnea should receive perioperative PAP therapy and the surgeons and particularly the anesthesiologist should be informed of the diagnosis and the severity of the sleep disordered breathing. The patient should be cautioned not to drive, work at heights, or operate dangerous or heavy equipment when tired or sleepy. Review and reiteration of good sleep hygiene measures should be pursued with any patient. Other causes of the patient's symptoms, including circadian rhythm disturbances, an underlying mood disorder, medication effect and/or an underlying medical problem cannot be ruled out based on this test. Clinical correlation is recommended.  The patient and her referring provider will be notified of the test  results. The patient will be seen in follow up in sleep clinic at [facility].  I certify that I have reviewed the raw data recording prior to the issuance of this report in accordance  with the standards of the American Academy of Sleep Medicine (AASM).    INTERPRETING PHYSICIAN:   True Mar, MD, PhD Medical Director, [facility]) Diplomat, ABPN (Neurology and Sleep)   [facility] [address], [address] [place], [place] [address] [phone]

## 2023-11-10 NOTE — Telephone Encounter (Signed)
-----   Message from True Mar sent at 11/09/2023  4:34 PM EDT ----- Urgent set up requested on PAP therapy, due to severe OSA. Patient referred was last seen by me on 09/21/2023 for reevaluation of her OSA, currently not on PAP therapy.  The patient had a HST on 10/25/2023.    Please call and notify the patient that the recent home sleep test showed obstructive sleep apnea in the severe range. I recommend treatment for this in the form of autoPAP, which means, that we  don't have to bring her in for a sleep study with CPAP, but will let her start using a so called autoPAP machine at home, through a DME company (of her choice, or as per insurance requirement). The  DME representative will fit the patient with a mask of choice, educate her on how to use the machine, how to put the mask on, etc. I have placed an order in the chart. Please send the order to a  local DME, talk to patient, send report to referring MD. Please also reinforce the need for compliance with treatment. We will need a FU in sleep clinic for 10 weeks post-PAP set up, please arrange  that with me or one of our NPs. Thanks,   True Mar, MD, PhD [facility])    ----- Message ----- From: Mar True, MD Sent: 11/09/2023   4:33 PM EDT To: True Mar, MD

## 2023-11-10 NOTE — Telephone Encounter (Signed)
 Spoke to patient gave sleep study results Pt aware urgent set up due to severe OSA Pt chose [facility] has DME gave [facility] # Made f/u appointment with Sarah,NP 01/2024 Pt aware of insurance compliance Pt expressed understanding and thanked me for calling. Forward orders to [facility] this afternoon

## 2023-11-22 ENCOUNTER — Other Ambulatory Visit: Payer: Self-pay | Admitting: Nurse Practitioner

## 2023-12-03 DIAGNOSIS — G4733 Obstructive sleep apnea (adult) (pediatric): Secondary | ICD-10-CM | POA: Diagnosis not present

## 2023-12-25 ENCOUNTER — Other Ambulatory Visit: Payer: Self-pay | Admitting: Nurse Practitioner

## 2023-12-25 MED ORDER — OLMESARTAN MEDOXOMIL-HCTZ 40-25 MG PO TABS: 1.0000 | ORAL_TABLET | Freq: Every day | ORAL | 8 refills | Status: AC

## 2023-12-25 NOTE — Telephone Encounter (Signed)
 Copied from CRM #[id number]. Topic: Clinical - Medication Refill >> Dec 25, 2023  3:44 PM Suzen RAMAN wrote: Medication: olmesartan -hydrochlorothiazide  (BENICAR  HCT) 40-25 MG tablet   Has the patient contacted their pharmacy? Yes   This is the patient's preferred pharmacy:  [id number] - [place], [place] - [address] AT [facility] [address] [place] [address] Phone: [phone] Fax: [phone]  Is this the correct pharmacy for this prescription? Yes If no, delete pharmacy and type the correct one.   Has the prescription been filled recently? No  Is the patient out of the medication? Yes  Has the patient been seen for an appointment in the last year OR does the patient have an upcoming appointment? Yes  Can we respond through MyChart? Yes  Agent: Please be advised that Rx refills may take up to 3 business days. We ask that you follow-up with your pharmacy.

## 2024-01-03 DIAGNOSIS — G4733 Obstructive sleep apnea (adult) (pediatric): Secondary | ICD-10-CM | POA: Diagnosis not present

## 2024-01-12 ENCOUNTER — Encounter: Payer: Self-pay | Admitting: Neurology

## 2024-01-12 ENCOUNTER — Ambulatory Visit (INDEPENDENT_AMBULATORY_CARE_PROVIDER_SITE_OTHER): Payer: Self-pay | Admitting: Neurology

## 2024-01-12 VITALS — BP 123/82 | HR 86 | Ht 67.0 in | Wt 253.5 lb

## 2024-01-12 DIAGNOSIS — G4733 Obstructive sleep apnea (adult) (pediatric): Secondary | ICD-10-CM

## 2024-01-12 NOTE — Progress Notes (Addendum)
 Patient: Kimberly Underwood Date of Birth: 1965/07/26  Reason for Visit: Follow up History from: Patient Primary Neurologist: Kimberly Underwood  ASSESSMENT AND PLAN 58 y.o. year old female   1.  Severe OSA on CPAP  -Recommend nightly usage minimum 4 hours.  Recommend tightening her mask, ordering a new mask.  Her mask is leaking some.  Continue current settings.  Has noted significant benefit since starting CPAP. Follow up in 6 months virtually for CPAP revisit  -Setup 12/03/23  -HST 10/25/23 severe OSA  HISTORY OF PRESENT ILLNESS: Today 01/12/24 Had HST 10/25/2023 showing severe OSA. Download shows 80% compliance, > 4 hours 37%, 7-13 cm water, using FFM, leak 29, AHI 0.3. Mentions mask is leaking, gets frustrated at night, will take it off. When the mask isn't leaking, sleeps very well, sleeps through the night, feels better during the day, more energy, feels rested. CPAP setup 12/03/23. No health issues. 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   HISTORY  Kimberly Underwood is a 58 year old female with an underlying medical history of arthritis, hypertension, vitamin D  deficiency, overactive bladder, anxiety and severe obesity with a BMI of over 40, who presents for follow-up consultation of her obstructive sleep apnea.  The patient is unaccompanied today.  I first met her at the request of her primary care physician on 11/14/2021, at which time she reported snoring and excessive daytime somnolence as well as occasional morning headaches and a family history of sleep apnea.   She had a baseline sleep study through our sleep lab on 01/13/22, which showed moderate to severe obstructive sleep apnea, with a total AHI of 24.2/hour, REM AHI of 61/hour, supine AHI of 20.2/hour and O2 nadir of 74%.  She had a subsequent CPAP titration study on 03/03/2022, at which time she did well with CPAP of 11 cm via fullface mask.   She did not proceed with CPAP therapy at the time, due to cost.  She has had a job and insurance change  since then.     Today, 09/21/2023: She reports significant snoring and daytime somnolence.  Her Epworth sleepiness score is 12 out of 24, fatigue severity score is 33 out of 63.  She has occasional morning headaches or headaches after waking up.  Her sister has sleep apnea and her dad had sleep apnea.  She works nights, typically 2 on, 2 of, 3 on, 2 off, 2 on, 3 off etc.  She works at the [employer].  She works as a Location manager.  She goes to bed somewhere between 8 AM and noon typically and rise time is around 5.  She lives with her 57 year old son, 69 year old daughter and her mom.  She drinks caffeine in the form of soda and tea, about 4 servings per day.  She has a history of tremor affecting her head and her voice and her dad had a tremor.  She would be willing to get treated for sleep apnea at this point.  She is working on weight loss and compared to October 2023 she has lost over 10 pounds.  REVIEW OF SYSTEMS: Out of a complete 14 system review of symptoms, the patient complains only of the following symptoms, and all other reviewed systems are negative.  See HPI  ALLERGIES: No Known Allergies  HOME MEDICATIONS: Outpatient Medications Prior to Visit  Medication Sig Dispense Refill   bimatoprost (LUMIGAN) 0.01 % SOLN Place 1 drop into both eyes at bedtime.     brimonidine (ALPHAGAN) 0.2 % ophthalmic  solution SMARTSIG:In Eye(s)     latanoprost (XALATAN) 0.005 % ophthalmic solution 1 drop at bedtime.     olmesartan -hydrochlorothiazide  (BENICAR  HCT) 40-25 MG tablet Take 1 tablet by mouth daily. 30 tablet 8   polyethylene glycol powder (GLYCOLAX /MIRALAX ) 17 GM/SCOOP powder Take 17 g by mouth daily.     rosuvastatin  (CRESTOR ) 10 MG tablet TAKE 1 TABLET BY MOUTH EVERY DAY 90 tablet 3   timolol (TIMOPTIC) 0.5 % ophthalmic solution SMARTSIG:In Eye(s)     tirzepatide  (MOUNJARO ) 5 MG/0.5ML Pen Inject 5 mg into the skin once a week. 2 mL 5   No facility-administered medications prior  to visit.    PAST MEDICAL HISTORY: Past Medical History:  Diagnosis Date   Anxiety    Arthritis 2014   CARPAL TUNNEL SYNDROME, BILATERAL 05/22/2010   Dental caries    Dyspnea on exertion 12/24/2021   Gout    History of bronchitis 10/25/2015   History of CT scan of head 07/2014   due to syncope, normal scan   History of echocardiogram 07/17/2014   TTE, normal LV function, 55-60% EF   History of EKG 07/2014   PVCs, otherwise normal   History of nuclear stress test 07/2014   normal   HYPERTENSION 10/25/2008   Impaired fasting blood sugar    Normal cardiac stress test 07/17/2014   Dr. Peter Underwood   Overactive bladder    Right lateral epicondylitis 03/09/2017   Vitamin D  deficiency    Wears glasses     PAST SURGICAL HISTORY: Past Surgical History:  Procedure Laterality Date   ABDOMINAL HYSTERECTOMY  09/2010   fibroid, partial hysterectomy    TOENAIL EXCISION Bilateral 2000's   big toes (12/23/2012)   WISDOM TOOTH EXTRACTION  ~ 1985    FAMILY HISTORY: Family History  Problem Relation Age of Onset   Cancer Mother        breast cancer   Hypertension Father    Gout Father    Clotting disorder Father    Lung disease Father        listeria   Stroke Father    Arthritis Father    Heart disease Father    Migraines Sister    Hypertension Sister    Mental illness Sister    Sleep apnea Sister    Stroke Maternal Grandmother    Cancer Maternal Grandfather        lung cancer (smoking)   Heart disease Daughter        heart murmur   Diabetes Neg Hx     SOCIAL HISTORY: Social History   Socioeconomic History   Marital status: Single    Spouse name: Not on file   Number of children: 2   Years of education: Not on file   Highest education level: 12th grade  Occupational History   Occupation: Psychologist, educational: [employer]   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  Works in Theatre stage manager at [employer].   As of 10/2015   Pt lives with family    Pt works  Social Drivers of Corporate investment banker Strain: Low Risk  (05/07/2023)   Overall Financial Resource Strain (CARDIA)    Difficulty of Paying Living Expenses: Not very hard  Food Insecurity: Food Insecurity Present (05/07/2023)   Hunger Vital Sign    Worried About Running Out of Food in the Last Year: Never true    Ran Out of Food in the Last Year: Sometimes true  Transportation Needs: No Transportation Needs (05/07/2023)   PRAPARE - Administrator, Civil Service (Medical): No    Lack of Transportation (Non-Medical): No  Physical Activity: Unknown (05/07/2023)   Exercise Vital Sign    Days of Exercise per Week: 0 days    Minutes of Exercise per Session: Not on file  Recent Concern: Physical Activity - Inactive (05/07/2023)   Exercise Vital Sign    Days of Exercise per Week: 0 days    Minutes of Exercise per Session: 30 min  Stress: Stress Concern Present (05/07/2023)   [employer] of Occupational Health - Occupational Stress Questionnaire    Feeling of Stress : To some extent  Social Connections: Moderately Isolated (05/07/2023)   Social Connection and Isolation Panel    Frequency of Communication with Friends and Family: More than three times a week    Frequency of Social Gatherings with Friends and Family: Twice a week    Attends Religious Services: 1 to 4 times per year    Active Member of [employer] or Organizations: No    Attends Banker Meetings: Not on file    Marital Status: Never married  Intimate Partner Violence: Not At Risk (07/14/2022)   Humiliation,  Afraid, Rape, and Kick questionnaire    Fear of Current or Ex-Partner: No    Emotionally Abused: No    Physically Abused: No    Sexually Abused: No    PHYSICAL EXAM  Vitals:   01/12/24 0957  BP: 123/82  Pulse: 86  SpO2: 96%  Weight: 253 lb 8 oz (115 kg)  Height: 5' 7 (1.702 m)   Body mass index is 39.7 kg/m.  Generalized: Well developed, in no acute distress  Neurological examination  Mentation: Alert oriented to time, place, history taking. Follows all commands speech and language fluent Cranial nerve II-XII: Pupils were equal round reactive to light. Extraocular movements were full, visual field were full on confrontational test. Facial sensation and strength were normal.. Head turning and shoulder shrug  were normal and symmetric. Motor: The motor testing reveals 5 over 5 strength of all 4 extremities. Good symmetric motor tone is noted throughout.  Sensory: Sensory testing is intact to soft touch on all 4 extremities. No evidence of extinction is noted.  Coordination: Cerebellar testing reveals good finger-nose-finger and heel-to-shin bilaterally.  Gait and station: Gait is normal. Reflexes: Deep tendon reflexes are symmetric and normal bilaterally.   DIAGNOSTIC DATA (LABS, IMAGING, TESTING) - I reviewed patient records, labs, notes, testing and imaging myself where available.  Lab Results  Component Value Date   WBC 8.0 05/04/2022   HGB 12.5 05/04/2022   HCT 37.6 05/04/2022   MCV 86.2 05/04/2022   PLT 369 05/04/2022      Component Value Date/Time   NA 142 11/04/2023 0935   NA 140 06/09/2022 1540   K 3.5 11/04/2023 0935   CL 103 11/04/2023 0935   CO2 32 11/04/2023 0935   GLUCOSE 101 (H) 11/04/2023 0935   BUN 14 11/04/2023 0935   BUN 13 06/09/2022 1540   CREATININE  0.84 11/04/2023 0935   CREATININE 0.84 07/23/2018 1536   CALCIUM  9.8 11/04/2023 0935   PROT 7.9 11/04/2023 0935   PROT 6.9 02/17/2023 1504   ALBUMIN 4.4 11/04/2023 0935   ALBUMIN 4.1 02/17/2023  1504   AST 14 11/04/2023 0935   ALT 8 11/04/2023 0935   ALKPHOS 72 11/04/2023 0935   BILITOT 0.4 11/04/2023 0935   BILITOT 0.2 02/17/2023 1504   GFRNONAA >60 05/04/2022 1603   GFRAA >90 07/17/2014 0850   Lab Results  Component Value Date   CHOL 112 11/04/2023   HDL 57.90 11/04/2023   LDLCALC 43 11/04/2023   TRIG 58.0 11/04/2023   CHOLHDL 2 11/04/2023   Lab Results  Component Value Date   HGBA1C 6.8 (H) 11/04/2023   No results found for: [id number] Lab Results  Component Value Date   TSH 0.81 05/03/2021    Lauraine Born, AGNP-C, DNP 01/12/2024, 11:25 AM [facility] [address] [place], [place] [address] [phone]

## 2024-01-12 NOTE — Patient Instructions (Signed)
 Great to see you today! Continue CPAP usage minimum 4 hours nightly Continue current settings Continue to replace supplies routinely through DME Follow-up in 6 months or sooner if needed. Thanks!!

## 2024-02-04 ENCOUNTER — Ambulatory Visit: Admitting: Nurse Practitioner

## 2024-02-09 DIAGNOSIS — M542 Cervicalgia: Secondary | ICD-10-CM | POA: Diagnosis not present

## 2024-02-09 DIAGNOSIS — M545 Low back pain, unspecified: Secondary | ICD-10-CM | POA: Diagnosis not present

## 2024-02-19 NOTE — Progress Notes (Signed)
 Kimberly Underwood                                          MRN: [id number]   02/19/2024   The VBCI Quality Team Specialist reviewed this patient medical record for the purposes of chart review for care gap closure. The following were reviewed: abstraction for care gap closure-kidney health evaluation for diabetes:eGFR  and uACR.    VBCI Quality Team

## 2024-02-22 ENCOUNTER — Ambulatory Visit: Payer: Self-pay

## 2024-02-22 NOTE — Telephone Encounter (Signed)
 Noted schedule 02/23/24. Dm/cma
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
## 2024-02-23 ENCOUNTER — Ambulatory Visit: Admitting: Nurse Practitioner

## 2024-02-23 ENCOUNTER — Encounter: Payer: Self-pay | Admitting: Nurse Practitioner

## 2024-02-23 DIAGNOSIS — S46812A Strain of other muscles, fascia and tendons at shoulder and upper arm level, left arm, initial encounter: Secondary | ICD-10-CM

## 2024-02-23 DIAGNOSIS — Z7985 Long-term (current) use of injectable non-insulin antidiabetic drugs: Secondary | ICD-10-CM | POA: Diagnosis not present

## 2024-02-23 DIAGNOSIS — M25512 Pain in left shoulder: Secondary | ICD-10-CM | POA: Diagnosis not present

## 2024-02-23 DIAGNOSIS — E1169 Type 2 diabetes mellitus with other specified complication: Secondary | ICD-10-CM | POA: Diagnosis not present

## 2024-02-23 LAB — POCT GLYCOSYLATED HEMOGLOBIN (HGB A1C): Hemoglobin A1C: 6.1 % — AB (ref 4.0–5.6)

## 2024-02-23 MED ORDER — NAPROXEN 500 MG PO TABS: 500.0000 mg | ORAL_TABLET | Freq: Two times a day (BID) | ORAL | 0 refills | Status: DC

## 2024-02-23 MED ORDER — TIRZEPATIDE 5 MG/0.5ML ~~LOC~~ SOAJ: 5.0000 mg | SUBCUTANEOUS | 1 refills | Status: DC

## 2024-02-23 MED ORDER — KETOROLAC TROMETHAMINE 30 MG/ML IJ SOLN
30.0000 mg | Freq: Once | INTRAMUSCULAR | Status: AC
  Administered 2024-02-23: 30 mg via INTRAMUSCULAR

## 2024-02-23 NOTE — Patient Instructions (Signed)
 Start naproxen  and flexeril  tonight. Take with food Do not take flexeril  and drive due to risk of drowsiness Start home exercise while waiting for PT appointment. Apply warm compress after exercise Ok to also use 4% lidocaine patch OTC  Cervical Strain and Sprain Rehab Ask your health care provider which exercises are safe for you. Do exercises exactly as told by your health care provider and adjust them as directed. It is normal to feel mild stretching, pulling, tightness, or discomfort as you do these exercises. Stop right away if you feel sudden pain or your pain gets worse. Do not begin these exercises until told by your health care provider. Stretching and range-of-motion exercises Cervical side bending  Using good posture, sit on a stable chair or stand up. Without moving your shoulders, slowly tilt your left / right ear to your shoulder until you feel a stretch in the neck muscles on the opposite side. You should be looking straight ahead. Hold for __________ seconds. Repeat with the other side of your neck. Repeat __________ times. Complete this exercise __________ times a day. Cervical rotation  Using good posture, sit on a stable chair or stand up. Slowly turn your head to the side as if you are looking over your left / right shoulder. Keep your eyes level with the ground. Stop when you feel a stretch along the side and the back of your neck. Hold for __________ seconds. Repeat this by turning to your other side. Repeat __________ times. Complete this exercise __________ times a day. Thoracic extension and pectoral stretch  Roll a towel or a small blanket so it is about 4 inches (10 cm) in diameter. Lie down on your back on a firm surface. Put the towel in the middle of your back across your spine. It should not be under your shoulder blades. Put your hands behind your head and let your elbows fall out to your sides. Hold for __________ seconds. Repeat __________ times.  Complete this exercise __________ times a day. Strengthening exercises Upper cervical flexion  Lie on your back with a thin pillow behind your head or a small, rolled-up towel under your neck. Gently tuck your chin toward your chest and nod your head down to look toward your feet. Do not lift your head off the pillow. Hold for __________ seconds. Release the tension slowly. Relax your neck muscles completely before you repeat this exercise. Repeat __________ times. Complete this exercise __________ times a day. Cervical extension  Stand about 6 inches (15 cm) away from a wall, with your back facing the wall. Place a soft object, about 6-8 inches (15-20 cm) in diameter, between the back of your head and the wall. A soft object could be a small pillow, a ball, or a folded towel. Gently tilt your head back and press into the soft object. Keep your jaw and forehead relaxed. Hold for __________ seconds. Release the tension slowly. Relax your neck muscles completely before you repeat this exercise. Repeat __________ times. Complete this exercise __________ times a day. Posture and body mechanics Body mechanics refer to the movements and positions of your body while you do your daily activities. Posture is part of body mechanics. Good posture and healthy body mechanics can help to relieve stress in your body's tissues and joints. Good posture means that your spine is in its natural S-curve position (your spine is neutral), your shoulders are pulled back slightly, and your head is not tipped forward. The following are general guidelines for  using improved posture and body mechanics in your everyday activities. Sitting  When sitting, keep your spine neutral and keep your feet flat on the floor. Use a footrest, if needed, and keep your thighs parallel to the floor. Avoid rounding your shoulders. Avoid tilting your head forward. When working at a desk or a computer, keep your desk at a height where your  hands are slightly lower than your elbows. Slide your chair under your desk so you are close enough to maintain good posture. When working at a computer, place your monitor at a height where you are looking straight ahead and you do not have to tilt your head forward or downward to look at the screen. Standing  When standing, keep your spine neutral and keep your feet about hip-width apart. Keep a slight bend in your knees. Your ears, shoulders, and hips should line up. When you do a task in which you stand in one place for a long time, place one foot up on a stable object that is 2-4 inches (5-10 cm) high, such as a footstool. This helps keep your spine neutral. Resting When lying down and resting, avoid positions that are most painful for you. Try to support your neck in a neutral position. You can use a contour pillow or a small rolled-up towel. Your pillow should support your neck but not push on it. This information is not intended to replace advice given to you by your health care provider. Make sure you discuss any questions you have with your health care provider. Document Revised: 07/28/2022 Document Reviewed: 10/14/2021 Elsevier Patient Education  2024 [employer].

## 2024-02-23 NOTE — Assessment & Plan Note (Signed)
 Repeat hgbA1c at 6.1% improved Maintain med doses F/up in 3months
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
## 2024-02-25 ENCOUNTER — Other Ambulatory Visit (HOSPITAL_COMMUNITY): Payer: Self-pay

## 2024-02-25 ENCOUNTER — Telehealth: Payer: Self-pay | Admitting: Pharmacy Technician

## 2024-02-25 DIAGNOSIS — I10 Essential (primary) hypertension: Secondary | ICD-10-CM

## 2024-02-25 DIAGNOSIS — E1169 Type 2 diabetes mellitus with other specified complication: Secondary | ICD-10-CM

## 2024-02-25 NOTE — Telephone Encounter (Signed)
 Pharmacy Patient Advocate Encounter   Received notification from Onbase that prior authorization for Mounjaro  5MG /0.5ML auto-injectors  is required/requested.   Insurance verification completed.   The patient is insured through [employer].   Per test claim: PA required; PA submitted to above mentioned insurance via Latent Key/confirmation #/EOC [id number] Status is pending

## 2024-02-26 MED ORDER — OZEMPIC (1 MG/DOSE) 4 MG/3ML ~~LOC~~ SOPN: 1.0000 mg | PEN_INJECTOR | SUBCUTANEOUS | 2 refills | Status: DC

## 2024-02-26 NOTE — Addendum Note (Signed)
 Addended by: KATHEEN GARDEN L on: 02/26/2024 06:00 PM   Modules accepted: Orders

## 2024-02-26 NOTE — Telephone Encounter (Signed)
 Pharmacy Patient Advocate Encounter  Received notification from [facility] [employer] that Prior Authorization for Mounjaro  5MG /0.5ML auto-injectors  has been DENIED.  Full denial letter will be uploaded to the media tab. See denial reason below.    **I could not find documentation that this patient has tried Metformin, or tried and failed at least 2 of the preferred medications with Medicaid. Preferred meds include: Ozempic , Trulicity, Victoza, and Byetta.**  PA #/Case ID/Reference #: [id number]

## 2024-03-02 ENCOUNTER — Telehealth: Payer: Self-pay

## 2024-03-02 NOTE — Telephone Encounter (Signed)
 Copied from CRM #[id number]. Topic: Clinical - Medication Prior Auth >> Mar 02, 2024  1:18 PM Franky GRADE wrote: Reason for CRM: Patient is calling because pharmacy received the prescription for Semaglutide , 1 MG/DOSE, (OZEMPIC , 1 MG/DOSE,) 4 MG/3ML SOPN [[id number]]; however, they require a prior authorization before being able to fill for the patient.

## 2024-03-02 NOTE — Telephone Encounter (Signed)
 Patient called and informed that Mounjaro  5 mg weekly was denied by her insurance company and that [place] changed her to Ozempic  1 mg weekly. Patient verbalized understanding and all (if any) questions were answered. Patient informed that the Ozempic  1 mg was sent to her [facility] located on [address] She thanked me for calling.

## 2024-03-02 NOTE — Telephone Encounter (Signed)
    Pt must trial and fail metformin before Ins will approve a GLP1

## 2024-03-07 MED ORDER — METFORMIN HCL ER 500 MG PO TB24: 500.0000 mg | ORAL_TABLET | Freq: Every day | ORAL | 1 refills | Status: AC

## 2024-03-07 NOTE — Telephone Encounter (Signed)
 It is a requirement per the insurance. Sometimes insurances will change what is required for approval of medications. GLP-1's have definitely had many changes within the year for prior approvals.

## 2024-03-07 NOTE — Assessment & Plan Note (Signed)
 Change in GLP-1RA coverage. Sent metformin in place

## 2024-03-07 NOTE — Addendum Note (Signed)
 Addended by: KATHEEN GARDEN L on: 03/07/2024 03:19 PM   Modules accepted: Orders

## 2024-03-08 NOTE — Telephone Encounter (Signed)
 Spoke with pt to inform her about the metformin prescription and D/C of GLP1 coverage by insurance. Advised my experience ome GI upset initially but should notice that subside. Advised to call with questions or concerns. Also texted link to reset [facility] password.

## 2024-03-08 NOTE — Telephone Encounter (Unsigned)
 Copied from CRM #[id number]. Topic: Clinical - Medication Prior Auth >> Mar 02, 2024  1:18 PM Franky GRADE wrote: Reason for CRM: Patient is calling because pharmacy received the prescription for Semaglutide , 1 MG/DOSE, (OZEMPIC , 1 MG/DOSE,) 4 MG/3ML SOPN [[id number]]; however, they require a prior authorization before being able to fill for the patient. >> Mar 08, 2024 10:53 AM Drema MATSU wrote: Relayed pcp message to patient regarding sending in Metformin.

## 2024-03-08 NOTE — Telephone Encounter (Signed)
 Per E2c2 note, the pt notified that metformin sent to pharmacy.

## 2024-03-09 ENCOUNTER — Ambulatory Visit: Admitting: Nurse Practitioner

## 2024-03-29 ENCOUNTER — Ambulatory Visit: Attending: Nurse Practitioner | Admitting: Physical Therapy

## 2024-03-29 NOTE — Therapy (Incomplete)
 " OUTPATIENT PHYSICAL THERAPY CERVICAL EVALUATION   Patient Name: Kimberly Underwood MRN: [id number] DOB:Jan 06, 1966, 58 y.o., female Today's Date: 03/29/2024  END OF SESSION:   Past Medical History:  Diagnosis Date   Anxiety    Arthritis 2014   CARPAL TUNNEL SYNDROME, BILATERAL 05/22/2010   Dental caries    Dyspnea on exertion 12/24/2021   Gout    History of bronchitis 10/25/2015   History of CT scan of head 07/2014   due to syncope, normal scan   History of echocardiogram 07/17/2014   TTE, normal LV function, 55-60% EF   History of EKG 07/2014   PVCs, otherwise normal   History of nuclear stress test 07/2014   normal   HYPERTENSION 10/25/2008   Impaired fasting blood sugar    Normal cardiac stress test 07/17/2014   Dr. Peter Jordan   Overactive bladder    Right lateral epicondylitis 03/09/2017   Vitamin D  deficiency    Wears glasses    Past Surgical History:  Procedure Laterality Date   ABDOMINAL HYSTERECTOMY  09/2010   fibroid, partial hysterectomy    TOENAIL EXCISION Bilateral 2000's   big toes (12/23/2012)   WISDOM TOOTH EXTRACTION  ~ 1985   Patient Active Problem List   Diagnosis Date Noted   Nonalcoholic fatty liver 07/14/2022   Hyperlipidemia associated with type 2 diabetes mellitus (HCC) 04/14/2022   Hives of unknown origin 01/07/2022   OSA (obstructive sleep apnea) 09/30/2021   History of hysterectomy 05/03/2021   DM (diabetes mellitus) (HCC) 07/14/2020   Class 3 severe obesity due to excess calories with serious comorbidity and body mass index (BMI) of 40.0 to 44.9 in adult (HCC) 07/14/2020   Degenerative arthritis of knee, bilateral 03/09/2017   Microscopic hematuria 10/25/2015   Overactive bladder 09/26/2014   Anxiety 09/26/2014   Arthritis    Gout    Hypertensive disorder 12/27/2013   Cervical dystonia 11/15/2012   CARPAL TUNNEL SYNDROME, BILATERAL 05/22/2010    PCP: Katheen, NP  REFERRING PROVIDER: Nche, NP  REFERRING DIAG: neck and shoulder  pain  THERAPY DIAG:  No diagnosis found.  Rationale for Evaluation and Treatment: Rehabilitation  ONSET DATE: 02/23/24  SUBJECTIVE:                                                                                                                                                                                                         SUBJECTIVE STATEMENT: *** Hand dominance: {MISC; OT HAND DOMINANCE:[id number]}  PERTINENT HISTORY:  Anxiety, arthritis, CTS, gout, HTN  PAIN:  Are you having pain? Yes:  NPRS scale: *** Pain location: *** Pain description: *** Aggravating factors: *** Relieving factors: ***  PRECAUTIONS: {Therapy precautions:24002}  RED FLAGS: {PT Red Flags:29287}     WEIGHT BEARING RESTRICTIONS: No  FALLS:  Has patient fallen in last 6 months? {fallsyesno:27318}  LIVING ENVIRONMENT: Lives with: lives with their family Lives in: House/apartment Stairs: {opstairs:27293} Has following equipment at home: {Assistive devices:23999}  OCCUPATION: ***  PLOF: {PLOF:24004}  PATIENT GOALS: ***  NEXT MD VISIT: ***  OBJECTIVE:  Note: Objective measures were completed at Evaluation unless otherwise noted.  DIAGNOSTIC FINDINGS:  ***  PATIENT SURVEYS:  {rehab surveys:24030}  COGNITION: Overall cognitive status: {cognition:24006}  SENSATION: {sensation:27233}  POSTURE: {posture:25561}  PALPATION: ***   CERVICAL ROM:   Active ROM A/PROM (deg) eval  Flexion   Extension   Right lateral flexion   Left lateral flexion   Right rotation   Left rotation    (Blank rows = not tested)  UPPER EXTREMITY ROM:  Active ROM Right eval Left eval  Shoulder flexion    Shoulder extension    Shoulder abduction    Shoulder adduction    Shoulder extension    Shoulder internal rotation    Shoulder external rotation    Elbow flexion    Elbow extension    Wrist flexion    Wrist extension    Wrist ulnar deviation    Wrist radial deviation    Wrist  pronation    Wrist supination     (Blank rows = not tested)  UPPER EXTREMITY MMT:  MMT Right eval Left eval  Shoulder flexion    Shoulder extension    Shoulder abduction    Shoulder adduction    Shoulder extension    Shoulder internal rotation    Shoulder external rotation    Middle trapezius    Lower trapezius    Elbow flexion    Elbow extension    Wrist flexion    Wrist extension    Wrist ulnar deviation    Wrist radial deviation    Wrist pronation    Wrist supination    Grip strength     (Blank rows = not tested)  CERVICAL SPECIAL TESTS:  {Cervical special tests:25246}  FUNCTIONAL TESTS:  {Functional tests:24029}  TREATMENT DATE: ***                                                                                                                                 PATIENT EDUCATION:  Education details: *** Person educated: {Person educated:25204} Education method: {Education Method:25205} Education comprehension: {Education Comprehension:25206}  HOME EXERCISE PROGRAM: ***  ASSESSMENT:  CLINICAL IMPRESSION: Patient is a 58 y.o. female who was seen today for physical therapy evaluation and treatment for ***.   OBJECTIVE IMPAIRMENTS: Abnormal gait, cardiopulmonary status limiting activity, decreased activity tolerance, decreased balance, decreased endurance, decreased mobility, difficulty walking, decreased ROM, decreased strength, increased fascial restrictions, increased muscle spasms, impaired flexibility, impaired UE functional use, improper  body mechanics, postural dysfunction, and pain.   REHAB POTENTIAL: Good  CLINICAL DECISION MAKING: Stable/uncomplicated  EVALUATION COMPLEXITY: Low   GOALS: Goals reviewed with patient? Yes  SHORT TERM GOALS: Target date: 04/29/24  Independent with initial HEP Baseline:  Goal status: INITIAL  LONG TERM GOALS: Target date: 06/27/24  Independent with advanced HEP Baseline:  Goal status: INITIAL  2.   Understand posture and body mechanics Baseline:  Goal status: INITIAL  3.  *** Baseline:  Goal status: INITIAL  4.  *** Baseline:  Goal status: INITIAL  5.  *** Baseline:  Goal status: INITIAL  6.  *** Baseline:  Goal status: INITIAL   PLAN:  PT FREQUENCY: {rehab frequency:25116}  PT DURATION: 12 weeks  PLANNED INTERVENTIONS: 97164- PT Re-evaluation, 97110-Therapeutic exercises, 97530- Therapeutic activity, 97112- Neuromuscular re-education, 97535- Self Care, 02859- Manual therapy, G0283- Electrical stimulation (unattended), 97035- Ultrasound, 02987- Traction (mechanical), Patient/Family education, Taping, Joint mobilization, Cryotherapy, and Moist heat  PLAN FOR NEXT SESSION: PIERRETTE OBADIAH OZELL LELON, PT 03/29/2024, 7:41 AM      "

## 2024-03-31 ENCOUNTER — Other Ambulatory Visit: Payer: Self-pay | Admitting: Nurse Practitioner

## 2024-03-31 DIAGNOSIS — S46812A Strain of other muscles, fascia and tendons at shoulder and upper arm level, left arm, initial encounter: Secondary | ICD-10-CM

## 2024-03-31 DIAGNOSIS — M25512 Pain in left shoulder: Secondary | ICD-10-CM

## 2024-04-06 NOTE — Telephone Encounter (Signed)
 Requesting: NAPROXEN  500 MG TABLET  Last Visit: 02/23/2024 Next Visit: 05/27/2024 Last Refill: 02/23/2024  Please Advise

## 2024-04-26 LAB — GLUCOSE, POCT (MANUAL RESULT ENTRY): Glucose Fasting, POC: 85 mg/dL (ref 70–99)

## 2024-05-09 ENCOUNTER — Other Ambulatory Visit: Payer: Self-pay | Admitting: Nurse Practitioner

## 2024-05-09 DIAGNOSIS — M25512 Pain in left shoulder: Secondary | ICD-10-CM

## 2024-05-09 DIAGNOSIS — S46812A Strain of other muscles, fascia and tendons at shoulder and upper arm level, left arm, initial encounter: Secondary | ICD-10-CM

## 2024-05-09 NOTE — Telephone Encounter (Signed)
 Refill request received for Naproxen  500mg  FOV: 05/27/24 LOV: 02/23/24 Last refill: 04/06/24 Medication is pending your approval.

## 2024-05-27 ENCOUNTER — Ambulatory Visit: Admitting: Nurse Practitioner

## 2024-07-20 ENCOUNTER — Telehealth: Admitting: Neurology
